# Patient Record
Sex: Male | Born: 1935 | Race: Black or African American | Hispanic: No | Marital: Married | Smoking: Never smoker
Health system: Southern US, Community
[De-identification: ages and names within clinical notes are randomized; demographics above are authoritative.]

## PROBLEM LIST (undated history)

## (undated) DIAGNOSIS — I5042 Chronic combined systolic (congestive) and diastolic (congestive) heart failure: Secondary | ICD-10-CM

## (undated) DIAGNOSIS — E785 Hyperlipidemia, unspecified: Secondary | ICD-10-CM

## (undated) DIAGNOSIS — I10 Essential (primary) hypertension: Secondary | ICD-10-CM

## (undated) DIAGNOSIS — I6529 Occlusion and stenosis of unspecified carotid artery: Secondary | ICD-10-CM

## (undated) DIAGNOSIS — G453 Amaurosis fugax: Secondary | ICD-10-CM

## (undated) HISTORY — DX: Essential (primary) hypertension: I10

## (undated) HISTORY — PX: OTHER SURGICAL HISTORY: SHX169

## (undated) HISTORY — DX: Chronic combined systolic (congestive) and diastolic (congestive) heart failure: I50.42

## (undated) HISTORY — DX: Amaurosis fugax: G45.3

## (undated) HISTORY — DX: Occlusion and stenosis of unspecified carotid artery: I65.29

## (undated) HISTORY — DX: Hyperlipidemia, unspecified: E78.5

## (undated) HISTORY — PX: EYE SURGERY: SHX253

---

## 1999-02-13 ENCOUNTER — Encounter: Payer: Self-pay | Admitting: Emergency Medicine

## 1999-02-13 ENCOUNTER — Emergency Department (HOSPITAL_COMMUNITY): Admission: EM | Admit: 1999-02-13 | Discharge: 1999-02-13 | Payer: Self-pay | Admitting: Emergency Medicine

## 2000-10-14 ENCOUNTER — Ambulatory Visit (HOSPITAL_COMMUNITY): Admission: RE | Admit: 2000-10-14 | Discharge: 2000-10-14 | Payer: Self-pay | Admitting: Gastroenterology

## 2011-06-25 ENCOUNTER — Ambulatory Visit
Admission: RE | Admit: 2011-06-25 | Discharge: 2011-06-25 | Disposition: A | Discharge status: Home / Self Care | Payer: Medicare Other | Source: Ambulatory Visit | Attending: Family Medicine | Admitting: Family Medicine

## 2011-06-25 ENCOUNTER — Other Ambulatory Visit: Payer: Self-pay | Admitting: Family Medicine

## 2011-06-25 DIAGNOSIS — M79669 Pain in unspecified lower leg: Secondary | ICD-10-CM

## 2012-01-12 ENCOUNTER — Other Ambulatory Visit: Payer: Self-pay | Admitting: Gastroenterology

## 2013-09-08 ENCOUNTER — Encounter (INDEPENDENT_AMBULATORY_CARE_PROVIDER_SITE_OTHER): Payer: Medicare Other | Admitting: Ophthalmology

## 2013-09-08 DIAGNOSIS — H2510 Age-related nuclear cataract, unspecified eye: Secondary | ICD-10-CM

## 2013-09-08 DIAGNOSIS — I10 Essential (primary) hypertension: Secondary | ICD-10-CM

## 2013-09-08 DIAGNOSIS — H3530 Unspecified macular degeneration: Secondary | ICD-10-CM

## 2013-09-08 DIAGNOSIS — H35039 Hypertensive retinopathy, unspecified eye: Secondary | ICD-10-CM

## 2013-09-08 DIAGNOSIS — H43819 Vitreous degeneration, unspecified eye: Secondary | ICD-10-CM

## 2013-09-08 DIAGNOSIS — H35379 Puckering of macula, unspecified eye: Secondary | ICD-10-CM

## 2014-03-15 ENCOUNTER — Ambulatory Visit (INDEPENDENT_AMBULATORY_CARE_PROVIDER_SITE_OTHER): Payer: Medicare Other | Admitting: Ophthalmology

## 2014-06-12 ENCOUNTER — Ambulatory Visit (INDEPENDENT_AMBULATORY_CARE_PROVIDER_SITE_OTHER): Payer: Medicare Other | Admitting: Family Medicine

## 2014-06-12 VITALS — BP 134/78 | HR 81 | Temp 97.8°F | Resp 18 | Ht 72.0 in | Wt 176.0 lb

## 2014-06-12 DIAGNOSIS — R0981 Nasal congestion: Secondary | ICD-10-CM

## 2014-06-12 MED ORDER — IPRATROPIUM BROMIDE 0.03 % NA SOLN: 2.0000 | Freq: Four times a day (QID) | NASAL | Status: DC

## 2014-06-12 MED ORDER — GUAIFENESIN 100 MG/5ML PO SOLN: 15.0000 mL | ORAL | Status: DC | PRN

## 2014-06-12 MED ORDER — AZITHROMYCIN 250 MG PO TABS: ORAL_TABLET | ORAL | Status: DC

## 2014-06-12 MED ORDER — HYDROCOD POLST-CHLORPHEN POLST 10-8 MG/5ML PO LQCR: 2.5000 mL | Freq: Every evening | ORAL | Status: DC | PRN

## 2014-06-12 MED ORDER — FLUTICASONE PROPIONATE 50 MCG/ACT NA SUSP: 2.0000 | Freq: Every day | NASAL | Status: DC

## 2014-06-12 NOTE — Progress Notes (Signed)
Subjective:  This chart was scribed for Dr. Norberto SorensonEva Ladashia Demarinis, MD by Jarvis Morganaylor Ferguson, ED Scribe. This patient was seen in Room 10 and the patient's care was started at 4:35 PM.   Patient ID: Stephen ReidJoe L Chavez, male    DOB: 1932-06-21, 80 y.o.   MRN: [id number]  Chief Complaint  Patient presents with  . Nasal Congestion    since wednesday     HPI HPI Comments: Stephen Chavez is a 80 y.o. male who presents to the [facility] complaining of nasal congestion for 5 days. He has had associated sneezing, sinus pressure, post nasal drip, body aches, mild sore throat, mild dry cough. He notes a touch of blood in the mucus when he blows his nose but not much. Pt has been using Mucinex and Afrin nasal spray with mild relief. Pt denies any trouble sleeping or his symptoms keeping him up at night. He denies any fever, chills, otalgia, or shortness of breath. Pt is a former smoker and has not smoker in over 20 years.   Past Medical History  Diagnosis Date  . Hypertension   . Hyperlipidemia    No current outpatient prescriptions on file prior to visit.   No current facility-administered medications on file prior to visit.   No Known Allergies    Review of Systems  Constitutional: Negative for fever and chills.  HENT: Positive for congestion, postnasal drip, rhinorrhea, sinus pressure, sneezing and sore throat (mild). Negative for ear pain and hearing loss.   Respiratory: Positive for cough (mild and non productive). Negative for shortness of breath.   Musculoskeletal: Positive for myalgias (generalized body aches).     Triage Vitals: BP 134/78 mmHg  Pulse 81  Temp(Src) 97.8 F (36.6 C) (Oral)  Resp 18  Ht 6' (1.829 m)  Wt 176 lb (79.833 kg)  BMI 23.86 kg/m2  SpO2 98%  Objective:   Physical Exam  Constitutional: He is oriented to person, place, and time. He appears well-developed and well-nourished. No distress.  HENT:  Head: Normocephalic and atraumatic.  Right Ear: Tympanic  membrane, external ear and ear canal normal.  Left Ear: Tympanic membrane, external ear and ear canal normal.  Nose: Mucosal edema present. Right sinus exhibits no maxillary sinus tenderness and no frontal sinus tenderness. Left sinus exhibits no maxillary sinus tenderness and no frontal sinus tenderness.  Mouth/Throat: Oropharynx is clear and moist. No oropharyngeal exudate, posterior oropharyngeal edema or posterior oropharyngeal erythema.  Nasal mucosa erythema with small amount of rhinitis.   Eyes: Conjunctivae and EOM are normal.  Neck: Neck supple. No tracheal deviation present. No thyroid mass and no thyromegaly present.  Cardiovascular: Normal rate, regular rhythm, S1 normal, S2 normal and normal heart sounds.   Pulmonary/Chest: Effort normal and breath sounds normal. No respiratory distress. He has no wheezes. He has no rhonchi. He has no rales.  Musculoskeletal: Normal range of motion.  Lymphadenopathy:    He has no cervical adenopathy.  Neurological: He is alert and oriented to person, place, and time.  Skin: Skin is warm and dry.  Psychiatric: He has a normal mood and affect. His behavior is normal.  Nursing note and vitals reviewed.      Assessment & Plan:   Nasal sinus congestion Try netti pot/sinus rinse along with flonase x 2 wks - suspect due to viral or allergies at this point but if develops sxs of bacterial sinus infection over next few days, then pt will fill paper rx for zpack.  Meds ordered this encounter  Medications  . aspirin EC 81 MG tablet    Sig: Take 81 mg by mouth daily.  Marland Kitchen. amLODipine (NORVASC) 10 MG tablet    Sig: Take 10 mg by mouth daily.  . pravastatin (PRAVACHOL) 40 MG tablet    Sig: Take 40 mg by mouth daily.  Marland Kitchen. ipratropium (ATROVENT) 0.03 % nasal spray    Sig: Place 2 sprays into the nose 4 (four) times daily.    Dispense:  30 mL    Refill:  0  . guaiFENesin (ROBITUSSIN) 100 MG/5ML SOLN    Sig: Take 15 mLs (300 mg total) by mouth every 4 (four)  hours as needed for to loosen phlegm.    Dispense:  1200 mL    Refill:  1  . chlorpheniramine-HYDROcodone (TUSSIONEX PENNKINETIC ER) 10-8 MG/5ML LQCR    Sig: Take 2.5 mLs by mouth at bedtime as needed.    Dispense:  45 mL    Refill:  0  . fluticasone (FLONASE) 50 MCG/ACT nasal spray    Sig: Place 2 sprays into both nostrils at bedtime.    Dispense:  16 g    Refill:  2  . azithromycin (ZITHROMAX) 250 MG tablet    Sig: Take 2 tabs PO x 1 dose, then 1 tab PO QD x 4 days    Dispense:  6 tablet    Refill:  0    I personally performed the services described in this documentation, which was scribed in my presence. The recorded information has been reviewed and considered, and addended by me as needed.  Norberto SorensonEva Mkenzie Dotts, MD MPH

## 2014-06-12 NOTE — Patient Instructions (Addendum)
Hot showers or breathing in steam may help loosen the congestion.  Using a netti pot or sinus rinse is also likely to help you feel better and keep this from progressing.  Use the atrovent nasal spray as needed throughout the day and use the fluticasone nasal spray every night before bed for at least 2 weeks.  I recommend augmenting generic mucinex to help you move out the congestion.  If no improvement or you are getting worse, come back as you might need a course of steroids but hopefully with all of the above, you can avoid it.  If you develop fevers, chills, or symptoms continue to persist after another 3 days, go ahead and fill the zpack (azithromycin antibiotic).   Upper Respiratory Infection, Adult An upper respiratory infection (URI) is also sometimes known as the common cold. The upper respiratory tract includes the nose, sinuses, throat, trachea, and bronchi. Bronchi are the airways leading to the lungs. Most people improve within 1 week, but symptoms can last up to 2 weeks. A residual cough may last even longer.  CAUSES Many different viruses can infect the tissues lining the upper respiratory tract. The tissues become irritated and inflamed and often become very moist. Mucus production is also common. A cold is contagious. You can easily spread the virus to others by oral contact. This includes kissing, sharing a glass, coughing, or sneezing. Touching your mouth or nose and then touching a surface, which is then touched by another person, can also spread the virus. SYMPTOMS  Symptoms typically develop 1 to 3 days after you come in contact with a cold virus. Symptoms vary from person to person. They may include:  Runny nose.  Sneezing.  Nasal congestion.  Sinus irritation.  Sore throat.  Loss of voice (laryngitis).  Cough.  Fatigue.  Muscle aches.  Loss of appetite.  Headache.  Low-grade fever. DIAGNOSIS  You might diagnose your own cold based on familiar symptoms, since  most people get a cold 2 to 3 times a year. Your caregiver can confirm this based on your exam. Most importantly, your caregiver can check that your symptoms are not due to another disease such as strep throat, sinusitis, pneumonia, asthma, or epiglottitis. Blood tests, throat tests, and X-rays are not necessary to diagnose a common cold, but they may sometimes be helpful in excluding other more serious diseases. Your caregiver will decide if any further tests are required. RISKS AND COMPLICATIONS  You may be at risk for a more severe case of the common cold if you smoke cigarettes, have chronic heart disease (such as heart failure) or lung disease (such as asthma), or if you have a weakened immune system. The very young and very old are also at risk for more serious infections. Bacterial sinusitis, middle ear infections, and bacterial pneumonia can complicate the common cold. The common cold can worsen asthma and chronic obstructive pulmonary disease (COPD). Sometimes, these complications can require emergency medical care and may be life-threatening. PREVENTION  The best way to protect against getting a cold is to practice good hygiene. Avoid oral or hand contact with people with cold symptoms. Wash your hands often if contact occurs. There is no clear evidence that vitamin C, vitamin E, echinacea, or exercise reduces the chance of developing a cold. However, it is always recommended to get plenty of rest and practice good nutrition. TREATMENT  Treatment is directed at relieving symptoms. There is no cure. Antibiotics are not effective, because the infection is caused by  a virus, not by bacteria. Treatment may include:  Increased fluid intake. Sports drinks offer valuable electrolytes, sugars, and fluids.  Breathing heated mist or steam (vaporizer or shower).  Eating chicken soup or other clear broths, and maintaining good nutrition.  Getting plenty of rest.  Using gargles or lozenges for  comfort.  Controlling fevers with ibuprofen or acetaminophen as directed by your caregiver.  Increasing usage of your inhaler if you have asthma. Zinc gel and zinc lozenges, taken in the first 24 hours of the common cold, can shorten the duration and lessen the severity of symptoms. Pain medicines may help with fever, muscle aches, and throat pain. A variety of non-prescription medicines are available to treat congestion and runny nose. Your caregiver can make recommendations and may suggest nasal or lung inhalers for other symptoms.  HOME CARE INSTRUCTIONS   Only take over-the-counter or prescription medicines for pain, discomfort, or fever as directed by your caregiver.  Use a warm mist humidifier or inhale steam from a shower to increase air moisture. This may keep secretions moist and make it easier to breathe.  Drink enough water and fluids to keep your urine clear or pale yellow.  Rest as needed.  Return to work when your temperature has returned to normal or as your caregiver advises. You may need to stay home longer to avoid infecting others. You can also use a face mask and careful hand washing to prevent spread of the virus. SEEK MEDICAL CARE IF:   After the first few days, you feel you are getting worse rather than better.  You need your caregiver's advice about medicines to control symptoms.  You develop chills, worsening shortness of breath, or brown or red sputum. These may be signs of pneumonia.  You develop yellow or brown nasal discharge or pain in the face, especially when you bend forward. These may be signs of sinusitis.  You develop a fever, swollen neck glands, pain with swallowing, or white areas in the back of your throat. These may be signs of strep throat. SEEK IMMEDIATE MEDICAL CARE IF:   You have a fever.  You develop severe or persistent headache, ear pain, sinus pain, or chest pain.  You develop wheezing, a prolonged cough, cough up blood, or have a  change in your usual mucus (if you have chronic lung disease).  You develop sore muscles or a stiff neck. Document Released: 11/26/2000 Document Revised: 08/25/2011 Document Reviewed: 09/07/2013 [facility] Patient Information 2015 [place], [place]. This information is not intended to replace advice given to you by your health care provider. Make sure you discuss any questions you have with your health care provider.

## 2016-05-15 ENCOUNTER — Other Ambulatory Visit: Payer: Self-pay | Admitting: Family Medicine

## 2016-05-15 DIAGNOSIS — H53121 Transient visual loss, right eye: Secondary | ICD-10-CM

## 2016-05-21 ENCOUNTER — Encounter (INDEPENDENT_AMBULATORY_CARE_PROVIDER_SITE_OTHER): Payer: Medicare Other | Admitting: Ophthalmology

## 2016-05-21 DIAGNOSIS — I10 Essential (primary) hypertension: Secondary | ICD-10-CM | POA: Diagnosis not present

## 2016-05-21 DIAGNOSIS — H5340 Unspecified visual field defects: Secondary | ICD-10-CM

## 2016-05-21 DIAGNOSIS — H43822 Vitreomacular adhesion, left eye: Secondary | ICD-10-CM

## 2016-05-21 DIAGNOSIS — H5319 Other subjective visual disturbances: Secondary | ICD-10-CM | POA: Diagnosis not present

## 2016-05-21 DIAGNOSIS — H353111 Nonexudative age-related macular degeneration, right eye, early dry stage: Secondary | ICD-10-CM | POA: Diagnosis not present

## 2016-05-21 DIAGNOSIS — H43813 Vitreous degeneration, bilateral: Secondary | ICD-10-CM | POA: Diagnosis not present

## 2016-05-21 DIAGNOSIS — H35033 Hypertensive retinopathy, bilateral: Secondary | ICD-10-CM | POA: Diagnosis not present

## 2016-05-23 ENCOUNTER — Ambulatory Visit
Admission: RE | Admit: 2016-05-23 | Discharge: 2016-05-23 | Disposition: A | Discharge status: Home / Self Care | Payer: Medicare Other | Source: Ambulatory Visit | Attending: Family Medicine | Admitting: Family Medicine

## 2016-05-23 DIAGNOSIS — H53121 Transient visual loss, right eye: Secondary | ICD-10-CM

## 2016-06-02 ENCOUNTER — Encounter (INDEPENDENT_AMBULATORY_CARE_PROVIDER_SITE_OTHER): Payer: Self-pay | Admitting: Ophthalmology

## 2016-06-05 DIAGNOSIS — G453 Amaurosis fugax: Secondary | ICD-10-CM | POA: Insufficient documentation

## 2016-08-02 ENCOUNTER — Ambulatory Visit (INDEPENDENT_AMBULATORY_CARE_PROVIDER_SITE_OTHER): Payer: Medicare Other | Admitting: Family Medicine

## 2016-08-02 VITALS — BP 120/60 | HR 77 | Temp 98.3°F | Resp 16 | Ht 72.0 in | Wt 177.0 lb

## 2016-08-02 DIAGNOSIS — J069 Acute upper respiratory infection, unspecified: Secondary | ICD-10-CM

## 2016-08-02 DIAGNOSIS — B9789 Other viral agents as the cause of diseases classified elsewhere: Secondary | ICD-10-CM

## 2016-08-02 DIAGNOSIS — H9319 Tinnitus, unspecified ear: Secondary | ICD-10-CM | POA: Diagnosis not present

## 2016-08-02 DIAGNOSIS — K5909 Other constipation: Secondary | ICD-10-CM | POA: Diagnosis not present

## 2016-08-02 LAB — POCT INFLUENZA A/B
INFLUENZA B, POC: NEGATIVE
Influenza A, POC: NEGATIVE

## 2016-08-02 MED ORDER — IPRATROPIUM BROMIDE 0.03 % NA SOLN: 2.0000 | Freq: Four times a day (QID) | NASAL | 0 refills | Status: DC

## 2016-08-02 MED ORDER — GUAIFENESIN-CODEINE 100-10 MG/5ML PO SOLN: 5.0000 mL | ORAL | 0 refills | Status: DC | PRN

## 2016-08-02 NOTE — Progress Notes (Signed)
By signing my name below, I, Mesha Guinyard, attest that this documentation has been prepared under the direction and in the presence of Norberto SorensonEva Shaw, MD.  Electronically Signed: Arvilla MarketMesha Guinyard, Medical Scribe. 08/02/16. 10:49 AM.  Subjective:    Patient ID: Stephen Chavez, male    DOB: [birth year], 83 y.o.   MRN: [id number]  HPI Chief Complaint  Patient presents with  . Cough    Hx of pneumonia    HPI Comments: Stephen Chavez is a 83 y.o. male with a PMHx of HTN and tobacco abuse who presents to the [facility] complaining of dry cough onset yesterday morning.  His [facility]CP [facility] [place]. Pt stopped smoking over 25 years prior.  Pt woke up yesterday with a cough which kept him up at night. Reports associated sxs of nasal congestion, frontal sinus pressure, sinus pain, and "feeling wierd". Pt notes having constipation, but suspects it due to a medication and has been taking benefiber for relief of his sxs. Took alka seltzer plus, and BC without relief of his sxs. Pt had his flu shot in Aug, and his PNA shot. He notes the lump on his neck was nl when checked out and it hasn't grown in size. Denies needing an inhaler or having sick contacts. Denies fever, chills, diaphoresis, HA, sore throat, postnasal drip, rhinorrhea, myalgias, arthralgias, difficulty urinating, nausea, and emesis.  Tinnitus: Report hearing a ringing for the past 6-8 months - isn't sure if it's bilateral of not. Pt's wife says he has hearing loss but pt isn't sure if it's true.  There are no active problems to display for this patient.  Past Medical History:  Diagnosis Date  . Hyperlipidemia   . Hypertension    Past Surgical History:  Procedure Laterality Date  . EYE SURGERY    . polyp removal     No Known Allergies Prior to Admission medications   Medication Sig Start Date End Date Taking? Authorizing Provider  amLODipine (NORVASC) 10 MG tablet Take 10 mg by mouth  daily.   Yes Historical Provider, MD  aspirin EC 81 MG tablet Take 81 mg by mouth daily.   Yes Historical Provider, MD  pravastatin (PRAVACHOL) 40 MG tablet Take 40 mg by mouth daily.   Yes Historical Provider, MD  chlorpheniramine-HYDROcodone (TUSSIONEX PENNKINETIC ER) 10-8 MG/5ML LQCR Take 2.5 mLs by mouth at bedtime as needed. Patient not taking: Reported on 08/02/2016 06/12/14   Sherren MochaEva N Shaw, MD   Social History   Social History  . Marital status: Married    Spouse name: N/A  . Number of children: N/A  . Years of education: N/A   Occupational History  . Not on file.   Social History Main Topics  . Smoking status: Never Smoker  . Smokeless tobacco: Never Used  . Alcohol use 2.4 oz/week    4 Standard drinks or equivalent per week  . Drug use: No  . Sexual activity: Not on file   Other Topics Concern  . Not on file   Social History Narrative  . No narrative on file   Depression screen [facility] 2/9 08/02/2016  Decreased Interest 0  Down, Depressed, Hopeless 0  PHQ - 2 Score 0    Review of Systems  Constitutional: Negative for chills, diaphoresis and fever.  HENT: Positive for sinus pain, sinus pressure and tinnitus. Negative for hearing loss, postnasal drip, rhinorrhea and sore throat.   Respiratory: Positive for cough.  Gastrointestinal: Positive for constipation. Negative for nausea and vomiting.  Genitourinary: Negative for difficulty urinating.  Musculoskeletal: Negative for arthralgias and myalgias.  Neurological: Negative for headaches.  Psychiatric/Behavioral: Positive for sleep disturbance.   Objective:  Physical Exam  Constitutional: He appears well-developed and well-nourished. No distress.  HENT:  Head: Normocephalic and atraumatic.  Right Ear: Tympanic membrane, external ear and ear canal normal.  Left Ear: Tympanic membrane, external ear and ear canal normal.  Nose: Nose normal.  Mouth/Throat: Oropharynx is clear and moist.  Eyes: Conjunctivae are normal.    Neck: Neck supple. No thyromegaly present.  Right posterior mass not adherent not overlying skin  Does feel adhering to underlying solid tissue but is soft, well defined, approx 1 cm diameter consistent with lymph node in posterior chain vs cyst  Cardiovascular: Normal rate, regular rhythm, S1 normal, S2 normal and normal heart sounds.   Pulmonary/Chest: Effort normal.  Lymphadenopathy:    He has no cervical adenopathy.  Neurological: He is alert.  Skin: Skin is warm and dry.  Psychiatric: He has a normal mood and affect. His behavior is normal.  Nursing note and vitals reviewed.  BP 120/60   Pulse 77   Temp 98.3 F (36.8 C) (Oral)   Resp 16   Ht 6' (1.829 m)   Wt 177 lb (80.3 kg)   SpO2 97%   BMI 24.01 kg/m    Results for orders placed or performed in visit on 08/02/16  POCT Influenza A/B  Result Value Ref Range   Influenza A, POC Negative Negative   Influenza B, POC Negative Negative   Assessment & Plan:   1. Viral upper respiratory tract infection   2. Tinnitus, unspecified laterality   3. Chronic constipation     Orders Placed This Encounter  Procedures  . Ambulatory referral to ENT    Referral Priority:   Routine    Referral Type:   Consultation    Referral Reason:   Specialty Services Required    Requested Specialty:   Otolaryngology    Number of Visits Requested:   1  . POCT Influenza A/B    Meds ordered this encounter  Medications  . ipratropium (ATROVENT) 0.03 % nasal spray    Sig: Place 2 sprays into the nose 4 (four) times daily.    Dispense:  30 mL    Refill:  0  . guaiFENesin-codeine 100-10 MG/5ML syrup    Sig: Take 5-10 mLs by mouth every 4 (four) hours as needed for cough.    Dispense:  180 mL    Refill:  0    I personally performed the services described in this documentation, which was scribed in my presence. The recorded information has been reviewed and considered, and addended by me as needed.   Norberto Sorenson, M.D.  [facility] at [facility] [address] [place], [place] [address] [phone] phone [phone] fax  08/20/16 4:58 AM

## 2016-08-02 NOTE — Patient Instructions (Addendum)
Restart the ipratropium nasal spray 4 times day.   The cough syrup has some an expectorant to help loosen up the congestion (same as in Mucinex or Robitussen) combined with a pain medication that will help you sleep, treat headache from cough, and suppress cough.  I recommend frequent warm salt water gargles, hot tea with honey and lemon, rest, and handwashing.  Hot showers or breathing in steam may help loosen the congestion.  Try frequent nasal saline throughout the day also likely to help you feel better and keep this from progressing.  Use tylenol/acetaminophen as needed for sinus pressure/pain or headache.    IF you received an x-ray today, you will receive an invoice from [facility]. Please contact [facility] at [phone] with questions or concerns regarding your invoice.   IF you received labwork today, you will receive an invoice from [facility]. Please contact [facility] at [phone] with questions or concerns regarding your invoice.   Our billing staff will not be able to assist you with questions regarding bills from these companies.  You will be contacted with the lab results as soon as they are available. The fastest way to get your results is to activate your My Chart account. Instructions are located on the last page of this paperwork. If you have not heard from us regarding the results in 2 weeks, please contact this office.     Upper Respiratory Infection, Adult Most upper respiratory infections (URIs) are a viral infection of the air passages leading to the lungs. A URI affects the nose, throat, and upper air passages. The most common type of URI is nasopharyngitis and is typically referred to as "the common cold." URIs run their course and usually go away on their own. Most of the time, a URI does not require medical attention, but sometimes a bacterial infection in the upper airways can follow a viral infection. This is called a secondary infection. Sinus  and middle ear infections are common types of secondary upper respiratory infections. Bacterial pneumonia can also complicate a URI. A URI can worsen asthma and chronic obstructive pulmonary disease (COPD). Sometimes, these complications can require emergency medical care and may be life threatening. What are the causes? Almost all URIs are caused by viruses. A virus is a type of germ and can spread from one person to another. What increases the risk? You may be at risk for a URI if:  You smoke.  You have chronic heart or lung disease.  You have a weakened defense (immune) system.  You are very young or very old.  You have nasal allergies or asthma.  You work in crowded or poorly ventilated areas.  You work in health care facilities or schools. What are the signs or symptoms? Symptoms typically develop 2-3 days after you come in contact with a cold virus. Most viral URIs last 7-10 days. However, viral URIs from the influenza virus (flu virus) can last 14-18 days and are typically more severe. Symptoms may include:  Runny or stuffy (congested) nose.  Sneezing.  Cough.  Sore throat.  Headache.  Fatigue.  Fever.  Loss of appetite.  Pain in your forehead, behind your eyes, and over your cheekbones (sinus pain).  Muscle aches. How is this diagnosed? Your health care provider may diagnose a URI by:  Physical exam.  Tests to check that your symptoms are not due to another condition such as:  Strep throat.  Sinusitis.  Pneumonia.  Asthma. How is this treated? A URI goes  away on its own with time. It cannot be cured with medicines, but medicines may be prescribed or recommended to relieve symptoms. Medicines may help:  Reduce your fever.  Reduce your cough.  Relieve nasal congestion. Follow these instructions at home:  Take medicines only as directed by your health care provider.  Gargle warm saltwater or take cough drops to comfort your throat as directed  by your health care provider.  Use a warm mist humidifier or inhale steam from a shower to increase air moisture. This may make it easier to breathe.  Drink enough fluid to keep your urine clear or pale yellow.  Eat soups and other clear broths and maintain good nutrition.  Rest as needed.  Return to work when your temperature has returned to normal or as your health care provider advises. You may need to stay home longer to avoid infecting others. You can also use a face mask and careful hand washing to prevent spread of the virus.  Increase the usage of your inhaler if you have asthma.  Do not use any tobacco products, including cigarettes, chewing tobacco, or electronic cigarettes. If you need help quitting, ask your health care provider. How is this prevented? The best way to protect yourself from getting a cold is to practice good hygiene.  Avoid oral or hand contact with people with cold symptoms.  Wash your hands often if contact occurs. There is no clear evidence that vitamin C, vitamin E, echinacea, or exercise reduces the chance of developing a cold. However, it is always recommended to get plenty of rest, exercise, and practice good nutrition. Contact a health care provider if:  You are getting worse rather than better.  Your symptoms are not controlled by medicine.  You have chills.  You have worsening shortness of breath.  You have brown or red mucus.  You have yellow or brown nasal discharge.  You have pain in your face, especially when you bend forward.  You have a fever.  You have swollen neck glands.  You have pain while swallowing.  You have white areas in the back of your throat. Get help right away if:  You have severe or persistent:  Headache.  Ear pain.  Sinus pain.  Chest pain.  You have chronic lung disease and any of the following:  Wheezing.  Prolonged cough.  Coughing up blood.  A change in your usual mucus.  You have a  stiff neck.  You have changes in your:  Vision.  Hearing.  Thinking.  Mood. This information is not intended to replace advice given to you by your health care provider. Make sure you discuss any questions you have with your health care provider. Document Released: 11/26/2000 Document Revised: 02/03/2016 Document Reviewed: 09/07/2013 Elsevier Interactive Patient Education  2017 [employer].

## 2016-08-15 ENCOUNTER — Other Ambulatory Visit: Payer: Self-pay | Admitting: Family Medicine

## 2016-08-18 ENCOUNTER — Telehealth: Payer: Self-pay

## 2016-08-18 ENCOUNTER — Other Ambulatory Visit: Payer: Self-pay | Admitting: Family Medicine

## 2016-08-18 ENCOUNTER — Telehealth: Payer: Self-pay | Admitting: Cardiovascular Disease

## 2016-08-18 DIAGNOSIS — G453 Amaurosis fugax: Secondary | ICD-10-CM

## 2016-08-18 NOTE — Telephone Encounter (Signed)
Notes faxed to [facility] on 08/15/16

## 2016-08-18 NOTE — Telephone Encounter (Signed)
Received records from [facility] for appointment on 09/03/16 with Dr [place].  Records put with Dr Scott's schedule for 09/03/16. lp °

## 2016-09-03 ENCOUNTER — Encounter: Payer: Self-pay | Admitting: Cardiovascular Disease

## 2016-09-03 ENCOUNTER — Ambulatory Visit (INDEPENDENT_AMBULATORY_CARE_PROVIDER_SITE_OTHER): Payer: Medicare Other | Admitting: Cardiovascular Disease

## 2016-09-03 VITALS — BP 134/72 | HR 65 | Ht 72.0 in | Wt 176.0 lb

## 2016-09-03 DIAGNOSIS — G453 Amaurosis fugax: Secondary | ICD-10-CM

## 2016-09-03 DIAGNOSIS — I10 Essential (primary) hypertension: Secondary | ICD-10-CM

## 2016-09-03 DIAGNOSIS — G459 Transient cerebral ischemic attack, unspecified: Secondary | ICD-10-CM | POA: Diagnosis not present

## 2016-09-03 DIAGNOSIS — I6529 Occlusion and stenosis of unspecified carotid artery: Secondary | ICD-10-CM

## 2016-09-03 DIAGNOSIS — I6523 Occlusion and stenosis of bilateral carotid arteries: Secondary | ICD-10-CM | POA: Diagnosis not present

## 2016-09-03 HISTORY — DX: Amaurosis fugax: G45.3

## 2016-09-03 HISTORY — DX: Occlusion and stenosis of unspecified carotid artery: I65.29

## 2016-09-03 HISTORY — DX: Essential (primary) hypertension: I10

## 2016-09-03 MED ORDER — AMLODIPINE BESYLATE 5 MG PO TABS
7.5000 mg | ORAL_TABLET | Freq: Every day | ORAL | 3 refills | Status: DC
Start: 2016-09-03 — End: 2017-11-11

## 2016-09-03 NOTE — Progress Notes (Signed)
Cardiology Office Note   Date:  09/03/2016   ID:  Stephen Chavez, DOB 26-Aug-1932, MRN [id number]  PCP:  Stephen HillierLITTLE,KEVIN LORNE, MD  Cardiologist:   Stephen Siiffany Sequoyah, MD   Chief Complaint  Patient presents with  . New Patient (Initial Visit)    Pt states no Sx.      History of Present Illness: Stephen Chavez is a 83 y.o. male with hypertension, hyperlipidemia, mild carotid stenosis, amaurosis fugax, and Sickle cell trait who presents for cardiovascular evaluation.  Stephen Chavez had transient monocular visual loss in his right eye 05/2016. The episode lasted several minutes. Since then it is have been 3 or 4 times in the same eye. He has been seen by an ophthalmologist and no pathology was noted.  His symptoms were felt to be due to amarourosis fugax and TIA evaluation was recommended.  He had carotid Dopplers that revealed mild carotid stenosis bilaterally.  Aspirin was increased from 81 mg 325 mg daily. He followed up with his PCP, Stephen Chavez, and was referred to cardiology for further evaluation.  Stephen Chavez has been feeling well. He denies chest pain. He has slight shortness of breath with exertion, but he attributes this to his age. He also complains of sinus congestion and drainage. He denies lower extremity edema, orthopnea, or PND. He quit smoking in 1987. Of note, his mother died of bleeding while on Coumadin. He is unsure why she was on this medication. He notes that he sometimes feels unsteady on his feet, especially with changes in position. At his last doctor's appointment amlodipine was reduced from 10 mg to 5 mg due to mild orthostasis. He notes that since this time his blood pressure has been elevated, mostly in the 150s systolic.   Past Medical History:  Diagnosis Date  . Amaurosis fugax of right eye 09/03/2016  . Carotid stenosis 09/03/2016   Mild 05/2016  . Essential hypertension 09/03/2016  . Hyperlipidemia   . Hypertension     Past Surgical History:  Procedure  Laterality Date  . EYE SURGERY    . polyp removal       Current Outpatient Prescriptions  Medication Sig Dispense Refill  . amLODipine (NORVASC) 5 MG tablet Take 1.5 tablets (7.5 mg total) by mouth daily. 135 tablet 3  . aspirin EC 81 MG tablet Take 81 mg by mouth 3 (three) times daily.     . pravastatin (PRAVACHOL) 40 MG tablet Take 40 mg by mouth daily.     No current facility-administered medications for this visit.     Allergies:   Patient has no known allergies.    Social History:  The patient  reports that he has never smoked. He has never used smokeless tobacco. He reports that he drinks about 2.4 oz of alcohol per week . He reports that he does not use drugs.   Family History:  The patient's family history is not on file.    ROS:  Please see the history of present illness.   Otherwise, review of systems are positive for none.   All other systems are reviewed and negative.    PHYSICAL EXAM: VS:  BP 134/72   Pulse 65   Ht 6' (1.829 m)   Wt 79.8 kg (176 lb)   BMI 23.87 kg/m  , BMI Body mass index is 23.87 kg/m. GENERAL:  Well appearing HEENT:  Pupils equal round and reactive, fundi not visualized, oral mucosa unremarkable NECK:  No jugular venous distention, waveform  within normal limits, carotid upstroke brisk and symmetric, no bruits, no thyromegaly LYMPHATICS:  No cervical adenopathy LUNGS:  Clear to auscultation bilaterally HEART:  RRR.  PMI not displaced or sustained,S1 and S2 within normal limits, no S3, no S4, no clicks, no rubs, no murmurs ABD:  Flat, positive bowel sounds normal in frequency in pitch, no bruits, no rebound, no guarding, no midline pulsatile mass, no hepatomegaly, no splenomegaly EXT:  2 plus pulses throughout, no edema, no cyanosis no clubbing SKIN:  No rashes no nodules NEURO:  Cranial nerves II through XII grossly intact, motor grossly intact throughout PSYCH:  Cognitively intact, oriented to person place and time    EKG:  EKG is  ordered today. The ekg ordered today demonstrates sinus rhythm.  Rate 65 bpm.   Recent Labs: No results found for requested labs within last 8760 hours.    Lipid Panel No results found for: CHOL, TRIG, HDL, CHOLHDL, VLDL, LDLCALC, LDLDIRECT    Wt Readings from Last 3 Encounters:  09/03/16 79.8 kg (176 lb)  08/02/16 80.3 kg (177 lb)  06/12/14 79.8 kg (176 lb)      ASSESSMENT AND PLAN:  # Transient monocular blindness:  # Hyperlipidemia: Stephen Chavez has experienced several episodes of transient blindness in the same eye. It is unlikely that this is due to an embolic source. This typically would not occur with the same exact presentation each time. We will obtain a transthoracic echo with saline microcavitation study. He has an MRI pending. If his MRI is consistent with ischemic strokes we will order a TEE.  We will also obtain a 30 day event monitor to evaluate for atrial fibrillation. He artery had carotid Dopplers checked that showed moderate disease bilaterally. He is on aspirin and statin. His LDL goal should be less than 70. His most recent lipids are not available at this time.   # Hypertension: Blood pressure is poorly-controlled today. He has struggled with some orthostasis at home. His blood pressure was low when on 10 mg of amlodipine. We will increase the dose from 5 mg to 7.5 mg daily.    Current medicines are reviewed at length with the patient today.  The patient does not have concerns regarding medicines.  The following changes have been made:  no change  Labs/ tests ordered today include:   Orders Placed This Encounter  Procedures  . Cardiac event monitor  . EKG 12-Lead  . ECHOCARDIOGRAM COMPLETE BUBBLE STUDY     Disposition:   FU with Stephen Chavez C. Duke Salvia, MD, [facility] in 2 months.     This note was written with the assistance of speech recognition software.  Please excuse any transcriptional errors.  Signed, Stephen Chavez C. Duke Salvia, MD, [facility] [place]  09/03/2016 5:22 PM     [facility]

## 2016-09-03 NOTE — Patient Instructions (Addendum)
Medication Instructions: Amlodipine has been decreased to 7.5 mg (one and half tablets) daily   Procedures/Testing: Your physician has recommended that you wear an 30 day event monitor. Event monitors are medical devices that record the heart's electrical activity. Doctors most often us these monitors to diagnose arrhythmias. Arrhythmias are problems with the speed or rhythm of the heartbeat. The monitor is a small, portable device. You can wear one while you do your normal daily activities. This is usually used to diagnose what is causing palpitations/syncope (passing out). This will be [address] at [address]   Your physician has requested that you have an echocardiogram bubble study. Echocardiography is a painless test that uses sound waves to create images of your heart. It provides your doctor with information about the size and shape of your heart and how well your heart's chambers and valves are working. This procedure takes approximately one hour. There are no restrictions for this procedure.     Follow up after the studies have been completed   If you need a refill on your cardiac medications before your next appointment, please call your pharmacy.

## 2016-09-18 ENCOUNTER — Other Ambulatory Visit: Payer: Self-pay | Admitting: Family Medicine

## 2016-09-18 DIAGNOSIS — Z77018 Contact with and (suspected) exposure to other hazardous metals: Secondary | ICD-10-CM

## 2016-09-19 ENCOUNTER — Ambulatory Visit
Admission: RE | Admit: 2016-09-19 | Discharge: 2016-09-19 | Disposition: A | Discharge status: Home / Self Care | Payer: Medicare Other | Source: Ambulatory Visit | Attending: Family Medicine | Admitting: Family Medicine

## 2016-09-19 DIAGNOSIS — G453 Amaurosis fugax: Secondary | ICD-10-CM

## 2016-09-19 DIAGNOSIS — Z77018 Contact with and (suspected) exposure to other hazardous metals: Secondary | ICD-10-CM

## 2016-09-23 ENCOUNTER — Ambulatory Visit (INDEPENDENT_AMBULATORY_CARE_PROVIDER_SITE_OTHER): Payer: Medicare Other

## 2016-09-23 ENCOUNTER — Other Ambulatory Visit: Payer: Self-pay

## 2016-09-23 ENCOUNTER — Ambulatory Visit (HOSPITAL_COMMUNITY): Payer: Medicare Other | Attending: Internal Medicine

## 2016-09-23 ENCOUNTER — Other Ambulatory Visit: Payer: Self-pay | Admitting: Cardiovascular Disease

## 2016-09-23 DIAGNOSIS — I119 Hypertensive heart disease without heart failure: Secondary | ICD-10-CM | POA: Insufficient documentation

## 2016-09-23 DIAGNOSIS — G453 Amaurosis fugax: Secondary | ICD-10-CM

## 2016-09-23 DIAGNOSIS — D573 Sickle-cell trait: Secondary | ICD-10-CM | POA: Insufficient documentation

## 2016-09-23 DIAGNOSIS — I4891 Unspecified atrial fibrillation: Secondary | ICD-10-CM | POA: Diagnosis not present

## 2016-09-23 DIAGNOSIS — I6523 Occlusion and stenosis of bilateral carotid arteries: Secondary | ICD-10-CM | POA: Diagnosis not present

## 2016-09-23 DIAGNOSIS — G459 Transient cerebral ischemic attack, unspecified: Secondary | ICD-10-CM

## 2016-09-23 DIAGNOSIS — I358 Other nonrheumatic aortic valve disorders: Secondary | ICD-10-CM | POA: Diagnosis not present

## 2016-09-23 DIAGNOSIS — Z8673 Personal history of transient ischemic attack (TIA), and cerebral infarction without residual deficits: Secondary | ICD-10-CM | POA: Diagnosis present

## 2016-09-23 DIAGNOSIS — I361 Nonrheumatic tricuspid (valve) insufficiency: Secondary | ICD-10-CM | POA: Insufficient documentation

## 2016-10-08 ENCOUNTER — Telehealth: Payer: Self-pay | Admitting: *Deleted

## 2016-10-08 DIAGNOSIS — R9430 Abnormal result of cardiovascular function study, unspecified: Secondary | ICD-10-CM

## 2016-10-08 DIAGNOSIS — I517 Cardiomegaly: Secondary | ICD-10-CM

## 2016-10-08 DIAGNOSIS — I071 Rheumatic tricuspid insufficiency: Secondary | ICD-10-CM

## 2016-10-08 NOTE — Telephone Encounter (Signed)
-----   Message from Chilton Si, MD sent at 10/05/2016  9:45 PM EDT ----- Echo shows that his heart squeezes well but does not relax completely.  This is a mild change and will not cause symptoms unless it worsens.  It will be important to keep his blood pressure under good control. Moderate leaking of the tricuspid valve.  Heart muscle is a little weak.  We should get a Lexiscan Myoview to make sure he doesn't have blockages in the heart arteries.

## 2016-10-08 NOTE — Telephone Encounter (Signed)
Advised patient of results  Sent to scheduling to arrange [facility]

## 2016-10-09 ENCOUNTER — Telehealth: Payer: Self-pay | Admitting: Cardiovascular Disease

## 2016-10-09 NOTE — Telephone Encounter (Signed)
Called the patient and left a VM with my name and number to call me back to schedule his stress test.

## 2016-10-14 ENCOUNTER — Telehealth (HOSPITAL_COMMUNITY): Payer: Self-pay

## 2016-10-14 NOTE — Telephone Encounter (Signed)
Encounter complete. 

## 2016-10-15 ENCOUNTER — Telehealth (HOSPITAL_COMMUNITY): Payer: Self-pay

## 2016-10-15 NOTE — Telephone Encounter (Signed)
Encounter complete. 

## 2016-10-16 ENCOUNTER — Ambulatory Visit (HOSPITAL_COMMUNITY)
Admission: RE | Admit: 2016-10-16 | Discharge: 2016-10-16 | Disposition: A | Discharge status: Home / Self Care | Payer: Medicare Other | Source: Ambulatory Visit | Attending: Cardiology | Admitting: Cardiology

## 2016-10-16 DIAGNOSIS — I071 Rheumatic tricuspid insufficiency: Secondary | ICD-10-CM | POA: Diagnosis not present

## 2016-10-16 DIAGNOSIS — R9430 Abnormal result of cardiovascular function study, unspecified: Secondary | ICD-10-CM | POA: Diagnosis not present

## 2016-10-16 DIAGNOSIS — I517 Cardiomegaly: Secondary | ICD-10-CM

## 2016-10-16 LAB — MYOCARDIAL PERFUSION IMAGING
CHL CUP NUCLEAR SDS: 4
CHL CUP NUCLEAR SRS: 6
CHL CUP NUCLEAR SSS: 8
LV dias vol: 125 mL (ref 62–150)
LV sys vol: 63 mL
NUC STRESS TID: 0.85
Peak HR: 82 {beats}/min
Rest HR: 60 {beats}/min

## 2016-10-16 MED ORDER — TECHNETIUM TC 99M TETROFOSMIN IV KIT
10.6000 | PACK | Freq: Once | INTRAVENOUS | Status: AC | PRN
  Administered 2016-10-16: 10.6 via INTRAVENOUS
  Filled 2016-10-16: qty 11

## 2016-10-16 MED ORDER — TECHNETIUM TC 99M TETROFOSMIN IV KIT
30.4000 | PACK | Freq: Once | INTRAVENOUS | Status: AC | PRN
  Administered 2016-10-16: 30.4 via INTRAVENOUS
  Filled 2016-10-16: qty 31

## 2016-10-16 MED ORDER — REGADENOSON 0.4 MG/5ML IV SOLN
0.4000 mg | Freq: Once | INTRAVENOUS | Status: AC
  Administered 2016-10-16: 0.4 mg via INTRAVENOUS

## 2016-10-23 ENCOUNTER — Encounter: Payer: Self-pay | Admitting: Cardiovascular Disease

## 2016-10-23 ENCOUNTER — Ambulatory Visit (INDEPENDENT_AMBULATORY_CARE_PROVIDER_SITE_OTHER): Payer: Medicare Other | Admitting: Cardiovascular Disease

## 2016-10-23 VITALS — BP 136/73 | HR 74 | Ht 72.0 in | Wt 172.8 lb

## 2016-10-23 DIAGNOSIS — I5042 Chronic combined systolic (congestive) and diastolic (congestive) heart failure: Secondary | ICD-10-CM | POA: Diagnosis not present

## 2016-10-23 DIAGNOSIS — I5032 Chronic diastolic (congestive) heart failure: Secondary | ICD-10-CM | POA: Insufficient documentation

## 2016-10-23 DIAGNOSIS — G453 Amaurosis fugax: Secondary | ICD-10-CM | POA: Diagnosis not present

## 2016-10-23 DIAGNOSIS — I10 Essential (primary) hypertension: Secondary | ICD-10-CM

## 2016-10-23 DIAGNOSIS — E7800 Pure hypercholesterolemia, unspecified: Secondary | ICD-10-CM | POA: Diagnosis not present

## 2016-10-23 HISTORY — DX: Chronic combined systolic (congestive) and diastolic (congestive) heart failure: I50.42

## 2016-10-23 NOTE — Patient Instructions (Signed)
Medication Instructions:  ?Your physician recommends that you continue on your current medications as directed. Please refer to the Current Medication list given to you today.  ? ?Labwork: ?NONE ? ?Testing/Procedures: ?NONE ? ?Follow-Up: ?AS NEEDED  ? ?  ?

## 2016-10-23 NOTE — Progress Notes (Signed)
Cardiology Office Note   Date:  10/23/2016   ID:  Stephen Chavez, DOB 05-11-1933, MRN [id number]  PCP:  [name], MD  Cardiologist:   Chilton Si, MD   Chief Complaint  Patient presents with  . Follow-up    Pt states no Sx.       History of Present Illness: Stephen Chavez is a 83 y.o. male with hypertension, hyperlipidemia, mild carotid stenosis, amaurosis fugax, and Sickle cell trait who presents for follow up.  He was initially seen 08/2016 after several episodes of  transient monocular visual loss in his right eye.  The first episode occurred 05/2016.  He has been seen by an ophthalmologist and no pathology was noted.  His symptoms were felt to be due to amarourosis fugax and TIA evaluation was recommended.  He had carotid Dopplers that revealed mild carotid stenosis bilaterally.  Aspirin was increased from 81 mg 325 mg daily. He had an echo 09/23/16 that revealed LDH 45-50% with severe septal hypertrophy and grade 1 diastolic dysfunction. He also had moderate tricuspid regurgitation. He subsequently had a Lexiscan Myoview 10/16/16 that was negative for ischemia.  He wore 30 day event monitor that did not reveal any arrhythmias.      [name] has been doing well. He denies any chest pain or shortness of breath. Since his last appointment he had one episode 2 or 3 weeks ago where he felt like he was about to lose vision in his eye but it did not occur. He has not noted any chest pain or shortness of breath. He also denies lower extremity edema, orthopnea, or PND.  He walks for exercise, but not regularly.  He denies exertional symptoms, lower extremity edema, orthopnea, or PND.  Past Medical History:  Diagnosis Date  . Amaurosis fugax of right eye 09/03/2016  . Carotid stenosis 09/03/2016   Mild 05/2016  . Chronic combined systolic and diastolic heart failure (HCC) 10/23/2016  . Essential hypertension 09/03/2016  . Hyperlipidemia   . Hypertension     Past Surgical History:    Procedure Laterality Date  . EYE SURGERY    . polyp removal       Current Outpatient Prescriptions  Medication Sig Dispense Refill  . amLODipine (NORVASC) 5 MG tablet Take 1.5 tablets (7.5 mg total) by mouth daily. 135 tablet 3  . aspirin EC 81 MG tablet Take 81 mg by mouth 3 (three) times daily.     . pravastatin (PRAVACHOL) 40 MG tablet Take 40 mg by mouth daily.     No current facility-administered medications for this visit.     Allergies:   Patient has no known allergies.    Social History:  The patient  reports that he has never smoked. He has never used smokeless tobacco. He reports that he drinks about 2.4 oz of alcohol per week . He reports that he does not use drugs.   Family History:  The patient's family history is not on file.    ROS:  Please see the history of present illness.   Otherwise, review of systems are positive for none.   All other systems are reviewed and negative.    PHYSICAL EXAM: VS:  BP 136/73   Pulse 74   Ht 6' (1.829 m)   Wt 78.4 kg (172 lb 12.8 oz)   BMI 23.44 kg/m  , BMI Body mass index is 23.44 kg/m. GENERAL:  Well appearing.  No acute distress HEENT:  Pupils equal round  and reactive, fundi not visualized, oral mucosa unremarkable NECK:  No jugular venous distention, waveform within normal limits, carotid upstroke brisk and symmetric, no bruits LUNGS:  Clear to auscultation bilaterally.  No crackles, wheezes, or rhonchi HEART:  RRR.  PMI not displaced or sustained,S1 and S2 within normal limits, no S3, no S4, no clicks, no rubs, no murmurs ABD:  Flat, positive bowel sounds normal in frequency in pitch, no bruits, no rebound, no guarding, no midline pulsatile mass, no hepatomegaly, no splenomegaly EXT:  2 plus pulses throughout, no edema, no cyanosis no clubbing SKIN:  No rashes no nodules NEURO:  Cranial nerves II through XII grossly intact, motor grossly intact throughout PSYCH:  Cognitively intact, oriented to person place and  time   Echo 09/23/16: Study Conclusions  - Left ventricle: The cavity size was normal. Wall thickness was   increased in a pattern of moderate LVH. There was severe focal   basal hypertrophy of the septum. Systolic function was mildly   reduced. The estimated ejection fraction was in the range of 45%   to 50%. Diffuse hypokinesis. Doppler parameters are consistent   with abnormal left ventricular relaxation (grade 1 diastolic   dysfunction). The E/e&' ratio is between 8-15, suggesting   indeterminate LV filling pressure. - Aortic valve: Trileaflet. Sclerosis without stenosis. There was   trivial regurgitation. - Mitral valve: Mildly thickened leaflets . There was trivial   regurgitation. - Tricuspid valve: There was moderate regurgitation. - Pulmonary arteries: PA peak pressure: 28 mm Hg (S). - Inferior vena cava: The vessel was normal in size. The   respirophasic diameter changes were in the normal range (= 50%),   consistent with normal central venous pressure.  Impressions:  - LVEF 45-50%, moderate LVH with severe focal basal septal   hypertrophy, global hypokinesis, grade 1 DD with indeterminate LV   filling pressure, aortic valve sclerosis, trivial MR, moderate   TR, RVSP 28 mmHg, normal IVC.  Lexiscan Myoview 10/16/16: Nuclear stress EF: 49%.  Probable normal perfusion and soft tissue attenuation. (diaphragm, bowel activity) No ischemia.  This is a low risk study.    EKG:  EKG is ordered today. The ekg ordered today demonstrates sinus rhythm.  Rate 65 bpm.   Recent Labs: No results found for requested labs within last 8760 hours.    Lipid Panel No results found for: CHOL, TRIG, HDL, CHOLHDL, VLDL, LDLCALC, LDLDIRECT    Wt Readings from Last 3 Encounters:  10/23/16 78.4 kg (172 lb 12.8 oz)  10/16/16 79.8 kg (176 lb)  09/03/16 79.8 kg (176 lb)      ASSESSMENT AND PLAN:  # Transient monocular blindness:  # Hyperlipidemia: [name] has experienced  several episodes of transient blindness in the same eye.  He was noted to have mild carotid stenosis bilaterally. The monitor did not reveal any episodes of atrial fibrillation.  Continue aspirin and statin.  Goal LDL <70.  # Hypertension: Blood pressure is better controlled.  He has struggled with some orthostasis at home. His blood pressure was low when on 10 mg of amlodipine. Continue 7.5 mg daily.  # Chronic systolic and diastolic heart failure: Mr. Baker PieriniMorgan's echo revealed mildly reduced systolic function. He has no evidence of heart failure. We discussed the importance of limiting salt and fluid in his diet. Given that his blood pressure has been labile we will not make any changes at this time.   # Hyperlipidemia: Continue pravastatin.  He is due to have lipids checked with  his PCP this summer.  Current medicines are reviewed at length with the patient today.  The patient does not have concerns regarding medicines.  The following changes have been made:  no change  Labs/ tests ordered today include:   No orders of the defined types were placed in this encounter.    Disposition:   FU with Dajanique Robley C. Duke Salvia, MD, [facility] as needed   This note was written with the assistance of speech recognition software.  Please excuse any transcriptional errors.  Signed, Hollee Fate C. Duke Salvia, MD, [facility]  10/23/2016 1:21 PM    [facility]

## 2017-03-02 ENCOUNTER — Other Ambulatory Visit: Payer: Self-pay | Admitting: Family Medicine

## 2017-04-29 IMAGING — MR MR MRA HEAD W/O CM
11 series · 42 of 48 positions shown · non-contrast
Comparison: None.

CLINICAL DATA: Amaurosis fugax right eye

EXAM:
MRI HEAD WITHOUT CONTRAST
MRA HEAD WITHOUT CONTRAST
TECHNIQUE: Multiplanar, multiecho pulse sequences of the brain and surrounding
structures were obtained without intravenous contrast. Angiographic
images of the head were obtained using MRA technique without
contrast.

[Series 3: tof_3d_multi-slab new · axial · 0.7mm · 0.37mm/px · z∈[-47,+76]mm · 11 of 169 slices shown]
[im 1/169]
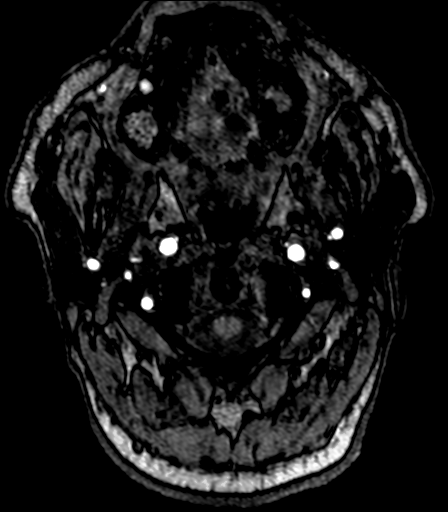
[im 17/169]
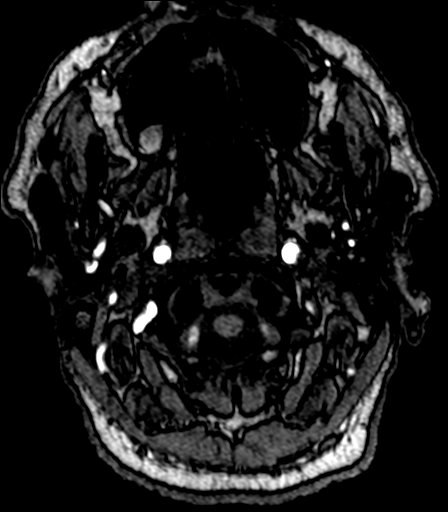
[im 34/169]
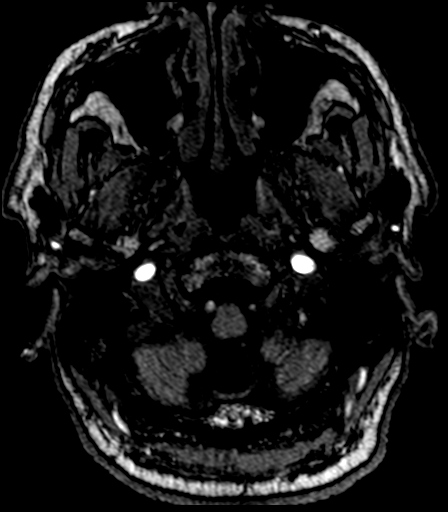
[im 51/169]
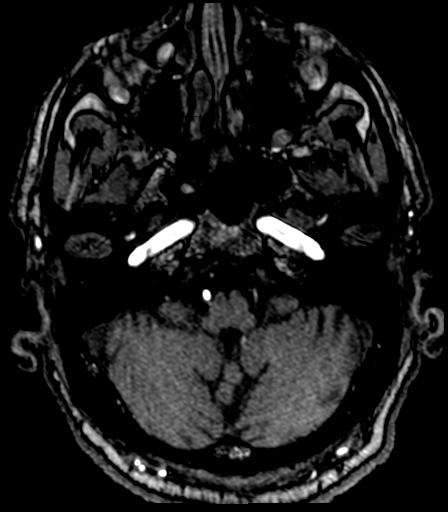
[im 68/169]
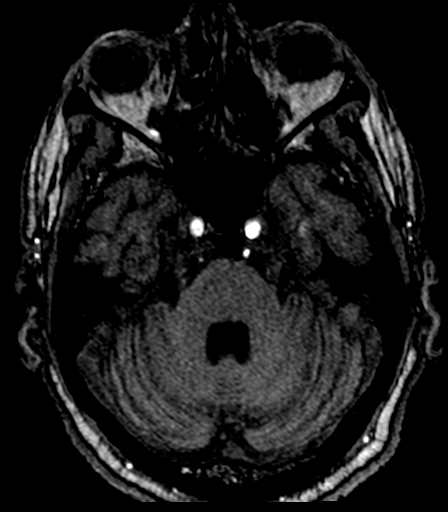
[im 85/169]
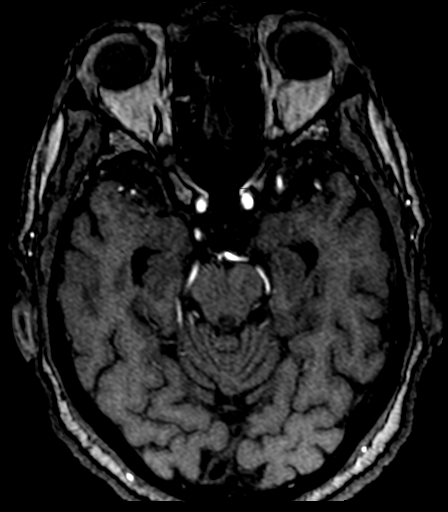
[im 101/169]
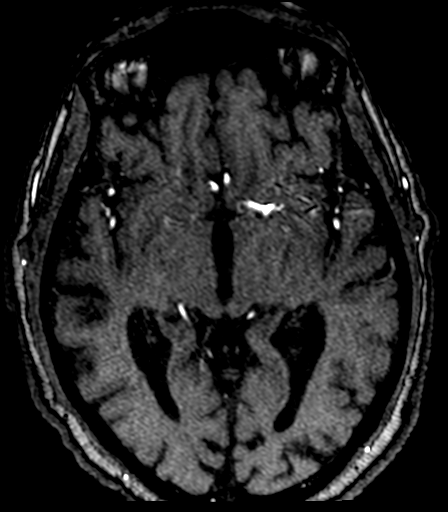
[im 118/169]
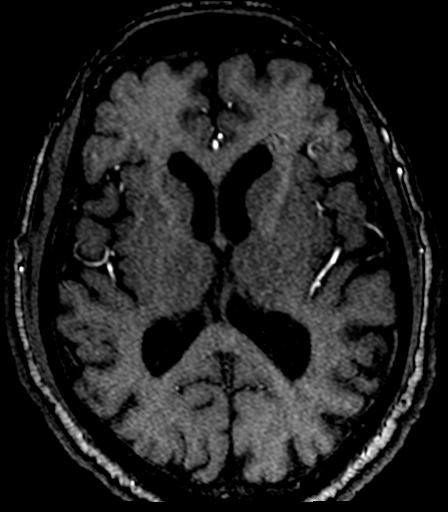
[im 135/169]
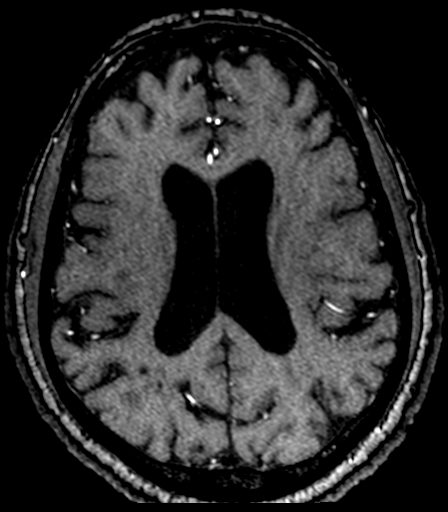
[im 152/169]
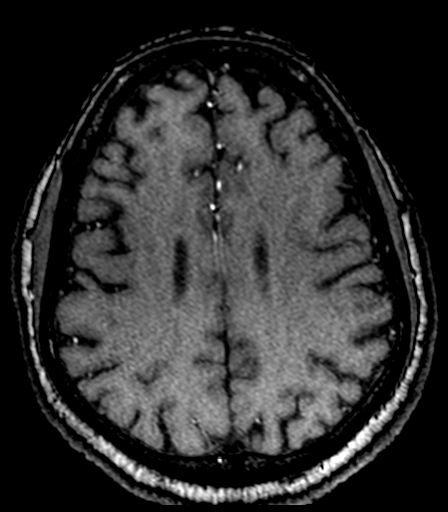
[im 169/169]
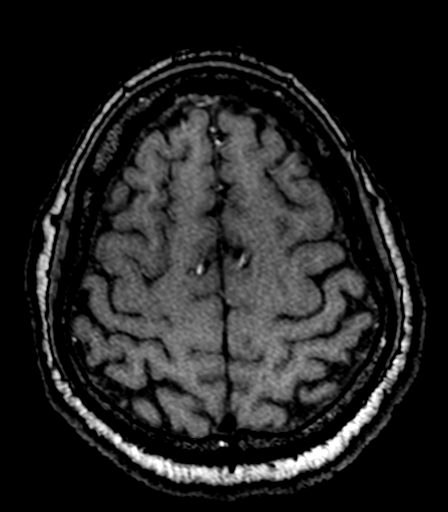

[Series 7: t1_se_sag · sagittal · 5.0mm · 0.45mm/px · 1 of 21 slices shown]
[im 1/21]
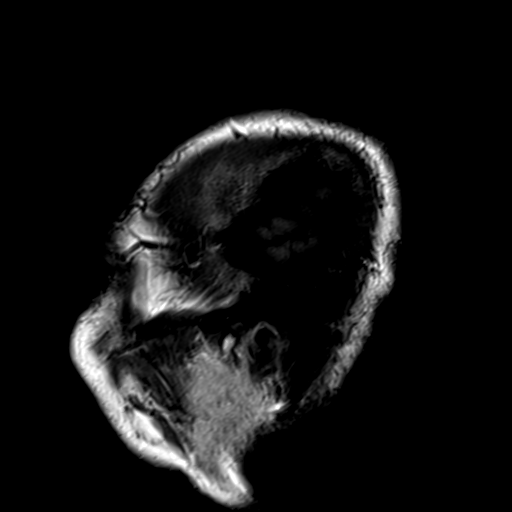

[Series 8: ep2d_diff_(id)_trace · axial · 3.0mm · 1.80mm/px · z∈[-37,+104]mm · 6 of 96 slices shown]
[im 1/96]
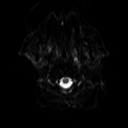
[im 20/96]
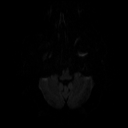
[im 39/96]
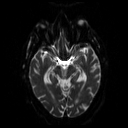
[im 58/96]
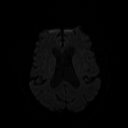
[im 77/96]
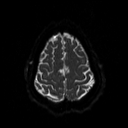
[im 96/96]
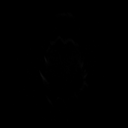

[Series 9: ep2d_diff_(id)_trace_adc · axial · 3.0mm · 1.80mm/px · z∈[-37,+104]mm · 3 of 48 slices shown]
[im 1/48]
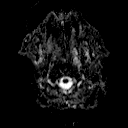
[im 24/48]
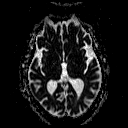
[im 48/48]
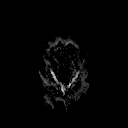

[Series 12: ep2d_diff_cor · coronal · 5.0mm · 1.77mm/px · 4 of 54 slices shown]
[im 1/54]
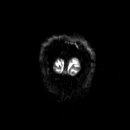
[im 18/54]
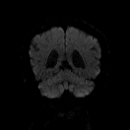
[im 36/54]
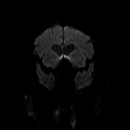
[im 54/54]
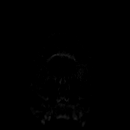

[Series 13: ep2d_diff_cor_adc · coronal · 5.0mm · 1.77mm/px · 2 of 27 slices shown]
[im 1/27]
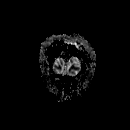
[im 27/27]
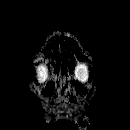

[Series 18: swi_images · axial · 2.0mm · 0.90mm/px · z∈[-45,+112]mm · 5 of 80 slices shown]
[im 1/80]
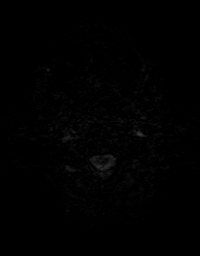
[im 20/80]
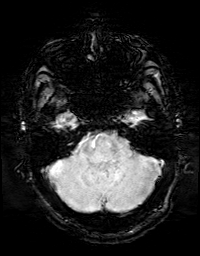
[im 40/80]
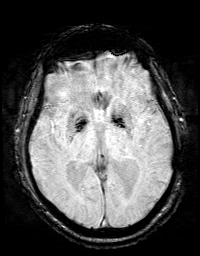
[im 60/80]
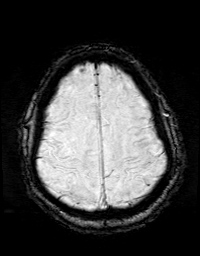
[im 80/80]
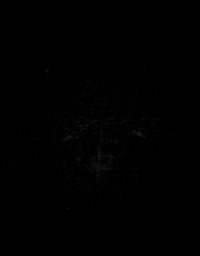

[Series 20: FLAIR · axial · 3.0mm · 0.43mm/px · z∈[-45,+111]mm · 2 of 27 slices shown]
[im 1/27]
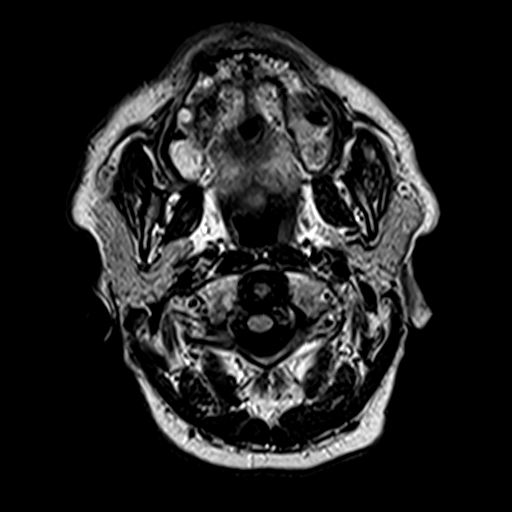
[im 27/27]
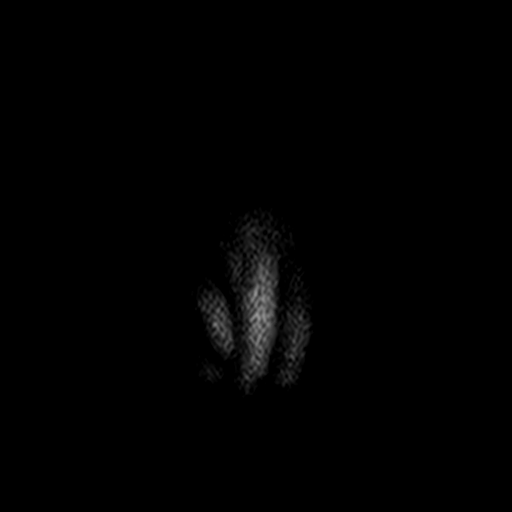

[Series 22: t2_tse_tra_512 · axial · 5.0mm · 0.60mm/px · z∈[-35,+102]mm · 2 of 24 slices shown]
[im 1/24]
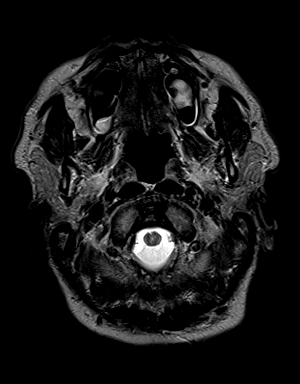
[im 24/24]
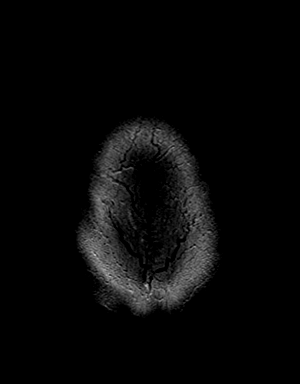

[Series 23: t1_mpr_tra · axial · 1.0mm · 0.72mm/px · z∈[-46,+24]mm · 4 of 160 slices shown]
[im 1/160]
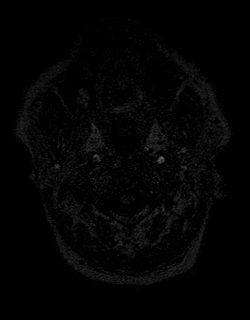
[im 18/160]
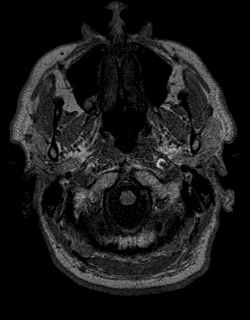
[im 54/160]
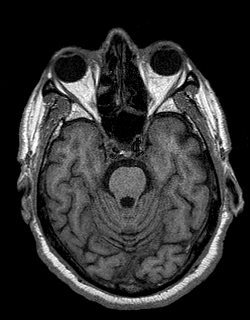
[im 71/160]
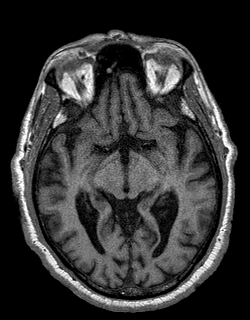

[Series 24: T2 · coronal · 5.0mm · 0.45mm/px · 2 of 28 slices shown]
[im 1/28]
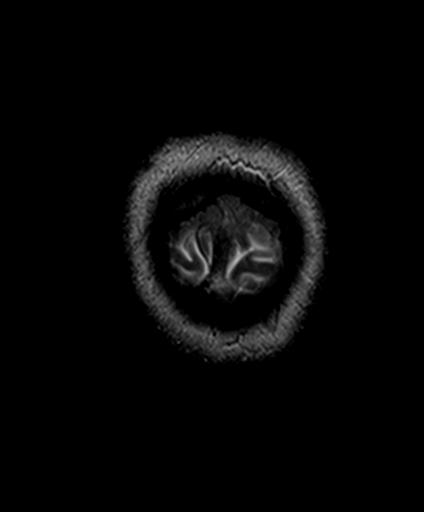
[im 28/28]
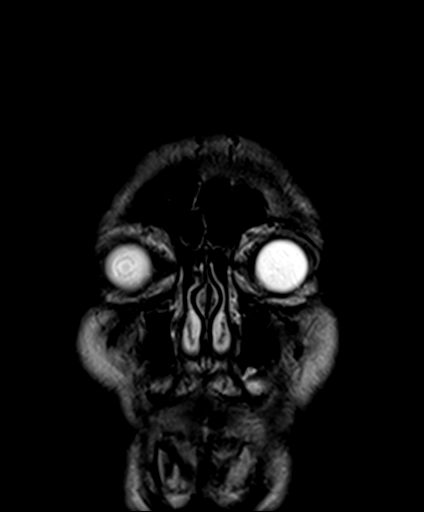

[42 of 48 positions shown; findings below may reference images not displayed]

FINDINGS: MRI HEAD FINDINGS

Brain: Mild atrophy. Negative for hydrocephalus. Negative for acute
infarct. Scattered small white matter hyperintensities in the
frontal lobes bilaterally. Negative for hemorrhage or fluid
collection. Negative for mass or edema.

Vascular: Normal arterial flow void

Skull and upper cervical spine: Negative

Sinuses/Orbits: Mild mucosal edema in the paranasal sinuses.
Bilateral cataract removal.

Other: None

MRA HEAD FINDINGS

There is artifact through the distal right vertebral artery without
definite stenosis. Left vertebral artery appears to end in PICA.
Basilar widely patent. Fetal origin of the posterior communicating
artery bilaterally with small basilar. Superior cerebellar arteries
are patent bilaterally.

Internal carotid artery widely patent bilaterally without stenosis.
Anterior and middle cerebral arteries widely patent without
significant stenosis.

Negative for cerebral aneurysm.
IMPRESSION: Negative for acute infarct. Small white matter hyperintensities in
the frontal lobes bilaterally are nonspecific but may be due to
chronic microvascular ischemia or possibly migraine headaches

Negative MRA head

## 2017-07-15 ENCOUNTER — Ambulatory Visit (INDEPENDENT_AMBULATORY_CARE_PROVIDER_SITE_OTHER): Payer: Medicare Other

## 2017-07-15 ENCOUNTER — Other Ambulatory Visit: Payer: Self-pay

## 2017-07-15 ENCOUNTER — Ambulatory Visit: Payer: Medicare Other | Admitting: Emergency Medicine

## 2017-07-15 ENCOUNTER — Encounter: Payer: Self-pay | Admitting: Emergency Medicine

## 2017-07-15 VITALS — BP 142/76 | HR 98 | Temp 98.2°F | Resp 16 | Ht 71.0 in | Wt 173.2 lb

## 2017-07-15 DIAGNOSIS — M79672 Pain in left foot: Secondary | ICD-10-CM

## 2017-07-15 MED ORDER — DICLOFENAC SODIUM 75 MG PO TBEC: 75.0000 mg | DELAYED_RELEASE_TABLET | Freq: Two times a day (BID) | ORAL | 0 refills | Status: DC

## 2017-07-15 MED ORDER — DICLOFENAC SODIUM 1 % TD GEL: 2.0000 g | Freq: Three times a day (TID) | TRANSDERMAL | 2 refills | Status: AC

## 2017-07-15 NOTE — Progress Notes (Signed)
Stephen Chavez 84 y.o.   Chief Complaint  Patient presents with  . Foot Swelling    LEFT since Sunday    HISTORY OF PRESENT ILLNESS: This is a 84 y.o. male complaining of pain to the left foot for 3 days.  Denies injury or any other significant symptoms.  HPI   Prior to Admission medications   Medication Sig Start Date End Date Taking? Authorizing Provider  amLODipine (NORVASC) 5 MG tablet Take 1.5 tablets (7.5 mg total) by mouth daily. 09/03/16  Yes Chilton Siandolph, Tiffany, MD  aspirin EC 81 MG tablet Take 81 mg by mouth 3 (three) times daily.    Yes [provider]  pravastatin (PRAVACHOL) 40 MG tablet Take 40 mg by mouth daily.   Yes [provider]    No Known Allergies  Patient Active Problem List   Diagnosis Date Noted  . Chronic combined systolic and diastolic heart failure (HCC) 10/23/2016  . Carotid stenosis 09/03/2016  . Essential hypertension 09/03/2016  . Amaurosis fugax of right eye 06/05/2016    Past Medical History:  Diagnosis Date  . Amaurosis fugax of right eye 09/03/2016  . Carotid stenosis 09/03/2016   Mild 05/2016  . Chronic combined systolic and diastolic heart failure (HCC) 10/23/2016  . Essential hypertension 09/03/2016  . Hyperlipidemia   . Hypertension     Past Surgical History:  Procedure Laterality Date  . EYE SURGERY    . polyp removal      Social History   Socioeconomic History  . Marital status: Married    Spouse name: Not on file  . Number of children: Not on file  . Years of education: Not on file  . Highest education level: Not on file  Social Needs  . Financial resource strain: Not on file  . Food insecurity - worry: Not on file  . Food insecurity - inability: Not on file  . Transportation needs - medical: Not on file  . Transportation needs - non-medical: Not on file  Occupational History  . Not on file  Tobacco Use  . Smoking status: Never Smoker  . Smokeless tobacco: Never Used  Substance and Sexual  Activity  . Alcohol use: Yes    Alcohol/week: 2.4 oz    Types: 4 Standard drinks or equivalent per week  . Drug use: No  . Sexual activity: Not on file  Other Topics Concern  . Not on file  Social History Narrative  . Not on file    No family history on file.   ROS   Physical Exam  Constitutional: He is oriented to person, place, and time. He appears well-developed and well-nourished.  HENT:  Head: Normocephalic and atraumatic.  Eyes: EOM are normal. Pupils are equal, round, and reactive to light.  Neck: Normal range of motion.  Cardiovascular: Normal rate.  Pulmonary/Chest: Effort normal.  Musculoskeletal:  Left foot: Positive erythema and swelling to proximal fifth metatarsal area. NVI with FROM.  Neurological: He is alert and oriented to person, place, and time. He exhibits normal muscle tone.  Skin: Skin is warm. Capillary refill takes less than 2 seconds.  Psychiatric: He has a normal mood and affect. His behavior is normal.  Vitals reviewed.  Dg Foot Complete Left  Result Date: 07/15/2017 CLINICAL DATA:  Left foot pain and swelling without trauma. EXAM: LEFT FOOT - COMPLETE 3+ VIEW COMPARISON:  None. FINDINGS: mild to moderate degenerate changes of the first metatarsal phalangeal joint. No acute fracture or dislocation. No periosteal reaction  or callus deposition. Small Achilles and calcaneal spurs. Mild midfoot osteoarthritis. IMPRESSION: Degenerative change, without acute osseous finding. Electronically Signed   By: Jeronimo Greaves M.D.   On: 07/15/2017 16:35     ASSESSMENT & PLAN: Stephen Chavez was seen today for foot swelling.  Diagnoses and all orders for this visit:  Left foot pain -     DG Foot Complete Left; Future -     Discontinue: diclofenac (VOLTAREN) 75 MG EC tablet; Take 1 tablet (75 mg total) by mouth 2 (two) times daily for 5 days. -     diclofenac sodium (VOLTAREN) 1 % GEL; Apply 2 g topically 3 (three) times daily for 7 days.    Patient Instructions        IF you received an x-ray today, you will receive an invoice from [facility]. Please contact [facility] at [phone] with questions or concerns regarding your invoice.   IF you received labwork today, you will receive an invoice from [facility]. Please contact [facility] at [phone] with questions or concerns regarding your invoice.   Our billing staff will not be able to assist you with questions regarding bills from these companies.  You will be contacted with the lab results as soon as they are available. The fastest way to get your results is to activate your My Chart account. Instructions are located on the last page of this paperwork. If you have not heard from [place] regarding the results in 2 weeks, please contact this office.     Foot Pain Many things can cause foot pain. Some common causes are:  An injury.  A sprain.  Arthritis.  Blisters.  Bunions.  Follow these instructions at home: Pay attention to any changes in your symptoms. Take these actions to help with your discomfort:  If directed, put ice on the affected area: ? Put ice in a plastic bag. ? Place a towel between your skin and the bag. ? Leave the ice on for 15-20 minutes, 3?4 times a day for 2 days.  Take over-the-counter and prescription medicines only as told by your health care provider.  Wear comfortable, supportive shoes that fit you well. Do not wear high heels.  Do not stand or walk for long periods of time.  Do not lift a lot of weight. This can put added pressure on your feet.  Do stretches to relieve foot pain and stiffness as told by your health care provider.  Rub your foot gently.  Keep your feet clean and dry.  Contact a health care provider if:  Your pain does not get better after a few days of self-care.  Your pain gets worse.  You cannot stand on your foot. Get help right away if:  Your foot is numb or tingling.  Your foot or toes are  swollen.  Your foot or toes turn white or blue.  You have warmth and redness along your foot. This information is not intended to replace advice given to you by your health care provider. Make sure you discuss any questions you have with your health care provider. Document Released: 06/29/2015 Document Revised: 11/08/2015 Document Reviewed: 06/28/2014 Elsevier Interactive Patient Education  2018 [employer].      Edwina Barth, MD [facility]

## 2017-07-15 NOTE — Patient Instructions (Addendum)
     IF you received an x-ray today, you will receive an invoice from [facility]. Please contact [facility] at [phone] with questions or concerns regarding your invoice.   IF you received labwork today, you will receive an invoice from [facility]. Please contact [facility] at [phone] with questions or concerns regarding your invoice.   Our billing staff will not be able to assist you with questions regarding bills from these companies.  You will be contacted with the lab results as soon as they are available. The fastest way to get your results is to activate your My Chart account. Instructions are located on the last page of this paperwork. If you have not heard from us regarding the results in 2 weeks, please contact this office.     Foot Pain Many things can cause foot pain. Some common causes are:  An injury.  A sprain.  Arthritis.  Blisters.  Bunions.  Follow these instructions at home: Pay attention to any changes in your symptoms. Take these actions to help with your discomfort:  If directed, put ice on the affected area: ? Put ice in a plastic bag. ? Place a towel between your skin and the bag. ? Leave the ice on for 15-20 minutes, 3?4 times a day for 2 days.  Take over-the-counter and prescription medicines only as told by your health care provider.  Wear comfortable, supportive shoes that fit you well. Do not wear high heels.  Do not stand or walk for long periods of time.  Do not lift a lot of weight. This can put added pressure on your feet.  Do stretches to relieve foot pain and stiffness as told by your health care provider.  Rub your foot gently.  Keep your feet clean and dry.  Contact a health care provider if:  Your pain does not get better after a few days of self-care.  Your pain gets worse.  You cannot stand on your foot. Get help right away if:  Your foot is numb or tingling.  Your foot or toes are  swollen.  Your foot or toes turn white or blue.  You have warmth and redness along your foot. This information is not intended to replace advice given to you by your health care provider. Make sure you discuss any questions you have with your health care provider. Document Released: 06/29/2015 Document Revised: 11/08/2015 Document Reviewed: 06/28/2014 Elsevier Interactive Patient Education  2018 [employer] Inc[employer]  

## 2017-07-16 ENCOUNTER — Telehealth: Payer: Self-pay

## 2017-07-16 NOTE — Telephone Encounter (Signed)
PA was approved for generic Diclofenac 1% gel.  Pharmacy aware.

## 2017-11-11 ENCOUNTER — Other Ambulatory Visit: Payer: Self-pay | Admitting: Cardiovascular Disease

## 2018-01-29 ENCOUNTER — Other Ambulatory Visit: Payer: Self-pay | Admitting: Cardiovascular Disease

## 2018-04-28 ENCOUNTER — Other Ambulatory Visit: Payer: Self-pay | Admitting: Family Medicine

## 2018-04-28 DIAGNOSIS — R9389 Abnormal findings on diagnostic imaging of other specified body structures: Secondary | ICD-10-CM

## 2018-04-30 ENCOUNTER — Ambulatory Visit
Admission: RE | Admit: 2018-04-30 | Discharge: 2018-04-30 | Disposition: A | Discharge status: Home / Self Care | Payer: Medicare Other | Source: Ambulatory Visit | Attending: Family Medicine | Admitting: Family Medicine

## 2018-04-30 DIAGNOSIS — R9389 Abnormal findings on diagnostic imaging of other specified body structures: Secondary | ICD-10-CM

## 2018-05-07 ENCOUNTER — Other Ambulatory Visit: Payer: Self-pay | Admitting: Cardiovascular Disease

## 2018-05-20 ENCOUNTER — Other Ambulatory Visit: Payer: Self-pay | Admitting: Family Medicine

## 2018-05-20 DIAGNOSIS — R9389 Abnormal findings on diagnostic imaging of other specified body structures: Secondary | ICD-10-CM

## 2018-05-25 ENCOUNTER — Other Ambulatory Visit: Payer: Self-pay | Admitting: Cardiovascular Disease

## 2018-05-28 ENCOUNTER — Ambulatory Visit
Admission: RE | Admit: 2018-05-28 | Discharge: 2018-05-28 | Disposition: A | Discharge status: Home / Self Care | Payer: Medicare Other | Source: Ambulatory Visit | Attending: Family Medicine | Admitting: Family Medicine

## 2018-05-28 DIAGNOSIS — R9389 Abnormal findings on diagnostic imaging of other specified body structures: Secondary | ICD-10-CM

## 2018-05-28 MED ORDER — GADOBENATE DIMEGLUMINE 529 MG/ML IV SOLN
15.0000 mL | Freq: Once | INTRAVENOUS | Status: AC | PRN
  Administered 2018-05-28: 15 mL via INTRAVENOUS

## 2018-06-27 ENCOUNTER — Encounter (HOSPITAL_COMMUNITY): Payer: Self-pay

## 2018-06-27 ENCOUNTER — Emergency Department (HOSPITAL_COMMUNITY): Payer: Medicare Other

## 2018-06-27 ENCOUNTER — Observation Stay (HOSPITAL_COMMUNITY)
Admission: EM | Admit: 2018-06-27 | Discharge: 2018-06-29 | Disposition: A | Discharge status: Home / Self Care | Payer: Medicare Other | Attending: Emergency Medicine | Admitting: Emergency Medicine

## 2018-06-27 ENCOUNTER — Other Ambulatory Visit: Payer: Self-pay

## 2018-06-27 DIAGNOSIS — E785 Hyperlipidemia, unspecified: Secondary | ICD-10-CM | POA: Insufficient documentation

## 2018-06-27 DIAGNOSIS — D72829 Elevated white blood cell count, unspecified: Secondary | ICD-10-CM | POA: Diagnosis present

## 2018-06-27 DIAGNOSIS — R531 Weakness: Secondary | ICD-10-CM

## 2018-06-27 DIAGNOSIS — N183 Chronic kidney disease, stage 3 (moderate): Secondary | ICD-10-CM | POA: Insufficient documentation

## 2018-06-27 DIAGNOSIS — Z7982 Long term (current) use of aspirin: Secondary | ICD-10-CM | POA: Insufficient documentation

## 2018-06-27 DIAGNOSIS — M6281 Muscle weakness (generalized): Secondary | ICD-10-CM | POA: Diagnosis present

## 2018-06-27 DIAGNOSIS — I2699 Other pulmonary embolism without acute cor pulmonale: Secondary | ICD-10-CM | POA: Diagnosis not present

## 2018-06-27 DIAGNOSIS — Z79899 Other long term (current) drug therapy: Secondary | ICD-10-CM | POA: Insufficient documentation

## 2018-06-27 DIAGNOSIS — E782 Mixed hyperlipidemia: Secondary | ICD-10-CM | POA: Diagnosis present

## 2018-06-27 DIAGNOSIS — I5042 Chronic combined systolic (congestive) and diastolic (congestive) heart failure: Secondary | ICD-10-CM | POA: Diagnosis not present

## 2018-06-27 DIAGNOSIS — I10 Essential (primary) hypertension: Secondary | ICD-10-CM | POA: Diagnosis present

## 2018-06-27 DIAGNOSIS — I5032 Chronic diastolic (congestive) heart failure: Secondary | ICD-10-CM | POA: Diagnosis present

## 2018-06-27 DIAGNOSIS — I130 Hypertensive heart and chronic kidney disease with heart failure and stage 1 through stage 4 chronic kidney disease, or unspecified chronic kidney disease: Secondary | ICD-10-CM | POA: Insufficient documentation

## 2018-06-27 LAB — CBC WITH DIFFERENTIAL/PLATELET
ABS IMMATURE GRANULOCYTES: 0.09 10*3/uL — AB (ref 0.00–0.07)
BASOS ABS: 0 10*3/uL (ref 0.0–0.1)
BASOS PCT: 0 %
Eosinophils Absolute: 0.1 10*3/uL (ref 0.0–0.5)
Eosinophils Relative: 1 %
HCT: 30.5 % — ABNORMAL LOW (ref 39.0–52.0)
Hemoglobin: 10 g/dL — ABNORMAL LOW (ref 13.0–17.0)
IMMATURE GRANULOCYTES: 1 %
Lymphocytes Relative: 12 %
Lymphs Abs: 1.5 10*3/uL (ref 0.7–4.0)
MCH: 26.7 pg (ref 26.0–34.0)
MCHC: 32.8 g/dL (ref 30.0–36.0)
MCV: 81.6 fL (ref 80.0–100.0)
MONOS PCT: 8 %
Monocytes Absolute: 1 10*3/uL (ref 0.1–1.0)
NEUTROS ABS: 10.5 10*3/uL — AB (ref 1.7–7.7)
NEUTROS PCT: 78 %
PLATELETS: 247 10*3/uL (ref 150–400)
RBC: 3.74 MIL/uL — AB (ref 4.22–5.81)
RDW: 15.7 % — ABNORMAL HIGH (ref 11.5–15.5)
WBC: 13.2 10*3/uL — AB (ref 4.0–10.5)
nRBC: 0 % (ref 0.0–0.2)

## 2018-06-27 LAB — I-STAT TROPONIN, ED: TROPONIN I, POC: 0 ng/mL (ref 0.00–0.08)

## 2018-06-27 MED ORDER — SODIUM CHLORIDE 0.9 % IV BOLUS (SEPSIS)
500.0000 mL | Freq: Once | INTRAVENOUS | Status: AC
  Administered 2018-06-27: 500 mL via INTRAVENOUS

## 2018-06-27 NOTE — ED Provider Notes (Signed)
TIME SEEN: 11:18 PM  CHIEF COMPLAINT: Generalized weakness  HPI: Patient is an 85 year old male with history of hypertension, hyperlipidemia, CHF who presents to the emergency department with complaints of generalized weakness, decreased energy and fatigue over the past 3 to 4 days.  States he has had some increased shortness of breath mostly with exertion, frequent urination every 1-2 hours, intermittent nausea without vomiting.  States he also had some left lateral chest pain that is worse with deep inspiration.  No chest pressure, heaviness or tightness.  No injury to the left chest wall.  States he did feel like he had some "indigestion the other night".  No numbness, tingling or focal weakness.  Wife reports he was recently on prednisone for a gout flare.  Finished this several days ago.  He denies fevers, cough, diarrhea, dysuria.  ROS: See HPI Constitutional: no fever  Eyes: no drainage  ENT: no runny nose   Cardiovascular:   chest pain  Resp:  SOB  GI: no vomiting GU: no dysuria Integumentary: no rash  Allergy: no hives  Musculoskeletal: no leg swelling  Neurological: no slurred speech ROS otherwise negative  PAST MEDICAL HISTORY/PAST SURGICAL HISTORY:  Past Medical History:  Diagnosis Date  . Amaurosis fugax of right eye 09/03/2016  . Carotid stenosis 09/03/2016   Mild 05/2016  . Chronic combined systolic and diastolic heart failure (HCC) 10/23/2016  . Essential hypertension 09/03/2016  . Hyperlipidemia   . Hypertension     MEDICATIONS:  Prior to Admission medications   Medication Sig Start Date End Date Taking? Authorizing Provider  amLODipine (NORVASC) 5 MG tablet TAKE 4 TABLETS BY MOUTH EVERY DAY. NEEDS OFFICE VISIT 05/25/18   Chilton Si, MD  aspirin EC 81 MG tablet Take 81 mg by mouth 3 (three) times daily.     [provider]  pravastatin (PRAVACHOL) 40 MG tablet Take 40 mg by mouth daily.    [provider]    ALLERGIES:  No Known  Allergies  SOCIAL HISTORY:  Social History   Tobacco Use  . Smoking status: Never Smoker  . Smokeless tobacco: Never Used  Substance Use Topics  . Alcohol use: Yes    Alcohol/week: 4.0 standard drinks    Types: 4 Standard drinks or equivalent per week    FAMILY HISTORY: History reviewed. No pertinent family history.  EXAM: BP 125/79   Pulse 92   Temp 98.8 F (37.1 C) (Oral)   Resp 14   SpO2 94%  CONSTITUTIONAL: Alert and oriented and responds appropriately to questions. Well-appearing; well-nourished, elderly HEAD: Normocephalic EYES: Conjunctivae clear, pupils appear equal, EOMI ENT: normal nose; moist mucous membranes NECK: Supple, no meningismus, no nuchal rigidity, no LAD  CARD: RRR; S1 and S2 appreciated; no murmurs, no clicks, no rubs, no gallops CHEST:  Chest wall is non-tender to palpation.  No crepitus, ecchymosis, erythema, warmth, rash or other lesions present.   RESP: Normal chest excursion without splinting or tachypnea; breath sounds clear and equal bilaterally; no wheezes, no rhonchi, no rales, no hypoxia or respiratory distress, speaking full sentences ABD/GI: Normal bowel sounds; non-distended; soft, non-tender, no rebound, no guarding, no peritoneal signs, no hepatosplenomegaly BACK:  The back appears normal and is non-tender to palpation, there is no CVA tenderness EXT: Normal ROM in all joints; non-tender to palpation; no edema; normal capillary refill; no cyanosis, no calf tenderness or swelling    SKIN: Normal color for age and race; warm; no rash NEURO: Moves all extremities equally, strength 5/5 in  all 4 extremities, cranial nerves II through XII intact, normal speech, sensation to light touch intact diffusely  pSYCH: The patient's mood and manner are appropriate. Grooming and personal hygiene are appropriate.  MEDICAL DECISION MAKING: Patient here with multiple complaints.  States the main reason he is here is generalized weakness, decreased energy and  fatigue over the past few days.  He is concerned that he could be dehydrated.  No vomiting or diarrhea.  Also complaining of shortness of breath with exertion and left-sided chest pain that is worse with deep inspiration.  No chest pain currently.  No focal neurologic deficits on exam.  Doubt stroke.  Will give gentle IV hydration.  Will obtain labs urine, chest x-ray to evaluate for anemia, electrolyte abnormality, infection ACS.  EKG shows no ischemic changes.  ED PROGRESS: Patient's labs show leukocytosis of 13,000 which may be from recent prednisone use.  Creatinine mildly elevated at 1.87 with no old for comparison but normal BUN so I suspect that this is chronic.  Troponin is negative.  BNP normal.  Chest x-ray clear and shows no left rib fractures.  Urine shows no sign of infection or dehydration.  D-dimer is elevated.  Will proceed with CT of his chest.  He has received some gentle IV hydration prior to receiving IV contrast.  He does have an EF of 45 to 50%.  Will avoid over hydrating patient to prevent CHF exacerbation.  2:06 AM  D/w radiology.  Patient has multiple pulmonary emboli in all lobes of his lungs that are subsegmental and segmental with moderate clot burden.  Will start heparin and discuss with medicine for admission.  His PCP is Dr[facility] Cl[facility]e [facility].  Patient does report that he and his wife recently went on a cross-country trip in an RV between October and December.  This may be the cause of his blood clots.   2:33 AM Discussed patient's case with hospitalist, Dr. Clyde LundborgNiu.  I have recommended admission and patient (and family if present) agree with this plan. Admitting physician will place admission orders.   I reviewed all nursing notes, vitals, pertinent previous records, EKGs, lab and urine results, imaging (as available).      EKG Interpretation  Date/Time:  Sunday June 27 2018 22:50:06 EST Ventricular Rate:  99 PR Interval:    QRS Duration: 92 QT  Interval:  341 QTC Calculation: 438 R Axis:   -37 Text Interpretation:  Sinus rhythm Left axis deviation No old tracing to compare Confirmed by Jacalyn LefevreHaviland, Julie [phone]([id number]) on 06/27/2018 10:52:32 PM        CRITICAL CARE Performed by: Baxter HireKristen Ward   Total critical care time: 55 minutes  Critical care time was exclusive of separately billable procedures and treating other patients.  Critical care was necessary to treat or prevent imminent or life-threatening deterioration.  Critical care was time spent personally by me on the following activities: development of treatment plan with patient and/or surrogate as well as nursing, discussions with consultants, evaluation of patient's response to treatment, examination of patient, obtaining history from patient or surrogate, ordering and performing treatments and interventions, ordering and review of laboratory studies, ordering and review of radiographic studies, pulse oximetry and re-evaluation of patient's condition.     Ward, Layla MawKristen N, DO 06/28/18 [id number]

## 2018-06-27 NOTE — ED Triage Notes (Signed)
Pt endorses generalized weakness, frequent urination and shob, left ribcage pain x 3 days. Denies fever/chills. Tachy

## 2018-06-27 NOTE — ED Notes (Signed)
Nurse starting IV and will collect labs. 

## 2018-06-28 ENCOUNTER — Emergency Department (HOSPITAL_COMMUNITY): Payer: Medicare Other

## 2018-06-28 ENCOUNTER — Emergency Department (HOSPITAL_BASED_OUTPATIENT_CLINIC_OR_DEPARTMENT_OTHER): Payer: Medicare Other

## 2018-06-28 ENCOUNTER — Observation Stay (HOSPITAL_BASED_OUTPATIENT_CLINIC_OR_DEPARTMENT_OTHER): Payer: Medicare Other

## 2018-06-28 ENCOUNTER — Encounter (HOSPITAL_COMMUNITY): Payer: Self-pay | Admitting: Internal Medicine

## 2018-06-28 DIAGNOSIS — N179 Acute kidney failure, unspecified: Secondary | ICD-10-CM | POA: Diagnosis not present

## 2018-06-28 DIAGNOSIS — I2699 Other pulmonary embolism without acute cor pulmonale: Secondary | ICD-10-CM | POA: Diagnosis not present

## 2018-06-28 DIAGNOSIS — N1832 Chronic kidney disease, stage 3b: Secondary | ICD-10-CM | POA: Insufficient documentation

## 2018-06-28 DIAGNOSIS — I10 Essential (primary) hypertension: Secondary | ICD-10-CM

## 2018-06-28 DIAGNOSIS — E782 Mixed hyperlipidemia: Secondary | ICD-10-CM | POA: Diagnosis present

## 2018-06-28 DIAGNOSIS — I361 Nonrheumatic tricuspid (valve) insufficiency: Secondary | ICD-10-CM | POA: Diagnosis not present

## 2018-06-28 DIAGNOSIS — R531 Weakness: Secondary | ICD-10-CM | POA: Insufficient documentation

## 2018-06-28 DIAGNOSIS — D72829 Elevated white blood cell count, unspecified: Secondary | ICD-10-CM

## 2018-06-28 DIAGNOSIS — I5042 Chronic combined systolic (congestive) and diastolic (congestive) heart failure: Secondary | ICD-10-CM

## 2018-06-28 DIAGNOSIS — E785 Hyperlipidemia, unspecified: Secondary | ICD-10-CM

## 2018-06-28 LAB — URINALYSIS, ROUTINE W REFLEX MICROSCOPIC
Bilirubin Urine: NEGATIVE
GLUCOSE, UA: NEGATIVE mg/dL
Hgb urine dipstick: NEGATIVE
Ketones, ur: NEGATIVE mg/dL
Leukocytes, UA: NEGATIVE
Nitrite: NEGATIVE
PH: 6 (ref 5.0–8.0)
PROTEIN: NEGATIVE mg/dL
SPECIFIC GRAVITY, URINE: 1.012 (ref 1.005–1.030)

## 2018-06-28 LAB — BASIC METABOLIC PANEL
ANION GAP: 9 (ref 5–15)
Anion gap: 8 (ref 5–15)
BUN: 16 mg/dL (ref 8–23)
BUN: 18 mg/dL (ref 8–23)
CO2: 22 mmol/L (ref 22–32)
CO2: 23 mmol/L (ref 22–32)
Calcium: 8.4 mg/dL — ABNORMAL LOW (ref 8.9–10.3)
Calcium: 8.8 mg/dL — ABNORMAL LOW (ref 8.9–10.3)
Chloride: 105 mmol/L (ref 98–111)
Chloride: 105 mmol/L (ref 98–111)
Creatinine, Ser: 1.77 mg/dL — ABNORMAL HIGH (ref 0.61–1.24)
Creatinine, Ser: 1.87 mg/dL — ABNORMAL HIGH (ref 0.61–1.24)
GFR calc Af Amer: 38 mL/min — ABNORMAL LOW (ref >60)
GFR calc Af Amer: 40 mL/min — ABNORMAL LOW (ref >60)
GFR calc non Af Amer: 35 mL/min — ABNORMAL LOW (ref >60)
GFR, EST NON AFRICAN AMERICAN: 33 mL/min — AB (ref >60)
GLUCOSE: 127 mg/dL — AB (ref 70–99)
Glucose, Bld: 152 mg/dL — ABNORMAL HIGH (ref 70–99)
POTASSIUM: 4.4 mmol/L (ref 3.5–5.1)
POTASSIUM: 4.1 mmol/L (ref 3.5–5.1)
Sodium: 136 mmol/L (ref 135–145)
Sodium: 136 mmol/L (ref 135–145)

## 2018-06-28 LAB — HEPARIN LEVEL (UNFRACTIONATED)
Heparin Unfractionated: 0.86 IU/mL — ABNORMAL HIGH (ref 0.30–0.70)
Heparin Unfractionated: 0.78 IU/mL — ABNORMAL HIGH (ref 0.30–0.70)

## 2018-06-28 LAB — BRAIN NATRIURETIC PEPTIDE: B NATRIURETIC PEPTIDE 5: 13.5 pg/mL (ref 0.0–100.0)

## 2018-06-28 LAB — D-DIMER, QUANTITATIVE (NOT AT ARMC): D DIMER QUANT: 3.36 ug{FEU}/mL — AB (ref 0.00–0.50)

## 2018-06-28 LAB — ECHOCARDIOGRAM COMPLETE
Height: 72 in
Weight: 2631.41 oz

## 2018-06-28 MED ORDER — LEVALBUTEROL HCL 1.25 MG/0.5ML IN NEBU: 1.2500 mg | INHALATION_SOLUTION | Freq: Four times a day (QID) | RESPIRATORY_TRACT | Status: DC | PRN

## 2018-06-28 MED ORDER — HEPARIN BOLUS VIA INFUSION
5000.0000 [IU] | Freq: Once | INTRAVENOUS | Status: AC
  Administered 2018-06-28: 5000 [IU] via INTRAVENOUS
  Filled 2018-06-28: qty 5000

## 2018-06-28 MED ORDER — LORATADINE 10 MG PO TABS
10.0000 mg | ORAL_TABLET | Freq: Every day | ORAL | Status: DC
  Administered 2018-06-28: 10 mg via ORAL
  Filled 2018-06-28: qty 1

## 2018-06-28 MED ORDER — OXYCODONE-ACETAMINOPHEN 5-325 MG PO TABS: 1.0000 | ORAL_TABLET | ORAL | Status: DC | PRN

## 2018-06-28 MED ORDER — SODIUM CHLORIDE 0.9 % IV BOLUS (SEPSIS): 500.0000 mL | Freq: Once | INTRAVENOUS | Status: DC

## 2018-06-28 MED ORDER — DM-GUAIFENESIN ER 30-600 MG PO TB12: 1.0000 | ORAL_TABLET | Freq: Two times a day (BID) | ORAL | Status: DC | PRN

## 2018-06-28 MED ORDER — PRAVASTATIN SODIUM 40 MG PO TABS
80.0000 mg | ORAL_TABLET | Freq: Every day | ORAL | Status: DC
  Administered 2018-06-28: 80 mg via ORAL
  Filled 2018-06-28: qty 2

## 2018-06-28 MED ORDER — ACETAMINOPHEN 325 MG PO TABS: 650.0000 mg | ORAL_TABLET | Freq: Four times a day (QID) | ORAL | Status: DC | PRN

## 2018-06-28 MED ORDER — ACETAMINOPHEN 650 MG RE SUPP: 650.0000 mg | Freq: Four times a day (QID) | RECTAL | Status: DC | PRN

## 2018-06-28 MED ORDER — ALLOPURINOL 300 MG PO TABS
300.0000 mg | ORAL_TABLET | Freq: Every day | ORAL | Status: DC
  Administered 2018-06-28 – 2018-06-29 (×2): 300 mg via ORAL
  Filled 2018-06-28 (×2): qty 1

## 2018-06-28 MED ORDER — IOPAMIDOL (ISOVUE-370) INJECTION 76%
100.0000 mL | Freq: Once | INTRAVENOUS | Status: AC | PRN
  Administered 2018-06-28: 55 mL via INTRAVENOUS

## 2018-06-28 MED ORDER — PSYLLIUM 95 % PO PACK
1.0000 | PACK | Freq: Every day | ORAL | Status: DC
  Administered 2018-06-28: 1 via ORAL
  Filled 2018-06-28: qty 1

## 2018-06-28 MED ORDER — ONDANSETRON HCL 4 MG PO TABS: 4.0000 mg | ORAL_TABLET | Freq: Four times a day (QID) | ORAL | Status: DC | PRN

## 2018-06-28 MED ORDER — IOPAMIDOL (ISOVUE-370) INJECTION 76%
INTRAVENOUS | Status: AC
  Filled 2018-06-28: qty 100

## 2018-06-28 MED ORDER — LEVALBUTEROL HCL 1.25 MG/0.5ML IN NEBU: 1.2500 mg | INHALATION_SOLUTION | Freq: Four times a day (QID) | RESPIRATORY_TRACT | Status: DC

## 2018-06-28 MED ORDER — ONDANSETRON HCL 4 MG/2ML IJ SOLN: 4.0000 mg | Freq: Four times a day (QID) | INTRAMUSCULAR | Status: DC | PRN

## 2018-06-28 MED ORDER — SENNOSIDES-DOCUSATE SODIUM 8.6-50 MG PO TABS: 1.0000 | ORAL_TABLET | Freq: Every evening | ORAL | Status: DC | PRN

## 2018-06-28 MED ORDER — ZOLPIDEM TARTRATE 5 MG PO TABS: 5.0000 mg | ORAL_TABLET | Freq: Every evening | ORAL | Status: DC | PRN

## 2018-06-28 MED ORDER — HEPARIN (PORCINE) 25000 UT/250ML-% IV SOLN
1000.0000 [IU]/h | INTRAVENOUS | Status: DC
  Administered 2018-06-28: 1150 [IU]/h via INTRAVENOUS
  Administered 2018-06-28: 1300 [IU]/h via INTRAVENOUS
  Filled 2018-06-28 (×2): qty 250

## 2018-06-28 MED ORDER — ASPIRIN EC 81 MG PO TBEC
81.0000 mg | DELAYED_RELEASE_TABLET | Freq: Every day | ORAL | Status: DC
  Administered 2018-06-28: 81 mg via ORAL
  Filled 2018-06-28: qty 1

## 2018-06-28 MED ORDER — AMLODIPINE BESYLATE 5 MG PO TABS
7.5000 mg | ORAL_TABLET | Freq: Every day | ORAL | Status: DC
  Administered 2018-06-28: 7.5 mg via ORAL
  Filled 2018-06-28: qty 1

## 2018-06-28 NOTE — Progress Notes (Signed)
  Echocardiogram  2D Echocardiogram has been performed.  Elery Cadenhead L Androw 06/28/2018, 3:27 PM

## 2018-06-28 NOTE — Progress Notes (Signed)
ANTICOAGULATION CONSULT NOTE  Pharmacy Consult for Heparin Indication: pulmonary embolus  No Known Allergies  Patient Measurements: Height: 6' (182.9 cm) Weight: 164 lb 7.4 oz (74.6 kg) IBW/kg (Calculated) : 77.6  Ht: 72 in Wt: 77.2 kg IBW: 77.6 kg  Vital Signs: Temp: 98.8 F (37.1 C) (01/13 0325) Temp Source: Oral (01/13 0325) BP: 157/72 (01/13 0325) Pulse Rate: 85 (01/13 0325)  Labs: Recent Labs    06/27/18 2335 06/28/18 0402 06/28/18 1001  HGB 10.0*  --   --   HCT 30.5*  --   --   PLT 247  --   --   HEPARINUNFRC  --   --  0.86*  CREATININE 1.87* 1.77*  --     Estimated Creatinine Clearance: 33.4 mL/min (A) (by C-G formula based on SCr of 1.77 mg/dL (H)).   Medical History: Past Medical History:  Diagnosis Date  . Amaurosis fugax of right eye 09/03/2016  . Carotid stenosis 09/03/2016   Mild 05/2016  . Chronic combined systolic and diastolic heart failure (HCC) 10/23/2016  . Essential hypertension 09/03/2016  . Hyperlipidemia   . Hypertension      Assessment: 85 y.o. M presents with weakness and L ribcage pain. Found to have a PE on heparin. -initial heparin level= 0.86  Goal of Therapy:  Heparin level 0.3-0.7 units/ml Monitor platelets by anticoagulation protocol: Yes   Plan:  -Decrease heparin to 1150 units/hr -Heparin level in 8 hours and daily wth CBC daily  Harland German, PharmD Clinical Pharmacist **Pharmacist phone directory can now be found on [email or web address] ([email or web address]).  Listed under [facility].

## 2018-06-28 NOTE — Care Management Obs Status (Signed)
MEDICARE OBSERVATION STATUS NOTIFICATION   Patient Details  Name: Stephen Chavez MRN: [id number] Date of Birth: Jul 08, 1932   Medicare Observation Status Notification Given:  Yes    Gala Lewandowsky, RN 06/28/2018, 4:36 PM

## 2018-06-28 NOTE — ED Notes (Signed)
Patient transported to CT 

## 2018-06-28 NOTE — Progress Notes (Signed)
ANTICOAGULATION CONSULT NOTE - Initial Consult  Pharmacy Consult for Heparin Indication: pulmonary embolus  No Known Allergies  Patient Measurements:    Ht: 72 in Wt: 77.2 kg IBW: 77.6 kg  Vital Signs: Temp: 98.8 F (37.1 C) (01/12 2257) Temp Source: Oral (01/12 2257) BP: 126/86 (01/13 0130) Pulse Rate: 86 (01/13 0130)  Labs: Recent Labs    06/27/18 2335  HGB 10.0*  HCT 30.5*  PLT 247  CREATININE 1.87*    CrCl cannot be calculated (Unknown ideal weight.).   Medical History: Past Medical History:  Diagnosis Date  . Amaurosis fugax of right eye 09/03/2016  . Carotid stenosis 09/03/2016   Mild 05/2016  . Chronic combined systolic and diastolic heart failure (HCC) 10/23/2016  . Essential hypertension 09/03/2016  . Hyperlipidemia   . Hypertension     Medications:  See home med rec  Assessment: 85 y.o. M presents with weakness and L ribcage pain. Found to have PE. To begin heparin per pharmacy. Hgb 10, plt wnl. No AC PTA.  Goal of Therapy:  Heparin level 0.3-0.7 units/ml Monitor platelets by anticoagulation protocol: Yes   Plan:  Heparin IV bolus 5000 units Heparin gtt at 1300 units/hr Will f/u heparin level in 8 hours Daily heparin level and CBC  Christoper Fabian, PharmD, BCPS Clinical pharmacist  **Pharmacist phone directory can now be found on [email or web address] ([email or web address]).  Listed under [facility]. 06/28/2018,2:07 AM

## 2018-06-28 NOTE — Care Management (Signed)
#   9.   S/W   AULANDER   @ [facility] # [phone]   1. ELIQUIS  2.5 MG  BID COVER- YES CO-PAY- $ 25.00  Q/L TWO PILL PER DAY TIER-  2 DRUG PRIOR APPROVAL- NO  2. ELIQUIS  5 MG BID  COVER- YES CO-PAY- $ 25.00   Q/L TWO PILL PER DAY TIER- 2 DRUG PRIOR APPROVAL- NO  3. XARELTO 15 MG  BID COVER- YES CO-PAY- $ 25.00  Q/L  TWO PILL PER DAY TIER- 2 DRUG PRIOR APPROVAL- NO  4. XARELTO  20 MG DAILY COVER- YES CO-PAY- $ 25.00 TIER0 2 DRUG PRIOR APPROVAL- NO      PREFERRED PHARMACY : YES [facility] AND [facility]

## 2018-06-28 NOTE — Progress Notes (Signed)
Patient admitted after midnight, please see H&P.  Here with weakness, chest pain worse with deep breath.  CTA positive for PE.  On heparin gtt-- await echo and benefits check.  Home in AM? Marlin Canary DO

## 2018-06-28 NOTE — Progress Notes (Signed)
VASCULAR LAB PRELIMINARY  PRELIMINARY  PRELIMINARY  PRELIMINARY  Bilateral lower extremity venous duplex completed.    Preliminary report:  There is no DVT or SVT noted in the bilateral lower extremities.  Sluggish flow noted throughout.  See preliminary report in CV Proc  Kimorah Ridolfi, RVT 06/28/2018, 3:51 PM

## 2018-06-28 NOTE — H&P (Signed)
History and Physical    Stephen Chavez OEH:[id number] DOB: 1933/02/12 DOA: 06/27/2018  Referring MD/NP/PA:   PCP: [name], MD   Patient coming from:  The patient is coming from home.  At baseline, pt is independent for most of ADL.        Chief Complaint: Chest pain, shortness of breath  HPI: Stephen Chavez is a 85 y.o. male with medical history significant of hypertension, hyperlipidemia, CHF with EF of 45%, gout, carotid artery stenosis, amaurosis fugax, who presents with chest pain shortness of breath.  Patient states that he has been having shortness of breath and chest pain in the past 3 days.  The chest pain is located in the left central chest, constant, sharp, pleuritic, aggravated with deep breath.  No tenderness in the calf areas.  Patient has a mild dry cough, no fever.  He has chills.  Patient states that his shortness of breath worsened today.  He has some mild nausea, but no vomiting, diarrhea or abdominal pain.  Patient has increased urinary frequency, but no dysuria or burning on urination.  No recent fall or head injury.  Of note, patient had crossing country traveling by driving from August to November. Pt states that he was treated with prednisone for gout recently.  Currently no gout flareup symptoms.  ED Course: pt was found to have WBC 13.2, negative troponin, BNP 13.5, creatinine 1.87, BUN 18, temperature normal, tachycardia, tachypnea, oxygen saturation 92% on room air, positive d-dimer.  Chest x-ray negative.  CT angiogram showed multiple segmental and subsegmental bilateral PE with moderate clot burden.  Patient is placed telemetry bed of observation.  Review of Systems:   General: no fevers, has chills, no body weight gain, has fatigue HEENT: no blurry vision, hearing changes or sore throat Respiratory: has dyspnea, coughing, no wheezing CV: Has chest pain, no palpitations GI: no nausea, vomiting, abdominal pain, diarrhea, constipation GU: no dysuria, burning on  urination, increased urinary frequency, hematuria  Ext: no leg edema Neuro: no unilateral weakness, numbness, or tingling, no vision change or hearing loss Skin: no rash, no skin tear. MSK: No muscle spasm, no deformity, no limitation of range of movement in spin Heme: No easy bruising.  Travel history: has recent long distant travel.  Allergy: No Known Allergies  Past Medical History:  Diagnosis Date  . Amaurosis fugax of right eye 09/03/2016  . Carotid stenosis 09/03/2016   Mild 05/2016  . Chronic combined systolic and diastolic heart failure (HCC) 10/23/2016  . Essential hypertension 09/03/2016  . Hyperlipidemia   . Hypertension     Past Surgical History:  Procedure Laterality Date  . EYE SURGERY    . polyp removal      Social History:  reports that he has never smoked. He has never used smokeless tobacco. He reports current alcohol use of about 4.0 standard drinks of alcohol per week. He reports that he does not use drugs.  Family History:  Family History  Problem Relation Age of Onset  . Hypertension Mother      Prior to Admission medications   Medication Sig Start Date End Date Taking? Authorizing Provider  albuterol (PROVENTIL HFA;VENTOLIN HFA) 108 (90 Base) MCG/ACT inhaler Inhale 1-2 puffs into the lungs every 6 (six) hours as needed for wheezing or shortness of breath.   Yes [provider]  allopurinol (ZYLOPRIM) 300 MG tablet Take 300 mg by mouth every morning.   Yes [provider]  amLODipine (NORVASC) 5 MG tablet TAKE  4 TABLETS BY MOUTH EVERY DAY. NEEDS OFFICE VISIT Patient taking differently: Take 7.5 mg by mouth at bedtime.  05/25/18  Yes Chilton Si, MD  aspirin EC 81 MG tablet Take 81 mg by mouth at bedtime.    Yes [provider]  fluticasone (FLONASE) 50 MCG/ACT nasal spray Place 2 sprays into both nostrils daily as needed for allergies or rhinitis.   Yes [provider]  loratadine (CLARITIN) 10 MG tablet Take 10 mg  by mouth at bedtime.   Yes [provider]  pravastatin (PRAVACHOL) 80 MG tablet Take 80 mg by mouth at bedtime.    Yes [provider]  psyllium (METAMUCIL) 58.6 % powder Take 1 packet by mouth at bedtime.   Yes [provider]    Physical Exam: Vitals:   06/28/18 0200 06/28/18 0212 06/28/18 0215 06/28/18 0230  BP: 129/64  130/68 (!) 145/71  Pulse: 80  79 81  Resp: 16  14 16   Temp:      TempSrc:      SpO2: 95%  95% 93%  Weight:  77.1 kg    Height:  6' (1.829 m)     General: Not in acute distress HEENT:       Eyes: PERRL, EOMI, no scleral icterus.       ENT: No discharge from the ears and nose, no pharynx injection, no tonsillar enlargement.        Neck: No JVD, no bruit, no mass felt. Heme: No neck lymph node enlargement. Cardiac: S1/S2, RRR, No murmurs, No gallops or rubs. Respiratory: No rales, wheezing, rhonchi or rubs. GI: Soft, nondistended, nontender, no rebound pain, no organomegaly, BS present. GU: No hematuria Ext: No pitting leg edema bilaterally. 2+DP/PT pulse bilaterally. Musculoskeletal: No joint deformities, No joint redness or warmth, no limitation of ROM in spin. Skin: No rashes.  Neuro: Alert, oriented X3, cranial nerves II-XII grossly intact, moves all extremities normally. Psych: Patient is not psychotic, no suicidal or hemocidal ideation.  Labs on Admission: I have personally reviewed following labs and imaging studies  CBC: Recent Labs  Lab 06/27/18 2335  WBC 13.2*  NEUTROABS 10.5*  HGB 10.0*  HCT 30.5*  MCV 81.6  PLT 247   Basic Metabolic Panel: Recent Labs  Lab 06/27/18 2335  NA 136  K 4.1  CL 105  CO2 22  GLUCOSE 127*  BUN 18  CREATININE 1.87*  CALCIUM 8.8*   GFR: Estimated Creatinine Clearance: 32.6 mL/min (A) (by C-G formula based on SCr of 1.87 mg/dL (H)). Liver Function Tests: No results for input(s): AST, ALT, ALKPHOS, BILITOT, PROT, ALBUMIN in the last 168 hours. No results for input(s): LIPASE,  AMYLASE in the last 168 hours. No results for input(s): AMMONIA in the last 168 hours. Coagulation Profile: No results for input(s): INR, PROTIME in the last 168 hours. Cardiac Enzymes: No results for input(s): CKTOTAL, CKMB, CKMBINDEX, TROPONINI in the last 168 hours. BNP (last 3 results) No results for input(s): PROBNP in the last 8760 hours. HbA1C: No results for input(s): HGBA1C in the last 72 hours. CBG: No results for input(s): GLUCAP in the last 168 hours. Lipid Profile: No results for input(s): CHOL, HDL, LDLCALC, TRIG, CHOLHDL, LDLDIRECT in the last 72 hours. Thyroid Function Tests: No results for input(s): TSH, T4TOTAL, FREET4, T3FREE, THYROIDAB in the last 72 hours. Anemia Panel: No results for input(s): VITAMINB12, FOLATE, FERRITIN, TIBC, IRON, RETICCTPCT in the last 72 hours. Urine analysis:    Component Value Date/Time   COLORURINE  YELLOW 06/27/2018 2335   APPEARANCEUR CLEAR 06/27/2018 2335   LABSPEC 1.012 06/27/2018 2335   PHURINE 6.0 06/27/2018 2335   GLUCOSEU NEGATIVE 06/27/2018 2335   HGBUR NEGATIVE 06/27/2018 2335   BILIRUBINUR NEGATIVE 06/27/2018 2335   KETONESUR NEGATIVE 06/27/2018 2335   PROTEINUR NEGATIVE 06/27/2018 2335   NITRITE NEGATIVE 06/27/2018 2335   LEUKOCYTESUR NEGATIVE 06/27/2018 2335   Sepsis Labs: @LABRCNTIP (procalcitonin:4,lacticidven:4) )No results found for this or any previous visit (from the past 240 hour(s)).   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 Lungs are otherwise clear. No pleural effusion or pneumothorax. The heart is normal in size. Mild degenerative changes of the visualized thoracolumbar spine. IMPRESSION: No evidence of acute cardiopulmonary disease. Electronically Signed   By: Charline Bills M.D.   On: 06/27/2018 23:40   Ct Angio Chest Pe W And/or Wo Contrast  Result Date: 06/28/2018 CLINICAL DATA:  Shortness of breath, left chest pain, weakness EXAM: CT ANGIOGRAPHY CHEST WITH CONTRAST TECHNIQUE: Multidetector CT imaging of the chest was performed using the standard protocol during bolus administration of intravenous contrast. Multiplanar CT image reconstructions and MIPs were obtained to evaluate the vascular anatomy. CONTRAST:  62mL ISOVUE-370 IOPAMIDOL (ISOVUE-370) INJECTION 76% COMPARISON:  Chest radiograph dated 06/27/2018. CT chest dated 04/30/2018. FINDINGS: Cardiovascular: Satisfactory opacification of the bilateral pulmonary arteries to the segmental level. Multiple segmental add segmental pulmonary emboli in all lobes (for example, series 6/images 91, 110, 134, 169, 190, and 197). Overall clot burden is moderate. No evidence of thoracic aortic aneurysm or dissection. Mild atherosclerotic calcifications of the aortic arch. The heart is normal in size.  No pericardial effusion. Mild coronary atherosclerosis of the LAD. Mediastinum/Nodes: No suspicious mediastinal lymphadenopathy. Visualized thyroid is unremarkable. Lungs/Pleura: Small left pleural effusion. Associated left basilar atelectasis. 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 Mild subpleural reticulation/fibrosis in the lungs bilaterally. Mild centrilobular and paraseptal emphysematous changes. No suspicious pulmonary nodules. No pneumothorax. Upper Abdomen: Scattered probable hepatic cysts measuring up to 15 mm (series 5/image 89), unchanged. Musculoskeletal: Degenerative changes of the thoracic spine. Review of the MIP images confirms the above findings. IMPRESSION: Multiple segmental and subsegmental pulmonary emboli in the lungs bilaterally, as above. Overall clot burden is moderate. Critical Value/emergent results were called by telephone at the  time of interpretation on 06/28/2018 at 2:05 am to Dr. Rochele Raring, who verbally acknowledged these results. Aortic Atherosclerosis (ICD10-I70.0) and Emphysema (ICD10-J43.9). Electronically Signed   By: Charline Bills M.D.   On: 06/28/2018 02:07     EKG: Independently reviewed.  Sinus rhythm, QTC 438, LAD, normal specific T wave change.   Assessment/Plan Principal Problem:   PE (pulmonary thromboembolism) (HCC) Active Problems:   Essential hypertension   Chronic combined systolic and diastolic heart failure (HCC)   HLD (hyperlipidemia)   AKI (acute kidney injury) (HCC)   Leukocytosis   PE (pulmonary thromboembolism) (HCC): CTA showed multiple segmental and subsegmental pulmonary emboli in the lungs bilaterally with moderate overall clot burden.  Patient is hemodynamically stable currently. BP 13.5  -will place on tele bed for obs -heparin drip initiated -2D echocardiogram ordered -LE dopplers ordered to evaluate for DVT -pain control: When necessary Percocet -prn xopenex nebs and mucinex  Chronic combined systolic and diastolic  heart failure: 2D echo on 09/23/2016 showed EF of 45-50% with grade 1 diastolic dysfunction.  Patient does not have leg edema JVD.  No pulmonary edema on chest x-ray.  BNP 13.5.  CHF is compensated. Patient is not taking diuretics. -Continue aspirin.   HTN:  -Continue home medications: Amlodipine, -IV hydralazine prn  HLD (hyperlipidemia): -Pravastatin.  AKI (acute kidney injury) (HCC) vs. CKD-III: Creatinine 1.87, BUN 18. GFR 38. No baseline creatinine available. -Follow-up renal function by BMP -Patient received 500 cc normal saline bolus in the ED  Leukocytosis: no fever or other signs of infection. Likely due to stress induced to demargination. UA negative. CXR negative. -follow up by CBC    DVT ppx: on IV Heparin   Code Status: Full code Family Communication:  Yes, patient's wife  bed side Disposition Plan:  Anticipate discharge back to  previous home environment Consults called:  none Admission status: Obs / tele     Date of Service 06/28/2018    Lorretta HarpXilin Pamila Mendibles [facility] Pager [phone]  If 7PM-7AM, please contact night-coverage [email or web address] Password [email or web address] 06/28/2018, 3:05 AM

## 2018-06-28 NOTE — Care Management (Signed)
1528 06-28-18 Tomi Bamberger, RN,BSN [phone] CM submitted benefits check for Xarelto/ Eliquis. Will make patient aware of cost once completed.

## 2018-06-28 NOTE — Progress Notes (Signed)
ANTICOAGULATION CONSULT NOTE  Pharmacy Consult for Heparin Indication: pulmonary embolus  No Known Allergies  Patient Measurements: Height: 6' (182.9 cm) Weight: 164 lb 7.4 oz (74.6 kg) IBW/kg (Calculated) : 77.6  Ht: 72 in Wt: 77.2 kg IBW: 77.6 kg  Vital Signs: Temp: 98.4 F (36.9 C) (01/13 2049) Temp Source: Oral (01/13 2049) BP: 135/68 (01/13 2049) Pulse Rate: 81 (01/13 2049)  Labs: Recent Labs    06/27/18 2335 06/28/18 0402 06/28/18 1001 06/28/18 1929  HGB 10.0*  --   --   --   HCT 30.5*  --   --   --   PLT 247  --   --   --   HEPARINUNFRC  --   --  0.86* 0.78*  CREATININE 1.87* 1.77*  --   --     Estimated Creatinine Clearance: 33.4 mL/min (A) (by C-G formula based on SCr of 1.77 mg/dL (H)).   Medical History: Past Medical History:  Diagnosis Date  . Amaurosis fugax of right eye 09/03/2016  . Carotid stenosis 09/03/2016   Mild 05/2016  . Chronic combined systolic and diastolic heart failure (HCC) 10/23/2016  . Essential hypertension 09/03/2016  . Hyperlipidemia   . Hypertension      Assessment: 85 y.o. M presents with weakness and L ribcage pain. Found to have a PE started on heparin.  Follow up heparin level this evening is still above goal at 0.78 on 1150 units/hr. No bleeding issues noted, will adjust heparin rate.   Goal of Therapy:  Heparin level 0.3-0.7 units/ml Monitor platelets by anticoagulation protocol: Yes   Plan:  -Decrease heparin to 1000 units/hr -Heparin level daily wth CBC daily  Sheppard Coil PharmD., BCPS Clinical Pharmacist 06/28/2018 8:58 PM

## 2018-06-29 DIAGNOSIS — I10 Essential (primary) hypertension: Secondary | ICD-10-CM | POA: Diagnosis not present

## 2018-06-29 DIAGNOSIS — I2699 Other pulmonary embolism without acute cor pulmonale: Secondary | ICD-10-CM | POA: Diagnosis not present

## 2018-06-29 LAB — CBC
HCT: 27.2 % — ABNORMAL LOW (ref 39.0–52.0)
Hemoglobin: 9.1 g/dL — ABNORMAL LOW (ref 13.0–17.0)
MCH: 27.1 pg (ref 26.0–34.0)
MCHC: 33.5 g/dL (ref 30.0–36.0)
MCV: 81 fL (ref 80.0–100.0)
PLATELETS: 223 10*3/uL (ref 150–400)
RBC: 3.36 MIL/uL — ABNORMAL LOW (ref 4.22–5.81)
RDW: 15.4 % (ref 11.5–15.5)
WBC: 11.8 10*3/uL — ABNORMAL HIGH (ref 4.0–10.5)
nRBC: 0 % (ref 0.0–0.2)

## 2018-06-29 LAB — HEPARIN LEVEL (UNFRACTIONATED): Heparin Unfractionated: 0.46 IU/mL (ref 0.30–0.70)

## 2018-06-29 LAB — BASIC METABOLIC PANEL
Anion gap: 8 (ref 5–15)
BUN: 16 mg/dL (ref 8–23)
CO2: 22 mmol/L (ref 22–32)
Calcium: 8.4 mg/dL — ABNORMAL LOW (ref 8.9–10.3)
Chloride: 107 mmol/L (ref 98–111)
Creatinine, Ser: 1.65 mg/dL — ABNORMAL HIGH (ref 0.61–1.24)
GFR calc non Af Amer: 38 mL/min — ABNORMAL LOW (ref >60)
GFR, EST AFRICAN AMERICAN: 44 mL/min — AB (ref >60)
Glucose, Bld: 109 mg/dL — ABNORMAL HIGH (ref 70–99)
Potassium: 4.2 mmol/L (ref 3.5–5.1)
SODIUM: 137 mmol/L (ref 135–145)

## 2018-06-29 LAB — URINE CULTURE: Culture: <10000 — AB

## 2018-06-29 MED ORDER — ELIQUIS 5 MG VTE STARTER PACK: ORAL_TABLET | ORAL | 0 refills | Status: DC

## 2018-06-29 MED ORDER — APIXABAN 5 MG PO TABS
10.0000 mg | ORAL_TABLET | Freq: Two times a day (BID) | ORAL | Status: DC
  Administered 2018-06-29: 10 mg via ORAL
  Filled 2018-06-29: qty 2

## 2018-06-29 MED ORDER — OXYCODONE-ACETAMINOPHEN 5-325 MG PO TABS: 1.0000 | ORAL_TABLET | ORAL | 0 refills | Status: DC | PRN

## 2018-06-29 MED ORDER — AMLODIPINE BESYLATE 5 MG PO TABS: 7.5000 mg | ORAL_TABLET | Freq: Every day | ORAL | Status: DC

## 2018-06-29 MED ORDER — APIXABAN 5 MG PO TABS: 5.0000 mg | ORAL_TABLET | Freq: Two times a day (BID) | ORAL | Status: DC

## 2018-06-29 MED ORDER — ACETAMINOPHEN 325 MG PO TABS: 650.0000 mg | ORAL_TABLET | Freq: Four times a day (QID) | ORAL | Status: DC | PRN

## 2018-06-29 MED FILL — ELIQUIS STARTER PACK 5 MG T: 5 | 30 days supply | Qty: 74 | Fill #0

## 2018-06-29 NOTE — Progress Notes (Signed)
Patient ambulated 350 feet in hallway. Steady gait. Maintained oxygen saturations >94% at all times on room air; no complaints of shortness of breath.

## 2018-06-29 NOTE — Progress Notes (Signed)
ANTICOAGULATION CONSULT NOTE  Pharmacy Consult for Heparin Indication: pulmonary embolus  No Known Allergies  Patient Measurements: Height: 6' (182.9 cm) Weight: 160 lb 4.8 oz (72.7 kg) IBW/kg (Calculated) : 77.6  Ht: 72 in Wt: 77.2 kg IBW: 77.6 kg  Vital Signs: Temp: 98.8 F (37.1 C) (01/14 0650) Temp Source: Oral (01/14 0650) BP: 123/74 (01/14 0650) Pulse Rate: 78 (01/14 0650)  Labs: Recent Labs    06/27/18 2335 06/28/18 0402 06/28/18 1001 06/28/18 1929 06/29/18 0402  HGB 10.0*  --   --   --  9.1*  HCT 30.5*  --   --   --  27.2*  PLT 247  --   --   --  223  HEPARINUNFRC  --   --  0.86* 0.78* 0.46  CREATININE 1.87* 1.77*  --   --  1.65*    Estimated Creatinine Clearance: 34.9 mL/min (A) (by C-G formula based on SCr of 1.65 mg/dL (H)).   Medical History: Past Medical History:  Diagnosis Date  . Amaurosis fugax of right eye 09/03/2016  . Carotid stenosis 09/03/2016   Mild 05/2016  . Chronic combined systolic and diastolic heart failure (HCC) 10/23/2016  . Essential hypertension 09/03/2016  . Hyperlipidemia   . Hypertension      Assessment: 85 y.o. M presents with weakness and L ribcage pain. Found to have a PE started on heparin. Plans to transition to apixaban today -SCr= 1.65 (trend down, CrCL ~ 35), hg= 9.1   Goal of Therapy:  Heparin level 0.3-0.7 units/ml Monitor platelets by anticoagulation protocol: Yes   Plan:  -Discontinue heparin  -apixaban 10mg  po bid for 7 days then 5mg  po bid  Harland German, PharmD Clinical Pharmacist **Pharmacist phone directory can now be found on [email or web address] (PW [email or web address]).  Listed under [facility].

## 2018-06-29 NOTE — Discharge Summary (Signed)
Physician Discharge Summary  Stephen Chavez NUU:[id number] DOB: [birth year] DOA: 06/27/2018  PCP: [name], MD  Admit date: 06/27/2018 Discharge date: 06/29/2018  Admitted From: home Discharge disposition: home   Recommendations for Outpatient Follow-Up:   1. eliquis started for PE (ASA held for now) 2. BMP 1 week re Cr-- ? baseline   Discharge Diagnosis:   Principal Problem:   PE (pulmonary thromboembolism) (HCC) Active Problems:   Essential hypertension   Chronic combined systolic and diastolic heart failure (HCC)   HLD (hyperlipidemia)   AKI (acute kidney injury) (HCC)   Leukocytosis    Discharge Condition: Improved.  Diet recommendation: Low sodium, heart healthy  Wound care: None.  Code status: Full.   History of Present Illness:   Stephen Chavez is a 85 y.o. male with medical history significant of hypertension, hyperlipidemia, CHF with EF of 45%, gout, carotid artery stenosis, amaurosis fugax, who presents with chest pain shortness of breath.  Patient states that he has been having shortness of breath and chest pain in the past 3 days.  The chest pain is located in the left central chest, constant, sharp, pleuritic, aggravated with deep breath.  No tenderness in the calf areas.  Patient has a mild dry cough, no fever.  He has chills.  Patient states that his shortness of breath worsened today.  He has some mild nausea, but no vomiting, diarrhea or abdominal pain.  Patient has increased urinary frequency, but no dysuria or burning on urination.  No recent fall or head injury.  Of note, patient had crossing country traveling by driving from August to November. Pt states that he was treated with prednisone for gout recently.  Currently no gout flareup symptoms.   Hospital Course by Problem:   PE (pulmonary thromboembolism) (HCC):  -CTA showed multiple segmental and subsegmental pulmonary emboli in the lungs bilaterally with moderate overall clot burden.     -heparin drip initiated-- change to eliquis -2D echocardiogram with improved EF from prior -LE dopplers showed sluggish flow -patient drove cross country around thanksgiving  Chronic combined systolic and diastolic heart failure: -stable, EF actually improved  HTN:   -Continue home medications  HLD (hyperlipidemia): -Pravastatin.  CKD-III:  -CR appears stable Outpatient follow up  Leukocytosis: - Likely due to stress induced to demargination    Medical Consultants:      Discharge Exam:   Vitals:   06/28/18 2049 06/29/18 0650  BP: 135/68 123/74  Pulse: 81 78  Resp: 17 12  Temp: 98.4 F (36.9 C) 98.8 F (37.1 C)  SpO2: 96% 97%   Vitals:   06/28/18 0230 06/28/18 0325 06/28/18 2049 06/29/18 0650  BP: (!) 145/71 (!) 157/72 135/68 123/74  Pulse: 81 85 81 78  Resp: 16 10 17 12   Temp:  98.8 F (37.1 C) 98.4 F (36.9 C) 98.8 F (37.1 C)  TempSrc:  Oral Oral Oral  SpO2: 93% 94% 96% 97%  Weight:  74.6 kg  72.7 kg  Height:        General exam: Appears calm and comfortable.     The results of significant diagnostics from this hospitalization (including imaging, microbiology, ancillary and laboratory) are listed below for reference.     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 Lungs are otherwise clear. No pleural effusion or pneumothorax. The heart is normal in size. Mild degenerative changes of the visualized thoracolumbar spine. IMPRESSION: No evidence of acute cardiopulmonary disease. Electronically Signed   By: Charline BillsSriyesh  Krishnan M.D.   On: 06/27/2018 23:40   Ct Angio Chest Pe W And/or Wo Contrast  Result Date: 06/28/2018 CLINICAL DATA:  Shortness of breath, left chest pain, weakness EXAM: CT ANGIOGRAPHY CHEST  WITH CONTRAST TECHNIQUE: Multidetector CT imaging of the chest was performed using the standard protocol during bolus administration of intravenous contrast. Multiplanar CT image reconstructions and MIPs were obtained to evaluate the vascular anatomy. CONTRAST:  55mL ISOVUE-370 IOPAMIDOL (ISOVUE-370) INJECTION 76% COMPARISON:  Chest radiograph dated 06/27/2018. CT chest dated 04/30/2018. FINDINGS: Cardiovascular: Satisfactory opacification of the bilateral pulmonary arteries to the segmental level. Multiple segmental add segmental pulmonary emboli in all lobes (for example, series 6/images 91, 110, 134, 169, 190, and 197). Overall clot burden is moderate. No evidence of thoracic aortic aneurysm or dissection. Mild atherosclerotic calcifications of the aortic arch. The heart is normal in size.  No pericardial effusion. Mild coronary atherosclerosis of the LAD. Mediastinum/Nodes: No suspicious mediastinal lymphadenopathy. Visualized thyroid is unremarkable. Lungs/Pleura: Small left pleural effusion. Associated left basilar atelectasis. 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 Mild subpleural reticulation/fibrosis in the lungs bilaterally. Mild centrilobular and paraseptal emphysematous changes. No suspicious pulmonary nodules. No pneumothorax. Upper Abdomen: Scattered probable hepatic cysts measuring up to 15 mm (series 5/image 89), unchanged. Musculoskeletal: Degenerative changes of the thoracic spine. Review of the MIP images confirms the above findings. IMPRESSION: Multiple segmental and subsegmental pulmonary emboli in the lungs bilaterally, as above. Overall clot burden is moderate. Critical Value/emergent results were called by telephone at the time of interpretation on 06/28/2018 at 2:05 am to Dr. Rochele RaringKRISTEN WARD, who verbally acknowledged these results. Aortic Atherosclerosis (ICD10-I70.0) and Emphysema (ICD10-J43.9). Electronically Signed    By: Charline BillsSriyesh  Krishnan M.D.   On: 06/28/2018 02:07   Vas [place] Lower Extremity Venous (dvt)  Result Date: 06/28/2018  Lower Venous Study Risk Factors: Confirmed PE. Comparison Study: Prior study from 06/25/11 is available for comparison Performing Technologist: Sherren Kernsandace Kanady RVS  Examination Guidelines: A complete evaluation includes B-mode imaging, spectral Doppler, color Doppler, and power Doppler as needed of all accessible portions of each vessel. Bilateral testing is considered an integral part of a complete examination. Limited examinations for reoccurring indications may be performed as noted.  Right Venous Findings: +---------+---------------+---------+-----------+----------+-------+          CompressibilityPhasicitySpontaneityPropertiesSummary +---------+---------------+---------+-----------+----------+-------+ CFV      Full           Yes      Yes                          +---------+---------------+---------+-----------+----------+-------+ SFJ      Full                                                 +---------+---------------+---------+-----------+----------+-------+ FV Prox  Full                                                 +---------+---------------+---------+-----------+----------+-------+  FV Mid   Full                                                 +---------+---------------+---------+-----------+----------+-------+ FV DistalFull                                                 +---------+---------------+---------+-----------+----------+-------+ PFV      Full                                                 +---------+---------------+---------+-----------+----------+-------+ POP      Full           Yes      Yes                          +---------+---------------+---------+-----------+----------+-------+ PTV      Full                                                 +---------+---------------+---------+-----------+----------+-------+ PERO      Full                                                 +---------+---------------+---------+-----------+----------+-------+ GSV      Full                                                 +---------+---------------+---------+-----------+----------+-------+ Sluggish flow noted throughout  Left Venous Findings: +---------+---------------+---------+-----------+----------+-------+          CompressibilityPhasicitySpontaneityPropertiesSummary +---------+---------------+---------+-----------+----------+-------+ CFV      Full           Yes      Yes                          +---------+---------------+---------+-----------+----------+-------+ SFJ      Full                                                 +---------+---------------+---------+-----------+----------+-------+ FV Prox  Full                                                 +---------+---------------+---------+-----------+----------+-------+ FV Mid   Full                                                 +---------+---------------+---------+-----------+----------+-------+  FV DistalFull                                                 +---------+---------------+---------+-----------+----------+-------+ PFV      Full                                                 +---------+---------------+---------+-----------+----------+-------+ POP      Full           Yes      Yes                          +---------+---------------+---------+-----------+----------+-------+ PTV      Full                                                 +---------+---------------+---------+-----------+----------+-------+ PERO     Full                                                 +---------+---------------+---------+-----------+----------+-------+ GSV      Full                                                 +---------+---------------+---------+-----------+----------+-------+ Sluggish flow noted throughout    *See  table(s) above for measurements and observations. Electronically signed by Waverly Ferrarihristopher Dickson MD on 06/28/2018 at 5:08:59 PM.    Final      Labs:   Basic Metabolic Panel: Recent Labs  Lab 06/27/18 2335 06/28/18 0402 06/29/18 0402  NA 136 136 137  K 4.1 4.4 4.2  CL 105 105 107  CO2 22 23 22   GLUCOSE 127* 152* 109*  BUN 18 16 16   CREATININE 1.87* 1.77* 1.65*  CALCIUM 8.8* 8.4* 8.4*   GFR Estimated Creatinine Clearance: 34.9 mL/min (A) (by C-G formula based on SCr of 1.65 mg/dL (H)). Liver Function Tests: No results for input(s): AST, ALT, ALKPHOS, BILITOT, PROT, ALBUMIN in the last 168 hours. No results for input(s): LIPASE, AMYLASE in the last 168 hours. No results for input(s): AMMONIA in the last 168 hours. Coagulation profile No results for input(s): INR, PROTIME in the last 168 hours.  CBC: Recent Labs  Lab 06/27/18 2335 06/29/18 0402  WBC 13.2* 11.8*  NEUTROABS 10.5*  --   HGB 10.0* 9.1*  HCT 30.5* 27.2*  MCV 81.6 81.0  PLT 247 223   Cardiac Enzymes: No results for input(s): CKTOTAL, CKMB, CKMBINDEX, TROPONINI in the last 168 hours. BNP: Invalid input(s): POCBNP CBG: No results for input(s): GLUCAP in the last 168 hours. D-Dimer Recent Labs    06/27/18 2335  DDIMER 3.36*   Hgb A1c No results for input(s): HGBA1C in the last 72 hours. Lipid Profile No results for input(s): CHOL, HDL, LDLCALC, TRIG, CHOLHDL, LDLDIRECT in the last 72 hours. Thyroid function studies No  results for input(s): TSH, T4TOTAL, T3FREE, THYROIDAB in the last 72 hours.  Invalid input(s): FREET3 Anemia work up No results for input(s): VITAMINB12, FOLATE, FERRITIN, TIBC, IRON, RETICCTPCT in the last 72 hours. Microbiology Recent Results (from the past 240 hour(s))  Urine culture     Status: Abnormal   Collection Time: 06/27/18 11:35 PM  Result Value Ref Range Status   Specimen Description URINE, CLEAN CATCH  Final   Special Requests NONE  Final   Culture (A)  Final     <10,000 COLONIES/mL INSIGNIFICANT GROWTH Performed at [facility], [address] [place], [place] [address]    Report Status 06/29/2018 FINAL  Final     Discharge Instructions:   Discharge Instructions    Diet - low sodium heart healthy   Complete by:  As directed    Discharge instructions   Complete by:  As directed    Have sent in a medication for pain in case tylenol does not help-- no driving while taking the oxycodone as it may make you very sleepy   Increase activity slowly   Complete by:  As directed      Allergies as of 06/29/2018   No Known Allergies     Medication List    STOP taking these medications   aspirin EC 81 MG tablet     TAKE these medications   acetaminophen 325 MG tablet Commonly known as:  TYLENOL Take 2 tablets (650 mg total) by mouth every 6 (six) hours as needed for mild pain (or Fever >/= 101).   albuterol 108 (90 Base) MCG/ACT inhaler Commonly known as:  PROVENTIL HFA;VENTOLIN HFA Inhale 1-2 puffs into the lungs every 6 (six) hours as needed for wheezing or shortness of breath.   allopurinol 300 MG tablet Commonly known as:  ZYLOPRIM Take 300 mg by mouth every morning.   amLODipine 5 MG tablet Commonly known as:  NORVASC Take 1.5 tablets (7.5 mg total) by mouth at bedtime. What changed:  See the new instructions.   ELIQUIS STARTER PACK 5 MG Tabs Take as directed on package: start with two-5mg  tablets twice daily for 7 days. On day 8, switch to one-5mg  tablet twice daily.   fluticasone 50 MCG/ACT nasal spray Commonly known as:  FLONASE Place 2 sprays into both nostrils daily as needed for allergies or rhinitis.   loratadine 10 MG tablet Commonly known as:  CLARITIN Take 10 mg by mouth at bedtime.   oxyCODONE-acetaminophen 5-325 MG tablet Commonly known as:  PERCOCET/ROXICET Take 1 tablet by mouth every 4 (four) hours as needed for moderate pain.   pravastatin 80 MG tablet Commonly known as:  PRAVACHOL Take 80 mg by  mouth at bedtime.   psyllium 58.6 % powder Commonly known as:  METAMUCIL Take 1 packet by mouth at bedtime.         Time coordinating discharge: 35 min  Signed:  Joseph Art DO  [facility] 06/29/2018, 11:01 AM

## 2018-07-26 ENCOUNTER — Other Ambulatory Visit: Payer: Self-pay

## 2018-07-26 MED ORDER — AMLODIPINE BESYLATE 5 MG PO TABS: ORAL_TABLET | ORAL | 0 refills | Status: DC

## 2018-07-26 MED ORDER — AMLODIPINE BESYLATE 5 MG PO TABS: 7.5000 mg | ORAL_TABLET | Freq: Every day | ORAL | 0 refills | Status: DC

## 2018-09-14 ENCOUNTER — Encounter (HOSPITAL_COMMUNITY): Payer: Medicare Other

## 2018-10-01 ENCOUNTER — Other Ambulatory Visit: Payer: Self-pay | Admitting: Family Medicine

## 2018-10-01 DIAGNOSIS — K8689 Other specified diseases of pancreas: Secondary | ICD-10-CM

## 2018-11-29 ENCOUNTER — Other Ambulatory Visit: Payer: Self-pay

## 2018-11-29 ENCOUNTER — Ambulatory Visit
Admission: RE | Admit: 2018-11-29 | Discharge: 2018-11-29 | Disposition: A | Discharge status: Home / Self Care | Payer: Medicare Other | Source: Ambulatory Visit | Attending: Family Medicine | Admitting: Family Medicine

## 2018-11-29 DIAGNOSIS — K8689 Other specified diseases of pancreas: Secondary | ICD-10-CM

## 2018-11-29 MED ORDER — GADOBENATE DIMEGLUMINE 529 MG/ML IV SOLN
7.0000 mL | Freq: Once | INTRAVENOUS | Status: AC | PRN
  Administered 2018-11-29: 7 mL via INTRAVENOUS

## 2018-12-06 ENCOUNTER — Other Ambulatory Visit (HOSPITAL_COMMUNITY): Payer: Self-pay | Admitting: *Deleted

## 2018-12-07 ENCOUNTER — Ambulatory Visit (HOSPITAL_COMMUNITY)
Admission: RE | Admit: 2018-12-07 | Discharge: 2018-12-07 | Disposition: A | Discharge status: Home / Self Care | Payer: Medicare Other | Source: Ambulatory Visit | Attending: Gastroenterology | Admitting: Gastroenterology

## 2018-12-07 ENCOUNTER — Other Ambulatory Visit: Payer: Self-pay

## 2018-12-07 DIAGNOSIS — D509 Iron deficiency anemia, unspecified: Secondary | ICD-10-CM | POA: Insufficient documentation

## 2018-12-07 MED ORDER — SODIUM CHLORIDE 0.9 % IV SOLN
510.0000 mg | INTRAVENOUS | Status: DC
  Administered 2018-12-07: 510 mg via INTRAVENOUS
  Filled 2018-12-07: qty 510

## 2018-12-14 ENCOUNTER — Other Ambulatory Visit: Payer: Self-pay

## 2018-12-14 ENCOUNTER — Ambulatory Visit (HOSPITAL_COMMUNITY)
Admission: RE | Admit: 2018-12-14 | Discharge: 2018-12-14 | Disposition: A | Discharge status: Home / Self Care | Payer: Medicare Other | Source: Ambulatory Visit | Attending: Gastroenterology | Admitting: Gastroenterology

## 2018-12-14 DIAGNOSIS — D509 Iron deficiency anemia, unspecified: Secondary | ICD-10-CM | POA: Diagnosis not present

## 2018-12-14 MED ORDER — SODIUM CHLORIDE 0.9 % IV SOLN
510.0000 mg | INTRAVENOUS | Status: DC
  Administered 2018-12-14: 510 mg via INTRAVENOUS
  Filled 2018-12-14: qty 510

## 2019-02-04 IMAGING — DX DG CHEST 2V
1 series · 1 of 1 positions shown · non-contrast
Comparison: CT chest dated 04/30/2018

CLINICAL DATA: Shortness of breath

EXAM:
CHEST - 2 VIEW

[chest lat]
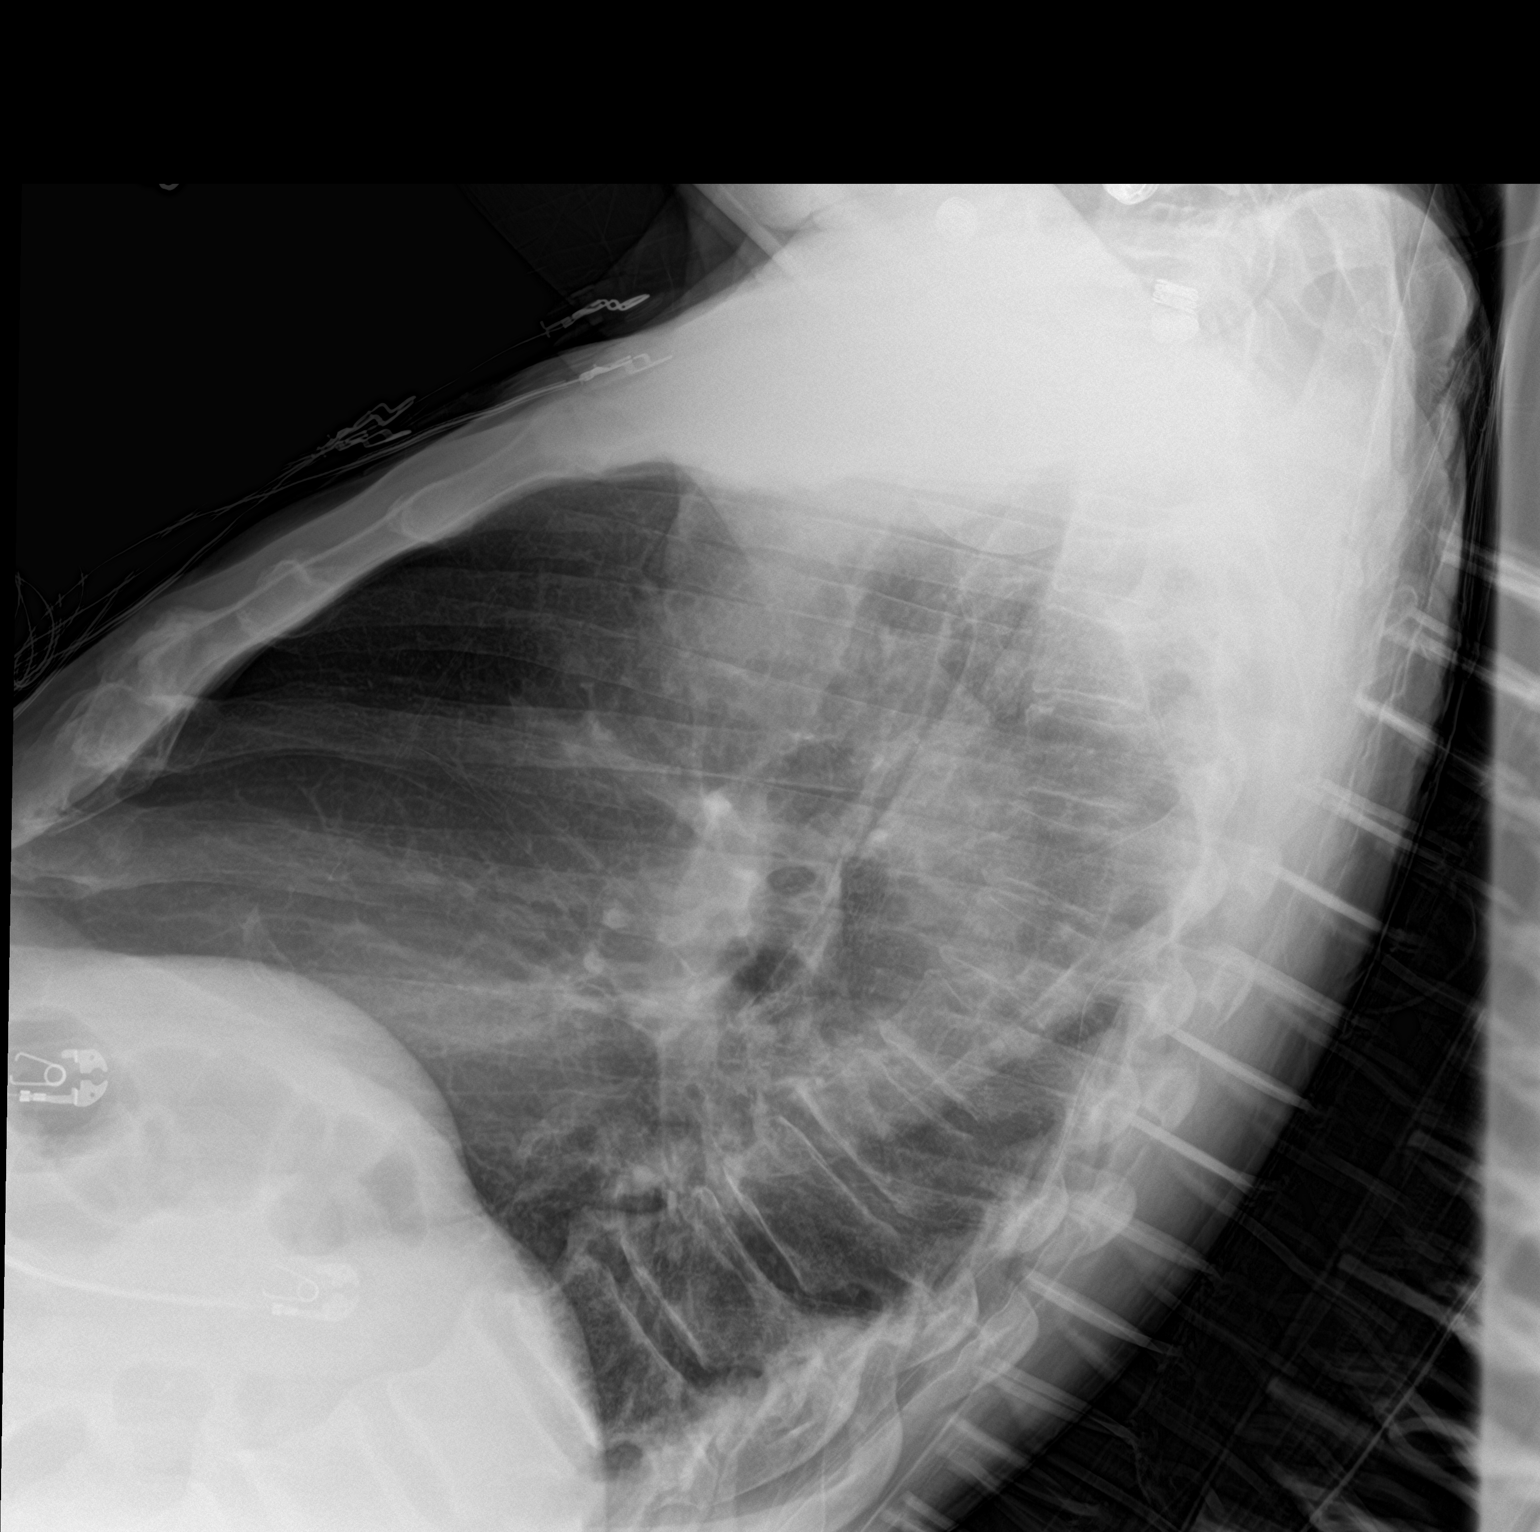

[1 of 1 positions shown; findings below may reference images not displayed]

FINDINGS: Pleural-based mass along the lateral left hemithorax, corresponding
to the known benign lipoma on CT. Lungs are otherwise clear. No
pleural effusion or pneumothorax.

The heart is normal in size.

Mild degenerative changes of the visualized thoracolumbar spine.
IMPRESSION: No evidence of acute cardiopulmonary disease.

## 2019-03-22 ENCOUNTER — Telehealth: Payer: Self-pay | Admitting: Internal Medicine

## 2019-03-22 NOTE — Telephone Encounter (Signed)
Received a new hem referral from Dr. Watt Climes for pulmonary emboli. Mr. Stephen Chavez returned my call and has been scheduled to see Dr. Julien Nordmann on 10/26 at 11:30am. Pt aware to arrive 15 minutes early.

## 2019-03-30 ENCOUNTER — Inpatient Hospital Stay (HOSPITAL_COMMUNITY)
Admission: EM | Admit: 2019-03-30 | Discharge: 2019-04-01 | DRG: 378 | Disposition: A | Discharge status: Home / Self Care | Payer: Medicare Other | Attending: Internal Medicine | Admitting: Internal Medicine

## 2019-03-30 ENCOUNTER — Encounter (HOSPITAL_COMMUNITY): Payer: Self-pay | Admitting: Emergency Medicine

## 2019-03-30 ENCOUNTER — Other Ambulatory Visit: Payer: Self-pay

## 2019-03-30 DIAGNOSIS — E785 Hyperlipidemia, unspecified: Secondary | ICD-10-CM | POA: Diagnosis present

## 2019-03-30 DIAGNOSIS — K921 Melena: Secondary | ICD-10-CM | POA: Diagnosis present

## 2019-03-30 DIAGNOSIS — Z86711 Personal history of pulmonary embolism: Secondary | ICD-10-CM

## 2019-03-30 DIAGNOSIS — D649 Anemia, unspecified: Secondary | ICD-10-CM | POA: Insufficient documentation

## 2019-03-30 DIAGNOSIS — G453 Amaurosis fugax: Secondary | ICD-10-CM | POA: Diagnosis present

## 2019-03-30 DIAGNOSIS — Z7901 Long term (current) use of anticoagulants: Secondary | ICD-10-CM

## 2019-03-30 DIAGNOSIS — N1831 Chronic kidney disease, stage 3a: Secondary | ICD-10-CM | POA: Diagnosis not present

## 2019-03-30 DIAGNOSIS — D62 Acute posthemorrhagic anemia: Secondary | ICD-10-CM | POA: Diagnosis present

## 2019-03-30 DIAGNOSIS — K621 Rectal polyp: Secondary | ICD-10-CM | POA: Diagnosis present

## 2019-03-30 DIAGNOSIS — K635 Polyp of colon: Secondary | ICD-10-CM | POA: Diagnosis present

## 2019-03-30 DIAGNOSIS — N183 Chronic kidney disease, stage 3 unspecified: Secondary | ICD-10-CM | POA: Diagnosis present

## 2019-03-30 DIAGNOSIS — Z79899 Other long term (current) drug therapy: Secondary | ICD-10-CM

## 2019-03-30 DIAGNOSIS — I10 Essential (primary) hypertension: Secondary | ICD-10-CM | POA: Diagnosis not present

## 2019-03-30 DIAGNOSIS — K862 Cyst of pancreas: Secondary | ICD-10-CM | POA: Diagnosis present

## 2019-03-30 DIAGNOSIS — Z8249 Family history of ischemic heart disease and other diseases of the circulatory system: Secondary | ICD-10-CM | POA: Diagnosis not present

## 2019-03-30 DIAGNOSIS — Z20828 Contact with and (suspected) exposure to other viral communicable diseases: Secondary | ICD-10-CM | POA: Diagnosis present

## 2019-03-30 DIAGNOSIS — K254 Chronic or unspecified gastric ulcer with hemorrhage: Secondary | ICD-10-CM | POA: Diagnosis present

## 2019-03-30 DIAGNOSIS — Z7951 Long term (current) use of inhaled steroids: Secondary | ICD-10-CM | POA: Diagnosis not present

## 2019-03-30 DIAGNOSIS — I2699 Other pulmonary embolism without acute cor pulmonale: Secondary | ICD-10-CM | POA: Diagnosis not present

## 2019-03-30 DIAGNOSIS — I5032 Chronic diastolic (congestive) heart failure: Secondary | ICD-10-CM | POA: Diagnosis present

## 2019-03-30 DIAGNOSIS — K648 Other hemorrhoids: Secondary | ICD-10-CM | POA: Diagnosis present

## 2019-03-30 DIAGNOSIS — E782 Mixed hyperlipidemia: Secondary | ICD-10-CM | POA: Diagnosis present

## 2019-03-30 DIAGNOSIS — R195 Other fecal abnormalities: Secondary | ICD-10-CM | POA: Diagnosis present

## 2019-03-30 DIAGNOSIS — K922 Gastrointestinal hemorrhage, unspecified: Secondary | ICD-10-CM | POA: Diagnosis present

## 2019-03-30 DIAGNOSIS — D500 Iron deficiency anemia secondary to blood loss (chronic): Secondary | ICD-10-CM | POA: Diagnosis not present

## 2019-03-30 DIAGNOSIS — K2970 Gastritis, unspecified, without bleeding: Secondary | ICD-10-CM | POA: Diagnosis present

## 2019-03-30 DIAGNOSIS — Z8719 Personal history of other diseases of the digestive system: Secondary | ICD-10-CM | POA: Diagnosis not present

## 2019-03-30 DIAGNOSIS — D509 Iron deficiency anemia, unspecified: Secondary | ICD-10-CM

## 2019-03-30 DIAGNOSIS — I130 Hypertensive heart and chronic kidney disease with heart failure and stage 1 through stage 4 chronic kidney disease, or unspecified chronic kidney disease: Secondary | ICD-10-CM | POA: Diagnosis present

## 2019-03-30 LAB — CBC
HCT: 18.1 % — ABNORMAL LOW (ref 39.0–52.0)
Hemoglobin: 5.6 g/dL — CL (ref 13.0–17.0)
MCH: 27.6 pg (ref 26.0–34.0)
MCHC: 30.9 g/dL (ref 30.0–36.0)
MCV: 89.2 fL (ref 80.0–100.0)
Platelets: 277 10*3/uL (ref 150–400)
RBC: 2.03 MIL/uL — ABNORMAL LOW (ref 4.22–5.81)
RDW: 16.8 % — ABNORMAL HIGH (ref 11.5–15.5)
WBC: 6.9 10*3/uL (ref 4.0–10.5)
nRBC: 0 % (ref 0.0–0.2)

## 2019-03-30 LAB — COMPREHENSIVE METABOLIC PANEL
ALT: 19 U/L (ref 0–44)
AST: 16 U/L (ref 15–41)
Albumin: 3.5 g/dL (ref 3.5–5.0)
Alkaline Phosphatase: 49 U/L (ref 38–126)
Anion gap: 9 (ref 5–15)
BUN: 19 mg/dL (ref 8–23)
CO2: 23 mmol/L (ref 22–32)
Calcium: 8.7 mg/dL — ABNORMAL LOW (ref 8.9–10.3)
Chloride: 108 mmol/L (ref 98–111)
Creatinine, Ser: 1.9 mg/dL — ABNORMAL HIGH (ref 0.61–1.24)
GFR calc Af Amer: 37 mL/min — ABNORMAL LOW (ref >60)
GFR calc non Af Amer: 32 mL/min — ABNORMAL LOW (ref >60)
Glucose, Bld: 136 mg/dL — ABNORMAL HIGH (ref 70–99)
Potassium: 4.5 mmol/L (ref 3.5–5.1)
Sodium: 140 mmol/L (ref 135–145)
Total Bilirubin: 0.7 mg/dL (ref 0.3–1.2)
Total Protein: 5.9 g/dL — ABNORMAL LOW (ref 6.5–8.1)

## 2019-03-30 LAB — ABO/RH: ABO/RH(D): O POS

## 2019-03-30 LAB — POC OCCULT BLOOD, ED: Fecal Occult Bld: POSITIVE — AB

## 2019-03-30 LAB — PREPARE RBC (CROSSMATCH)

## 2019-03-30 LAB — SARS CORONAVIRUS 2 (TAT 6-24 HRS): SARS Coronavirus 2: NEGATIVE

## 2019-03-30 LAB — PROTIME-INR
INR: 2.7 — ABNORMAL HIGH (ref 0.8–1.2)
Prothrombin Time: 28 seconds — ABNORMAL HIGH (ref 11.4–15.2)

## 2019-03-30 MED ORDER — HYDRALAZINE HCL 25 MG PO TABS: 25.0000 mg | ORAL_TABLET | Freq: Three times a day (TID) | ORAL | Status: DC | PRN

## 2019-03-30 MED ORDER — LORATADINE 10 MG PO TABS: 10.0000 mg | ORAL_TABLET | Freq: Every evening | ORAL | Status: DC | PRN

## 2019-03-30 MED ORDER — SENNA 8.6 MG PO TABS: 1.0000 | ORAL_TABLET | Freq: Every day | ORAL | Status: DC | PRN

## 2019-03-30 MED ORDER — FERROUS SULFATE 325 (65 FE) MG PO TABS
325.0000 mg | ORAL_TABLET | Freq: Every day | ORAL | Status: DC
  Administered 2019-03-31: 325 mg via ORAL
  Filled 2019-03-30: qty 1

## 2019-03-30 MED ORDER — AMLODIPINE BESYLATE 5 MG PO TABS
5.0000 mg | ORAL_TABLET | Freq: Every day | ORAL | Status: DC
  Administered 2019-03-31: 5 mg via ORAL
  Filled 2019-03-30: qty 1

## 2019-03-30 MED ORDER — FLUTICASONE PROPIONATE 50 MCG/ACT NA SUSP: 2.0000 | Freq: Every day | NASAL | Status: DC | PRN

## 2019-03-30 MED ORDER — ACETAMINOPHEN 160 MG/5ML PO SOLN: 240.0000 mg | Freq: Four times a day (QID) | ORAL | Status: DC | PRN

## 2019-03-30 MED ORDER — ADULT MULTIVITAMIN W/MINERALS CH
1.0000 | ORAL_TABLET | Freq: Every day | ORAL | Status: DC
  Administered 2019-03-31: 1 via ORAL
  Filled 2019-03-30: qty 1

## 2019-03-30 MED ORDER — ALLOPURINOL 100 MG PO TABS
100.0000 mg | ORAL_TABLET | Freq: Every day | ORAL | Status: DC
  Administered 2019-03-31: 100 mg via ORAL
  Filled 2019-03-30: qty 1

## 2019-03-30 MED ORDER — ONDANSETRON HCL 4 MG/2ML IJ SOLN: 4.0000 mg | Freq: Four times a day (QID) | INTRAMUSCULAR | Status: DC | PRN

## 2019-03-30 MED ORDER — SODIUM CHLORIDE 0.9 % IV SOLN
10.0000 mL/h | Freq: Once | INTRAVENOUS | Status: AC
  Administered 2019-03-30: 10 mL/h via INTRAVENOUS

## 2019-03-30 MED ORDER — ONDANSETRON HCL 4 MG PO TABS: 4.0000 mg | ORAL_TABLET | Freq: Four times a day (QID) | ORAL | Status: DC | PRN

## 2019-03-30 MED ORDER — PANTOPRAZOLE SODIUM 40 MG PO TBEC
40.0000 mg | DELAYED_RELEASE_TABLET | Freq: Two times a day (BID) | ORAL | Status: DC
  Administered 2019-03-31 (×2): 40 mg via ORAL
  Filled 2019-03-30 (×2): qty 1

## 2019-03-30 MED ORDER — ALBUTEROL SULFATE HFA 108 (90 BASE) MCG/ACT IN AERS
1.0000 | INHALATION_SPRAY | Freq: Four times a day (QID) | RESPIRATORY_TRACT | Status: DC | PRN
  Filled 2019-03-30: qty 6.7

## 2019-03-30 MED ORDER — PRAVASTATIN SODIUM 40 MG PO TABS
80.0000 mg | ORAL_TABLET | Freq: Every evening | ORAL | Status: DC
  Administered 2019-03-31: 80 mg via ORAL
  Filled 2019-03-30: qty 2

## 2019-03-30 NOTE — ED Notes (Addendum)
HGB 5.6 per lab.  Dr. Sedonia Small notified.  Pt will be next to go to treatment room.

## 2019-03-30 NOTE — H&P (Addendum)
History and Physical    Stephen ReidJoe L Speyer ZOX:[id number] DOB: [birth year] DOA: 03/30/2019  Referring MD/NP/PA:   PCP: Catha GosselinLittle, Kevin, MD   Patient coming from:  The patient is coming from home.  At baseline, pt is independent for most of ADL.        Chief Complaint: dark stool and fatigue  HPI: Stephen Chavez is a 85 y.o. male with medical history significant of dCHF, HTN, HLD, PE on xarelto, CKD-III, anemia, carotid artery stenosis, right eye amaurosis fugax, who presents with dark stool, fatigue.  Pt was diagnosed with PE back in January and is on Xarelto currently. Pt states that he has been having intermittent dark stool which he thought 80s is due to iron supplement use.  He developed generalized weakness and fatigue recently.  Denies lightheadedness, dizziness, shortness breath or chest pain. He follows with Dr. Ewing SchleinMagod for anemia and had some blood work done that showed his hemoglobin was very low.  He was asked to come to the emergency department to be admitted.  Patient does not have cough, fever or chills.  Denies nausea vomiting, diarrhea or abdominal pain.  No symptoms of UTI or unilateral weakness.his last dose of Xarelto was yesterday evening.  ED Course: pt was found to have positive FOBT, hemoglobin 5.6 (9.1 on 06/29/2018), INR 2.7, pending COVID-19 test, renal function close to baseline, temperature normal, blood pressure 116/48, heart rate 66, oxygen saturation normal.  Patient is admitted to telemetry bed as inpatient.  Review of Systems:   General: no fevers, chills, no body weight gain, has fatigue HEENT: no blurry vision, hearing changes or sore throat Respiratory: no dyspnea, coughing, wheezing CV: no chest pain, no palpitations GI: no nausea, vomiting, abdominal pain, diarrhea, constipation. Has dark stool. GU: no dysuria, burning on urination, increased urinary frequency, hematuria  Ext: no leg edema Neuro: no unilateral weakness, numbness, or tingling, no vision change or  hearing loss Skin: no rash, no skin tear. MSK: No muscle spasm, no deformity, no limitation of range of movement in spin Heme: No easy bruising.  Travel history: No recent long distant travel.  Allergy: No Known Allergies  Past Medical History:  Diagnosis Date  . Amaurosis fugax of right eye 09/03/2016  . Carotid stenosis 09/03/2016   Mild 05/2016  . Chronic combined systolic and diastolic heart failure (HCC) 10/23/2016  . Essential hypertension 09/03/2016  . Hyperlipidemia   . Hypertension     Past Surgical History:  Procedure Laterality Date  . EYE SURGERY    . polyp removal      Social History:  reports that he has never smoked. He has never used smokeless tobacco. He reports current alcohol use of about 4.0 standard drinks of alcohol per week. He reports that he does not use drugs.  Family History:  Family History  Problem Relation Age of Onset  . Hypertension Mother      Prior to Admission medications   Medication Sig Start Date End Date Taking? Authorizing Provider  allopurinol (ZYLOPRIM) 100 MG tablet Take 100 mg by mouth daily.   Yes [provider]  amLODipine (NORVASC) 5 MG tablet Take 1.5 tablets daily Patient taking differently: Take 5 mg by mouth at bedtime.  07/26/18  Yes Chilton Siandolph, Tiffany, MD  ferrous sulfate 325 (65 FE) MG tablet Take 325 mg by mouth daily with breakfast.   Yes [provider]  pravastatin (PRAVACHOL) 80 MG tablet Take 80 mg by mouth every evening.    Yes [provider]  rivaroxaban (XARELTO) 20 MG TABS tablet Take 20 mg by mouth daily with supper.   Yes [provider]  senna (SENOKOT) 8.6 MG TABS tablet Take 1 tablet by mouth daily.   Yes [provider]  acetaminophen (TYLENOL) 325 MG tablet Take 2 tablets (650 mg total) by mouth every 6 (six) hours as needed for mild pain (or Fever >/= 101). 06/29/18   Geradine Girt, DO  albuterol (PROVENTIL HFA;VENTOLIN HFA) 108 (90 Base) MCG/ACT inhaler Inhale  1-2 puffs into the lungs every 6 (six) hours as needed for wheezing or shortness of breath.    [provider]  ELIQUIS STARTER PACK (ELIQUIS STARTER PACK) 5 MG TABS Take as directed on package: start with two-5mg  tablets twice daily for 7 days. On day 8, switch to one-5mg  tablet twice daily. Patient not taking: Reported on 03/30/2019 06/29/18   Geradine Girt, DO  fluticasone [facility]) 50 MCG/ACT nasal spray Place 2 sprays into both nostrils daily as needed for allergies or rhinitis.    [provider]  loratadine (CLARITIN) 10 MG tablet Take 10 mg by mouth at bedtime.    [provider]  oxyCODONE-acetaminophen (PERCOCET/ROXICET) 5-325 MG tablet Take 1 tablet by mouth every 4 (four) hours as needed for moderate pain. 06/29/18   Geradine Girt, DO  psyllium (METAMUCIL) 58.6 % powder Take 1 packet by mouth at bedtime.    [provider]    Physical Exam: Vitals:   03/30/19 1926 03/30/19 1930 03/30/19 1945 03/30/19 2000  BP:  126/61 (!) 116/48 131/63  Pulse:      Resp:  15 15 14   Temp: 97.9 F (36.6 C)     TempSrc: Oral     SpO2:       General: Not in acute distress. Pale looking. HEENT:       Eyes: PERRL, EOMI, no scleral icterus.       ENT: No discharge from the ears and nose, no pharynx injection, no tonsillar enlargement.        Neck: No JVD, no bruit, no mass felt. Heme: No neck lymph node enlargement. Cardiac: S1/S2, RRR, No murmurs, No gallops or rubs. Respiratory: No rales, wheezing, rhonchi or rubs. GI: Soft, nondistended, nontender, no rebound pain, no organomegaly, BS present. GU: No hematuria Ext: No pitting leg edema bilaterally. 2+DP/PT pulse bilaterally. Musculoskeletal: No joint deformities, No joint redness or warmth, no limitation of ROM in spin. Skin: No rashes.  Neuro: Alert, oriented X3, cranial nerves II-XII grossly intact, moves all extremities normally Psych: Patient is not psychotic, no suicidal or hemocidal ideation.   Labs on Admission: I have personally reviewed following labs and imaging studies  CBC: Recent Labs  Lab 03/30/19 1704  WBC 6.9  HGB 5.6*  HCT 18.1*  MCV 89.2  PLT 102   Basic Metabolic Panel: Recent Labs  Lab 03/30/19 1704  NA 140  K 4.5  CL 108  CO2 23  GLUCOSE 136*  BUN 19  CREATININE 1.90*  CALCIUM 8.7*   GFR: CrCl cannot be calculated (Unknown ideal weight.). Liver Function Tests: Recent Labs  Lab 03/30/19 1704  AST 16  ALT 19  ALKPHOS 49  BILITOT 0.7  PROT 5.9*  ALBUMIN 3.5   No results for input(s): LIPASE, AMYLASE in the last 168 hours. No results for input(s): AMMONIA in the last 168 hours. Coagulation Profile: Recent Labs  Lab 03/30/19 1930  INR 2.7*   Cardiac Enzymes: No results for input(s): CKTOTAL, CKMB,  CKMBINDEX, TROPONINI in the last 168 hours. BNP (last 3 results) No results for input(s): PROBNP in the last 8760 hours. HbA1C: No results for input(s): HGBA1C in the last 72 hours. CBG: No results for input(s): GLUCAP in the last 168 hours. Lipid Profile: No results for input(s): CHOL, HDL, LDLCALC, TRIG, CHOLHDL, LDLDIRECT in the last 72 hours. Thyroid Function Tests: No results for input(s): TSH, T4TOTAL, FREET4, T3FREE, THYROIDAB in the last 72 hours. Anemia Panel: No results for input(s): VITAMINB12, FOLATE, FERRITIN, TIBC, IRON, RETICCTPCT in the last 72 hours. Urine analysis:    Component Value Date/Time   COLORURINE YELLOW 06/27/2018 2335   APPEARANCEUR CLEAR 06/27/2018 2335   LABSPEC 1.012 06/27/2018 2335   PHURINE 6.0 06/27/2018 2335   GLUCOSEU NEGATIVE 06/27/2018 2335   HGBUR NEGATIVE 06/27/2018 2335   BILIRUBINUR NEGATIVE 06/27/2018 2335   KETONESUR NEGATIVE 06/27/2018 2335   PROTEINUR NEGATIVE 06/27/2018 2335   NITRITE NEGATIVE 06/27/2018 2335   LEUKOCYTESUR NEGATIVE 06/27/2018 2335   Sepsis Labs: (procalcitonin:4,lacticidven:4) )No results found for this or any previous visit (from the past 240  hour(s)).   Radiological Exams on Admission: No results found.   EKG: Independently reviewed.  Sinus rhythm, QTC 436, low voltage, LAD, nonspecific TW change.  Assessment/Plan Principal Problem:   GIB (gastrointestinal bleeding) Active Problems:   Essential hypertension   Chronic diastolic CHF (congestive heart failure) (HCC)   PE (pulmonary thromboembolism) (HCC)   HLD (hyperlipidemia)   CKD (chronic kidney disease), stage III   Anemia due to blood loss   GIB (gastrointestinal bleeding) and anemia due to blood loss: Patient has symptomatic anemia, likely due to slow blood loss from GI. FOBT positive. Hgb 9.1 -->5.6.  Currently hemodynamically stable.  I cannot find record for EGD or colonoscopy in the past in epic.  Dr. Ewing Schlein of [facility] was consulted.  - will admit to tele bed as inpt - transfuse 2 units of blood now - NPO after MN  - Start pantoprazole oral 40 mg bid - Zofran IV for nausea - Avoid NSAIDs and SQ heparin - Maintain IV access (2 large bore IVs if possible). - Monitor closely and follow q6h cbc, transfuse as necessary, if Hgb<7.0 - LaB: INR, PTT and type screen - continue iron supplement and check anemia panel - f/u GI recommendations  Essential hypertension: -prn hydralazine orally -Amlodipine  PE (pulmonary thromboembolism) (HCC): -hold Xarelto now  HLD (hyperlipidemia): -pravastatin  CKD (chronic kidney disease), stage III: baseline Cre 1.6-1.8. his cre 1.90 and BUN 19, close to baseline -f/u BMP  Chronic diastolic congestive heart failure: 2D echo 06/28/2018 showed EF 60-65% with grade 1 diastolic dysfunction.  Patient does not have leg edema or JVD.  CHF is compensated at this moment. -Check a BMP   Inpatient status:  # Patient requires inpatient status due to high intensity of service, high risk for further deterioration and high frequency of surveillance required.  I certify that at the point of admission it is my clinical judgment that the  patient will require inpatient hospital care spanning beyond 2 midnights from the point of admission.  . This patient has multiple chronic comorbidities including dCHF, HTN, HLD, PE on xarelto, CKD-III, anemia, carotid artery stenosis, right eye amaurosis fugax. . Now patient has presenting with symptomatic anemia due to GI bleeding, . The worrisome physical exam findings include pale looking . The initial radiographic and laboratory data are worrisome because of hemoglobin 5.6, positive FOBT, INR 2.7 . Current medical needs: please see my  assessment and plan . Predictability of an adverse outcome (risk): Patient's multiple comorbidities, including PE on blood thinner Xarelto, now presents with symptomatic anemia due to GI bleeding.  The anemia is severe with hemoglobin 5.6.  Patient will need GI evaluation and possible endoscopy.  Given his old age and severe anemia and also on blood thinner, patient is at high risk for deteriorating.  Patient will need to be treated in hospital for at least 2 days.            DVT ppx: SCD Code Status: Full code Family Communication: None at bed side.     Disposition Plan:  Anticipate discharge back to previous home environment Consults called:  Dr. Ewing Schlein of [facility] Admission status:  Inpatient/tele    Date of Service 03/30/2019    Lorretta Harp [facility]   If 7PM-7AM, please contact night-coverage [email or web address] Password [email or web address] 03/30/2019, 9:12 PM

## 2019-03-30 NOTE — ED Triage Notes (Signed)
Pt reports being called by Dr. Perley Jain office for low hgb in the 5 range. He reports feeling fatigue, otherwise no chest pain or shortness of breath. Recently had an EGD and iron infusions.

## 2019-03-30 NOTE — ED Notes (Signed)
Called for hospital bed so pt may be more comfortable 

## 2019-03-30 NOTE — ED Provider Notes (Signed)
[facility] EMERGENCY DEPARTMENT Provider Note   CSN: [id number] Arrival date & time: 03/30/19  1645     History   Chief Complaint Chief Complaint  Patient presents with  . Abnormal Lab    HPI KILIAN SCHWARTZ is a 85 y.o. male.  He has a history of hypertension and CHF, had a PE back in January and is on anticoagulation.  He follows with Dr. Ewing Schlein for anemia and had some blood work done that showed his hemoglobin was very low.  He was asked to come to the emergency department to be admitted.  He says he has been generally fatigued since January although may be a little bit more short of breath over the past few weeks.  He has not noticed any bleeding but he says he takes iron supplements and that turns his stools dark.     The history is provided by the patient.  Abnormal Lab Patient referred by:  Specialist Result type: hematology   Hematology:    Hematocrit:  Low   Hemoglobin:  Low Weakness Severity:  Moderate Onset quality:  Gradual Timing:  Intermittent Progression:  Worsening Relieved by:  Rest Worsened by:  Activity Ineffective treatments:  None tried Associated symptoms: dizziness, melena (?) and shortness of breath   Associated symptoms: no abdominal pain, no chest pain, no cough, no diarrhea, no dysuria, no fever, no nausea, no syncope and no vomiting     Past Medical History:  Diagnosis Date  . Amaurosis fugax of right eye 09/03/2016  . Carotid stenosis 09/03/2016   Mild 05/2016  . Chronic combined systolic and diastolic heart failure (HCC) 10/23/2016  . Essential hypertension 09/03/2016  . Hyperlipidemia   . Hypertension     Patient Active Problem List   Diagnosis Date Noted  . PE (pulmonary thromboembolism) (HCC) 06/28/2018  . HLD (hyperlipidemia) 06/28/2018  . AKI (acute kidney injury) (HCC) 06/28/2018  . Leukocytosis 06/28/2018  . Generalized weakness   . Left foot pain 07/15/2017  . Chronic combined systolic and diastolic heart  failure (HCC) 47/65/4650  . Carotid stenosis 09/03/2016  . Essential hypertension 09/03/2016  . Amaurosis fugax of right eye 06/05/2016    Past Surgical History:  Procedure Laterality Date  . EYE SURGERY    . polyp removal          Home Medications    Prior to Admission medications   Medication Sig Start Date End Date Taking? Authorizing Provider  acetaminophen (TYLENOL) 325 MG tablet Take 2 tablets (650 mg total) by mouth every 6 (six) hours as needed for mild pain (or Fever >/= 101). 06/29/18   Joseph Art, DO  albuterol (PROVENTIL HFA;VENTOLIN HFA) 108 (90 Base) MCG/ACT inhaler Inhale 1-2 puffs into the lungs every 6 (six) hours as needed for wheezing or shortness of breath.    [provider]  allopurinol (ZYLOPRIM) 300 MG tablet Take 300 mg by mouth every morning.    [provider]  amLODipine (NORVASC) 5 MG tablet Take 1.5 tablets daily 07/26/18   Chilton Si, MD  ELIQUIS STARTER PACK [facility] STARTER PACK) 5 MG TABS Take as directed on package: start with two-5mg  tablets twice daily for 7 days. On day 8, switch to one-5mg  tablet twice daily. 06/29/18   Joseph Art, DO  fluticasone (FLONASE) 50 MCG/ACT nasal spray Place 2 sprays into both nostrils daily as needed for allergies or rhinitis.    [provider]  loratadine (CLARITIN) 10 MG tablet Take 10 mg  by mouth at bedtime.    [provider]  oxyCODONE-acetaminophen (PERCOCET/ROXICET) 5-325 MG tablet Take 1 tablet by mouth every 4 (four) hours as needed for moderate pain. 06/29/18   Geradine Girt, DO  pravastatin (PRAVACHOL) 80 MG tablet Take 80 mg by mouth at bedtime.     [provider]  psyllium (METAMUCIL) 58.6 % powder Take 1 packet by mouth at bedtime.    [provider]    Family History Family History  Problem Relation Age of Onset  . Hypertension Mother     Social History Social History   Tobacco Use  . Smoking status: Never Smoker  .  Smokeless tobacco: Never Used  Substance Use Topics  . Alcohol use: Yes    Alcohol/week: 4.0 standard drinks    Types: 4 Standard drinks or equivalent per week  . Drug use: No     Allergies   Patient has no known allergies.   Review of Systems Review of Systems  Constitutional: Negative for fever.  HENT: Negative for sore throat.   Eyes: Negative for visual disturbance.  Respiratory: Positive for shortness of breath. Negative for cough.   Cardiovascular: Negative for chest pain and syncope.  Gastrointestinal: Positive for melena (?). Negative for abdominal pain, diarrhea, nausea and vomiting.  Genitourinary: Negative for dysuria.  Musculoskeletal: Negative for neck pain.  Skin: Negative for rash.  Neurological: Positive for dizziness and weakness.     Physical Exam Updated Vital Signs BP (!) 143/57 (BP Location: Right Arm)   Pulse 89   Temp 98 F (36.7 C) (Oral)   SpO2 98%   Physical Exam Vitals signs and nursing note reviewed. Exam conducted with a chaperone present.  Constitutional:      Appearance: He is well-developed.  HENT:     Head: Normocephalic and atraumatic.  Eyes:     Conjunctiva/sclera: Conjunctivae normal.  Neck:     Musculoskeletal: Neck supple.  Cardiovascular:     Rate and Rhythm: Normal rate and regular rhythm.     Heart sounds: No murmur.  Pulmonary:     Effort: Pulmonary effort is normal. No respiratory distress.     Breath sounds: Normal breath sounds.  Abdominal:     Palpations: Abdomen is soft.     Tenderness: There is no abdominal tenderness.  Genitourinary:    Rectum: Normal.  Musculoskeletal: Normal range of motion.     Right lower leg: No edema.     Left lower leg: No edema.  Skin:    General: Skin is warm and dry.     Capillary Refill: Capillary refill takes less than 2 seconds.  Neurological:     General: No focal deficit present.     Mental Status: He is alert and oriented to person, place, and time.     Sensory: No  sensory deficit.     Motor: No weakness.      ED Treatments / Results  Labs (all labs ordered are listed, but only abnormal results are displayed) Labs Reviewed  COMPREHENSIVE METABOLIC PANEL - Abnormal; Notable for the following components:      Result Value   Glucose, Bld 136 (*)    Creatinine, Ser 1.90 (*)    Calcium 8.7 (*)    Total Protein 5.9 (*)    GFR calc non Af Amer 32 (*)    GFR calc Af Amer 37 (*)    All other components within normal limits  CBC - Abnormal; Notable for the following components:  RBC 2.03 (*)    Hemoglobin 5.6 (*)    HCT 18.1 (*)    RDW 16.8 (*)    All other components within normal limits  PROTIME-INR - Abnormal; Notable for the following components:   Prothrombin Time 28.0 (*)    INR 2.7 (*)    All other components within normal limits  POC OCCULT BLOOD, ED - Abnormal; Notable for the following components:   Fecal Occult Bld POSITIVE (*)    All other components within normal limits  SARS CORONAVIRUS 2 (TAT 6-24 HRS)  VITAMIN B12  FOLATE  IRON AND TIBC  FERRITIN  RETICULOCYTES  APTT  BRAIN NATRIURETIC PEPTIDE  CBC  CBC  CBC  BASIC METABOLIC PANEL  CBC  TYPE AND SCREEN  ABO/RH  PREPARE RBC (CROSSMATCH)    EKG EKG Interpretation  Date/Time:  Wednesday March 30 2019 19:01:52 EDT Ventricular Rate:  69 PR Interval:    QRS Duration: 96 QT Interval:  407 QTC Calculation: 436 R Axis:   -26 Text Interpretation:  Sinus rhythm Borderline left axis deviation Borderline low voltage, extremity leads Abnormal R-wave progression, early transition similar to prior 1/20 Confirmed by Meridee Score [phone]) on 03/30/2019 7:03:27 PM   Radiology No results found.  Procedures .Critical Care Performed by: Terrilee Files, MD Authorized by: Terrilee Files, MD   Critical care provider statement:    Critical care time (minutes):  45   Critical care time was exclusive of:  Separately billable procedures and treating other patients    Critical care was necessary to treat or prevent imminent or life-threatening deterioration of the following conditions:  Circulatory failure   Critical care was time spent personally by me on the following activities:  Discussions with consultants, evaluation of patient's response to treatment, examination of patient, ordering and performing treatments and interventions, ordering and review of laboratory studies, ordering and review of radiographic studies, pulse oximetry, re-evaluation of patient's condition, obtaining history from patient or surrogate, review of old charts and development of treatment plan with patient or surrogate   I assumed direction of critical care for this patient from another provider in my specialty: no     (including critical care time)  Medications Ordered in ED Medications  0.9 %  sodium chloride infusion (has no administration in time range)     Initial Impression / Assessment and Plan / ED Course  I have reviewed the triage vital signs and the nursing notes.  Pertinent labs & imaging results that were available during my care of the patient were reviewed by me and considered in my medical decision making (see chart for details).  Clinical Course as of Mar 29 2350  Wed Mar 30, 2019  2000 Patient here with anemia from labs done outpatient GI.  Prior history of likely GI bleed although no source has been identified.  Complaining of some fatigue shortness of breath.  Repeat labs here show a low hemoglobin.  Creatinine is also slightly more elevated.  Differential includes upper GI bleed lower GI bleed symptomatic anemia.   [MB]  2000 Discussed with Dr. Ewing Schlein from [facility].  He agrees with current management of transfuse and admit and Deboraha Sprang will consult on the patient.  He is recommending clear liquid diets for now.   [MB]  2016 D/w Dr Clyde Lundborg [facility] who will see for admission.    [MB]    Clinical Course User Index [MB] Terrilee Files, MD   Lorrene Reid was evaluated  in Emergency Department on 03/30/2019 for the symptoms described in the history of present illness. He was evaluated in the context of the global COVID-19 pandemic, which necessitated consideration that the patient might be at risk for infection with the SARS-CoV-2 virus that causes COVID-19. Institutional protocols and algorithms that pertain to the evaluation of patients at risk for COVID-19 are in a state of rapid change based on information released by regulatory bodies including the CDC and federal and state organizations. These policies and algorithms were followed during the patient's care in the ED.       Final Clinical Impressions(s) / ED Diagnoses   Final diagnoses:  Symptomatic anemia  Guaiac + stool    ED Discharge Orders    None       Terrilee FilesButler, Wendy Hoback C, MD 03/30/19 2352

## 2019-03-31 ENCOUNTER — Other Ambulatory Visit: Payer: Self-pay

## 2019-03-31 DIAGNOSIS — I5032 Chronic diastolic (congestive) heart failure: Secondary | ICD-10-CM

## 2019-03-31 DIAGNOSIS — R195 Other fecal abnormalities: Secondary | ICD-10-CM

## 2019-03-31 LAB — BPAM RBC
Blood Product Expiration Date: 202011182359
Blood Product Expiration Date: 202011182359
ISSUE DATE / TIME: 202010141939
ISSUE DATE / TIME: 202010142350
Unit Type and Rh: 5100
Unit Type and Rh: 5100

## 2019-03-31 LAB — BASIC METABOLIC PANEL
Anion gap: 6 (ref 5–15)
BUN: 18 mg/dL (ref 8–23)
CO2: 23 mmol/L (ref 22–32)
Calcium: 9.1 mg/dL (ref 8.9–10.3)
Chloride: 113 mmol/L — ABNORMAL HIGH (ref 98–111)
Creatinine, Ser: 1.62 mg/dL — ABNORMAL HIGH (ref 0.61–1.24)
GFR calc Af Amer: 45 mL/min — ABNORMAL LOW (ref >60)
GFR calc non Af Amer: 38 mL/min — ABNORMAL LOW (ref >60)
Glucose, Bld: 91 mg/dL (ref 70–99)
Potassium: 4.7 mmol/L (ref 3.5–5.1)
Sodium: 142 mmol/L (ref 135–145)

## 2019-03-31 LAB — CBC
HCT: 25.2 % — ABNORMAL LOW (ref 39.0–52.0)
HCT: 25.3 % — ABNORMAL LOW (ref 39.0–52.0)
HCT: 27.7 % — ABNORMAL LOW (ref 39.0–52.0)
Hemoglobin: 8 g/dL — ABNORMAL LOW (ref 13.0–17.0)
Hemoglobin: 8.1 g/dL — ABNORMAL LOW (ref 13.0–17.0)
Hemoglobin: 9 g/dL — ABNORMAL LOW (ref 13.0–17.0)
MCH: 28.3 pg (ref 26.0–34.0)
MCH: 28.4 pg (ref 26.0–34.0)
MCH: 28.2 pg (ref 26.0–34.0)
MCHC: 31.7 g/dL (ref 30.0–36.0)
MCHC: 32 g/dL (ref 30.0–36.0)
MCHC: 32.5 g/dL (ref 30.0–36.0)
MCV: 88.5 fL (ref 80.0–100.0)
MCV: 89.4 fL (ref 80.0–100.0)
MCV: 86.8 fL (ref 80.0–100.0)
Platelets: 254 10*3/uL (ref 150–400)
Platelets: 263 10*3/uL (ref 150–400)
Platelets: 284 10*3/uL (ref 150–400)
RBC: 2.82 MIL/uL — ABNORMAL LOW (ref 4.22–5.81)
RBC: 2.86 MIL/uL — ABNORMAL LOW (ref 4.22–5.81)
RBC: 3.19 MIL/uL — ABNORMAL LOW (ref 4.22–5.81)
RDW: 15.3 % (ref 11.5–15.5)
RDW: 15.5 % (ref 11.5–15.5)
RDW: 15.5 % (ref 11.5–15.5)
WBC: 6.6 10*3/uL (ref 4.0–10.5)
WBC: 6.6 10*3/uL (ref 4.0–10.5)
WBC: 7 10*3/uL (ref 4.0–10.5)
nRBC: 0 % (ref 0.0–0.2)
nRBC: 0 % (ref 0.0–0.2)
nRBC: 0 % (ref 0.0–0.2)

## 2019-03-31 LAB — TYPE AND SCREEN
ABO/RH(D): O POS
Antibody Screen: NEGATIVE
Unit division: 0
Unit division: 0

## 2019-03-31 LAB — RETICULOCYTES
Immature Retic Fract: 39.5 % — ABNORMAL HIGH (ref 2.3–15.9)
RBC.: 2.87 MIL/uL — ABNORMAL LOW (ref 4.22–5.81)
Retic Count, Absolute: 157 10*3/uL (ref 19.0–186.0)
Retic Ct Pct: 5.5 % — ABNORMAL HIGH (ref 0.4–3.1)

## 2019-03-31 LAB — IRON AND TIBC
Iron: 21 ug/dL — ABNORMAL LOW (ref 45–182)
Saturation Ratios: 6 % — ABNORMAL LOW (ref 17.9–39.5)
TIBC: 372 ug/dL (ref 250–450)
UIBC: 351 ug/dL

## 2019-03-31 LAB — CBG MONITORING, ED: Glucose-Capillary: 85 mg/dL (ref 70–99)

## 2019-03-31 LAB — FOLATE: Folate: 21.3 ng/mL (ref >5.9)

## 2019-03-31 LAB — VITAMIN B12: Vitamin B-12: 517 pg/mL (ref 180–914)

## 2019-03-31 LAB — BRAIN NATRIURETIC PEPTIDE: B Natriuretic Peptide: 38 pg/mL (ref 0.0–100.0)

## 2019-03-31 LAB — FERRITIN: Ferritin: 8 ng/mL — ABNORMAL LOW (ref 24–336)

## 2019-03-31 LAB — APTT: aPTT: 36 seconds (ref 24–36)

## 2019-03-31 MED ORDER — SODIUM CHLORIDE 0.9 % IV SOLN
510.0000 mg | Freq: Once | INTRAVENOUS | Status: AC
  Administered 2019-03-31: 510 mg via INTRAVENOUS
  Filled 2019-03-31: qty 17

## 2019-03-31 MED ORDER — HYDRALAZINE HCL 25 MG PO TABS: 25.0000 mg | ORAL_TABLET | Freq: Three times a day (TID) | ORAL | Status: DC | PRN

## 2019-03-31 MED ORDER — PEG 3350-KCL-NA BICARB-NACL 420 G PO SOLR
4000.0000 mL | Freq: Once | ORAL | Status: AC
  Administered 2019-03-31: 4000 mL via ORAL
  Filled 2019-03-31: qty 4000

## 2019-03-31 MED ORDER — SODIUM CHLORIDE 0.9 % IV SOLN
INTRAVENOUS | Status: DC
  Administered 2019-03-31: 07:00:00 via INTRAVENOUS

## 2019-03-31 NOTE — Progress Notes (Signed)
PROGRESS NOTE    Stephen ReidJoe L Tiberio   ZOX:[id number]  DOB: April 29, 1933  DOA: 03/30/2019 PCP: Catha GosselinLittle, Kevin, MD   Brief Narrative:  Stephen Chavez is an 85 year old male with PE in January on Xarelto, diastolic heart failure, hypertension, hyperlipidemia, chronic kidney disease stage III, carotid stenosis and amaurosis fugax. The patient presents because he was called from Dr. Marlane HatcherMagod's office for a low hemoglobin -In the ED hemoglobin 5.9 (9.1 on 06/29/2018), INR 2.7 He complains of generalized fatigue and weakness.   Subjective: He has no complaints today.     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 If he is bleeding from the AVM due to Xarelto -  I have spoken with his PCP just now, Dr Clarene DukeLittle, who feels that he can come off of the Xarelto if he does not have pancreatic cancer (which per Dr Ewing SchleinMagod, he doesn't)  Active Problems: PEs in Jan of 2010 - suspected to be provoked due to travel - he was on a cross country trip for many months - does not need Xarelto for this any longer    Anemia due to blood loss with Iron deficiency - receiving 2 U PRBC now - ferritin is 8-  Will give a dose of Feraheme - on oral Iron at home    Essential hypertension - on Norvasc 5 mg and PRN Hydralazine- BP now low from blood loss    Chronic diastolic CHF (congestive heart failure) - grade 1 per ECHO on 06/28/18 - not on diuretics as outpt- will follow respiratory status due to blood transfusions    HLD (hyperlipidemia) - Pravachol    CKD (chronic kidney disease), stage III - stable  Time spent in minutes: 35 DVT prophylaxis:  SCDs Code Status: Full code Family Communication:  Disposition Plan: f/u on GI eval Consultants:   GI Procedures:   none Antimicrobials:  Anti-infectives  (From admission, onward)   None       Objective: Vitals:   03/31/19 0025 03/31/19 0100 03/31/19 0219 03/31/19 0715  BP: (!) 141/87 (!) 153/69 (!) 148/76 123/69  Pulse: 66 82 66 76  Resp: 15 16 12  (!) 22  Temp: 98.4 F (36.9 C)  98.3 F (36.8 C)   TempSrc: Oral  Oral   SpO2: 98% 97%  97%    Intake/Output Summary (Last 24 hours) at 03/31/2019 1100 Last data filed at 03/31/2019 0221 Gross per 24 hour  Intake 1260 ml  Output -  Net 1260 ml   There were no vitals filed for this visit.  Examination: General exam: Appears comfortable  HEENT: PERRLA, oral mucosa moist, no sclera icterus or thrush Respiratory system: Clear to auscultation. Respiratory effort normal. Cardiovascular system: S1 & S2 heard, RRR.   Gastrointestinal system: Abdomen soft, non-tender, nondistended. Normal bowel sounds. Central nervous system: Alert and oriented. No focal neurological deficits. Extremities: No cyanosis, clubbing or edema Skin: No rashes or ulcers Psychiatry:  Mood & affect appropriate.     Data Reviewed: I have personally reviewed following labs and imaging studies  CBC: Recent Labs  Lab 03/30/19 1704 03/31/19 0439  WBC 6.9 6.6  6.6  HGB 5.6* 8.0*  8.1*  HCT 18.1* 25.2*  25.3*  MCV 89.2 89.4  88.5  PLT  277 263  254   Basic Metabolic Panel: Recent Labs  Lab 03/30/19 1704 03/31/19 0439  NA 140 142  K 4.5 4.7  CL 108 113*  CO2 23 23  GLUCOSE 136* 91  BUN 19 18  CREATININE 1.90* 1.62*  CALCIUM 8.7* 9.1   GFR: CrCl cannot be calculated (Unknown ideal weight.). Liver Function Tests: Recent Labs  Lab 03/30/19 1704  AST 16  ALT 19  ALKPHOS 49  BILITOT 0.7  PROT 5.9*  ALBUMIN 3.5   No results for input(s): LIPASE, AMYLASE in the last 168 hours. No results for input(s): AMMONIA in the last 168 hours. Coagulation Profile: Recent Labs  Lab 03/30/19 1930  INR 2.7*   Cardiac Enzymes: No results for input(s): CKTOTAL, CKMB, CKMBINDEX, TROPONINI in the  last 168 hours. BNP (last 3 results) No results for input(s): PROBNP in the last 8760 hours. HbA1C: No results for input(s): HGBA1C in the last 72 hours. CBG: Recent Labs  Lab 03/31/19 0732  GLUCAP 85   Lipid Profile: No results for input(s): CHOL, HDL, LDLCALC, TRIG, CHOLHDL, LDLDIRECT in the last 72 hours. Thyroid Function Tests: No results for input(s): TSH, T4TOTAL, FREET4, T3FREE, THYROIDAB in the last 72 hours. Anemia Panel: Recent Labs    03/31/19 0439  VITAMINB12 517  FOLATE 21.3  FERRITIN 8*  TIBC 372  IRON 21*  RETICCTPCT 5.5*   Urine analysis:    Component Value Date/Time   COLORURINE YELLOW 06/27/2018 2335   APPEARANCEUR CLEAR 06/27/2018 2335   LABSPEC 1.012 06/27/2018 2335   PHURINE 6.0 06/27/2018 2335   GLUCOSEU NEGATIVE 06/27/2018 2335   HGBUR NEGATIVE 06/27/2018 2335   BILIRUBINUR NEGATIVE 06/27/2018 2335   KETONESUR NEGATIVE 06/27/2018 2335   PROTEINUR NEGATIVE 06/27/2018 2335   NITRITE NEGATIVE 06/27/2018 2335   LEUKOCYTESUR NEGATIVE 06/27/2018 2335   Sepsis Labs: @LABRCNTIP (procalcitonin:4,lacticidven:4) ) Recent Results (from the past 240 hour(s))  SARS CORONAVIRUS 2 (TAT 6-24 HRS) Nasopharyngeal Nasopharyngeal Swab     Status: None   Collection Time: 03/30/19  7:06 PM   Specimen: Nasopharyngeal Swab  Result Value Ref Range Status   SARS Coronavirus 2 NEGATIVE NEGATIVE Final    Comment: (NOTE) SARS-CoV-2 target nucleic acids are NOT DETECTED. The SARS-CoV-2 RNA is generally detectable in upper and lower respiratory specimens during the acute phase of infection. Negative results do not preclude SARS-CoV-2 infection, do not rule out co-infections with other pathogens, and should not be used as the sole basis for treatment or other patient management decisions. Negative results must be combined with clinical observations, patient history, and epidemiological information. The expected result is Negative. Fact Sheet for Patients:  04/01/19 Fact Sheet for Healthcare Providers: HairSlick.no This test is not yet approved or cleared by the quierodirigir.com FDA and  has been authorized for detection and/or diagnosis of SARS-CoV-2 by FDA under an Emergency Use Authorization (EUA). This EUA will remain  in effect (meaning this test can be used) for the duration of the COVID-19 declaration under Section 56 4(b)(1) of the Act, 21 U.S.C. section 360bbb-3(b)(1), unless the authorization is terminated or revoked sooner. Performed at [facility], [address], [place], [place]          Radiology Studies: No results found.    Scheduled Meds: . allopurinol  100 mg Oral Daily  . amLODipine  5 mg Oral QHS  . multivitamin with minerals  1 tablet Oral Daily  . pantoprazole  40 mg Oral BID  . pravastatin  80  mg Oral QPM   Continuous Infusions: . sodium chloride 75 mL/hr at 03/31/19 0724     LOS: 1 day      Debbe Odea, MD [facility] Pager: [email or web address] Password [email or web address] 03/31/2019, 11:00 AM

## 2019-03-31 NOTE — Consult Note (Addendum)
[facility] Primary Care Physician:  Catha GosselinLittle, Kevin, MD Primary Gastroenterologist:  Dr. Ewing SchleinMagod  Reason for Consultation: GI bleed, symptomatic anemia  HPI: Stephen Chavez is a 85 y.o. male with past medical history of pulmonary embolism on Xarelto, history of chronic kidney disease, history of carotid artery stenosis, history of diastolic CHF presented to the hospital with dark stools and fatigue.  Upon initial evaluation, he was found to have hemoglobin of 5.6, FOBT positive.  GI is consulted for further evaluation.  Patient seen and examined at bedside in the emergency room.  Patient has been having intermittent black tarry stool which he attributed to taking iron supplements.  Denies any bright red blood per rectum.  Denies any abdominal pain, nausea and vomiting.  Denies any diarrhea or constipation.  Patient was seen by Dr. Ewing SchleinMagod for evaluation of anemia few weeks ago.  Underwent EGD March 15, 2019 which showed tiny hiatal hernia and a few small nonbleeding duodenal AVMs.  He was advised to stay off anticoagulation and capsule endoscopy was recommended, but somehow he was restarted on Xarelto.  Last dose of Xarelto 03/29/2019.   Last colonoscopy in May 2012 showed 2 diminutive tubular adenomas, internal and external hemorrhoids.  Repeat colonoscopy was recommended in 5 years.    Past Medical History:  Diagnosis Date  . Amaurosis fugax of right eye 09/03/2016  . Carotid stenosis 09/03/2016   Mild 05/2016  . Chronic combined systolic and diastolic heart failure (HCC) 10/23/2016  . Essential hypertension 09/03/2016  . Hyperlipidemia   . Hypertension     Past Surgical History:  Procedure Laterality Date  . EYE SURGERY    . polyp removal      Prior to Admission medications   Medication Sig Start Date End Date Taking? Authorizing Provider  acetaminophen (TYLENOL) 160 MG/5ML suspension Take 240 mg by mouth every 6 (six) hours as needed for mild pain or headache.   Yes  [provider]  albuterol (PROVENTIL HFA;VENTOLIN HFA) 108 (90 Base) MCG/ACT inhaler Inhale 1-2 puffs into the lungs every 6 (six) hours as needed for wheezing or shortness of breath.   Yes [provider]  allopurinol (ZYLOPRIM) 100 MG tablet Take 100 mg by mouth daily.   Yes [provider]  amLODipine (NORVASC) 5 MG tablet Take 1.5 tablets daily Patient taking differently: Take 5 mg by mouth at bedtime.  07/26/18  Yes Chilton Siandolph, Tiffany, MD  ferrous sulfate 325 (65 FE) MG tablet Take 325 mg by mouth daily with breakfast.   Yes [provider]  fluticasone (FLONASE) 50 MCG/ACT nasal spray Place 2 sprays into both nostrils daily as needed for allergies or rhinitis.   Yes [provider]  loratadine (CLARITIN) 10 MG tablet Take 10 mg by mouth at bedtime as needed for allergies or rhinitis.    Yes [provider]  Multiple Vitamins-Minerals (CENTRUM SILVER) CHEW Chew 2 tablets by mouth daily.   Yes [provider]  pravastatin (PRAVACHOL) 80 MG tablet Take 80 mg by mouth every evening.    Yes [provider]  Psyllium (METAMUCIL FIBER PO) Take by mouth See admin instructions. Mix 1 tablespoonful of powder into 8 ounces of water or juice and drink once a day   Yes [provider]  rivaroxaban (XARELTO) 20 MG TABS tablet Take 20 mg by mouth daily with supper.   Yes [provider]  senna (SENOKOT) 8.6 MG TABS tablet Take 1 tablet by mouth daily.   Yes [provider]  acetaminophen (TYLENOL) 325 MG tablet Take 2 tablets (650 mg total) by mouth every 6 (six) hours as needed for mild pain (or Fever >/= 101). Patient not taking: Reported on 03/30/2019 06/29/18   Joseph Art, DO  ELIQUIS STARTER PACK (ELIQUIS STARTER PACK) 5 MG TABS Take as directed on package: start with two-5mg  tablets twice daily for 7 days. On day 8, switch to one-5mg  tablet twice daily. Patient not taking: Reported on 03/30/2019 06/29/18    Joseph Art, DO  oxyCODONE-acetaminophen (PERCOCET/ROXICET) 5-325 MG tablet Take 1 tablet by mouth every 4 (four) hours as needed for moderate pain. Patient not taking: Reported on 03/30/2019 06/29/18   Joseph Art, DO    Scheduled Meds: . allopurinol  100 mg Oral Daily  . amLODipine  5 mg Oral QHS  . ferrous sulfate  325 mg Oral Q breakfast  . multivitamin with minerals  1 tablet Oral Daily  . pantoprazole  40 mg Oral BID  . pravastatin  80 mg Oral QPM   Continuous Infusions: . sodium chloride 75 mL/hr at 03/31/19 0724   PRN Meds:.acetaminophen, albuterol, fluticasone, hydrALAZINE, loratadine, ondansetron **OR** ondansetron (ZOFRAN) IV, senna  Allergies as of 03/30/2019  . (No Known Allergies)    Family History  Problem Relation Age of Onset  . Hypertension Mother     Social History   Socioeconomic History  . Marital status: Married    Spouse name: Not on file  . Number of children: Not on file  . Years of education: Not on file  . Highest education level: Not on file  Occupational History  . Not on file  Social Needs  . Financial resource strain: Not on file  . Food insecurity    Worry: Not on file    Inability: Not on file  . Transportation needs    Medical: Not on file    Non-medical: Not on file  Tobacco Use  . Smoking status: Never Smoker  . Smokeless tobacco: Never Used  Substance and Sexual Activity  . Alcohol use: Yes    Alcohol/week: 4.0 standard drinks    Types: 4 Standard drinks or equivalent per week  . Drug use: No  . Sexual activity: Not on file  Lifestyle  . Physical activity    Days per week: Not on file    Minutes per session: Not on file  . Stress: Not on file  Relationships  . Social Musician on phone: Not on file    Gets together: Not on file    Attends religious service: Not on file    Active member of club or organization: Not on file    Attends meetings of clubs or organizations: Not on file    Relationship  status: Not on file  . Intimate partner violence    Fear of current or ex partner: Not on file    Emotionally abused: Not on file    Physically abused: Not on file    Forced sexual activity: Not on file  Other Topics Concern  . Not on file  Social History Narrative  . Not on file    Review of Systems:Review of Systems  Constitutional: Negative for chills and fever.  HENT: Negative for hearing loss and tinnitus.   Eyes: Negative for blurred vision and double vision.  Respiratory: Negative for cough and hemoptysis.   Cardiovascular: Negative for chest pain and palpitations.  Gastrointestinal: Positive for melena. Negative for abdominal pain, heartburn, nausea  and vomiting.  Genitourinary: Negative for dysuria and urgency.  Musculoskeletal: Negative for myalgias and neck pain.  Skin: Negative for itching and rash.  Neurological: Positive for dizziness. Negative for seizures.  Endo/Heme/Allergies: Bruises/bleeds easily.  Psychiatric/Behavioral: Negative for hallucinations. The patient is not nervous/anxious.     Physical Exam: Vital signs: Vitals:   03/31/19 0219 03/31/19 0715  BP: (!) 148/76 123/69  Pulse: 66 76  Resp: 12 (!) 22  Temp: 98.3 F (36.8 C)   SpO2:  97%   Physical Exam  Constitutional: He is oriented to person, place, and time. He appears well-developed and well-nourished.  HENT:  Head: Normocephalic and atraumatic.  Mouth/Throat: Oropharynx is clear and moist. No oropharyngeal exudate.  Eyes: EOM are normal. No scleral icterus.  Neck: Normal range of motion. Neck supple.  Cardiovascular: Normal rate and regular rhythm.  Pulmonary/Chest: Effort normal and breath sounds normal.  Abdominal: Soft. He exhibits no distension. There is no abdominal tenderness. There is no rebound and no guarding.  Musculoskeletal: Normal range of motion.        General: No edema.  Neurological: He is alert and oriented to person, place, and time.  Skin: Skin is warm.   Psychiatric: He has a normal mood and affect. Thought content normal.  Vitals reviewed.   GI:  Lab Results: Recent Labs    03/30/19 1704 03/31/19 0439  WBC 6.9 6.6  6.6  HGB 5.6* 8.0*  8.1*  HCT 18.1* 25.2*  25.3*  PLT 277 263  254   BMET Recent Labs    03/30/19 1704 03/31/19 0439  NA 140 142  K 4.5 4.7  CL 108 113*  CO2 23 23  GLUCOSE 136* 91  BUN 19 18  CREATININE 1.90* 1.62*  CALCIUM 8.7* 9.1   LFT Recent Labs    03/30/19 1704  PROT 5.9*  ALBUMIN 3.5  AST 16  ALT 19  ALKPHOS 49  BILITOT 0.7   PT/INR Recent Labs    03/30/19 1930  LABPROT 28.0*  INR 2.7*     Studies/Results: No results found.  Impression/Plan: -Symptomatic anemia. Hgb 5.6 on admission. status post blood transfusion. -GI bleed with dark stool and melena.  Was on Xarelto.  Last dose 2 days ago.  EGD in September showed nonbleeding small duodenal AVMs. -History of pulmonary embolism.  He will not need Xarelto in the future according to Dr. Wynelle Cleveland - Complex Pancreatic cystic lesions. - last MRI on 11/2018. He will need outpatient EUS or repeat MRI  Recommendations --------------------------- -Plan for push enteroscopy and colonoscopy tomorrow. -Okay to have clear liquid diet today.  N.p.o. past midnight.  Risks (bleeding, infection, bowel perforation that could require surgery, sedation-related changes in cardiopulmonary systems), benefits (identification and possible treatment of source of symptoms, exclusion of certain causes of symptoms), and alternatives (watchful waiting, radiographic imaging studies, empiric medical treatment)  were explained to patient/family in detail and patient wishes to proceed.   LOS: 1 day   Otis Brace  MD, FACP 03/31/2019, 8:28 AM  Contact #  [phone]

## 2019-03-31 NOTE — ED Notes (Signed)
Ordered clear liquid diet lunch tray for pt

## 2019-03-31 NOTE — ED Notes (Signed)
Pt is receiving his second unit of PRBC.  Will draw bloodwork which is ordered once this unit is complete

## 2019-04-01 ENCOUNTER — Inpatient Hospital Stay (HOSPITAL_COMMUNITY): Payer: Medicare Other | Admitting: Anesthesiology

## 2019-04-01 ENCOUNTER — Encounter (HOSPITAL_COMMUNITY): Payer: Self-pay | Admitting: Anesthesiology

## 2019-04-01 ENCOUNTER — Encounter (HOSPITAL_COMMUNITY)
Admission: EM | Disposition: A | Discharge status: Home / Self Care | Payer: Self-pay | Source: Home / Self Care | Attending: Internal Medicine

## 2019-04-01 DIAGNOSIS — D509 Iron deficiency anemia, unspecified: Secondary | ICD-10-CM

## 2019-04-01 HISTORY — PX: COLONOSCOPY WITH PROPOFOL: SHX5780

## 2019-04-01 HISTORY — PX: BIOPSY: SHX5522

## 2019-04-01 HISTORY — PX: POLYPECTOMY: SHX5525

## 2019-04-01 HISTORY — PX: HOT HEMOSTASIS: SHX5433

## 2019-04-01 HISTORY — PX: ENTEROSCOPY: SHX5533

## 2019-04-01 LAB — CBC
HCT: 26.4 % — ABNORMAL LOW (ref 39.0–52.0)
Hemoglobin: 8.5 g/dL — ABNORMAL LOW (ref 13.0–17.0)
MCH: 28.2 pg (ref 26.0–34.0)
MCHC: 32.2 g/dL (ref 30.0–36.0)
MCV: 87.7 fL (ref 80.0–100.0)
Platelets: 276 10*3/uL (ref 150–400)
RBC: 3.01 MIL/uL — ABNORMAL LOW (ref 4.22–5.81)
RDW: 15.4 % (ref 11.5–15.5)
WBC: 6.8 10*3/uL (ref 4.0–10.5)
nRBC: 0 % (ref 0.0–0.2)

## 2019-04-01 LAB — BASIC METABOLIC PANEL
Anion gap: 10 (ref 5–15)
BUN: 15 mg/dL (ref 8–23)
CO2: 21 mmol/L — ABNORMAL LOW (ref 22–32)
Calcium: 8.9 mg/dL (ref 8.9–10.3)
Chloride: 109 mmol/L (ref 98–111)
Creatinine, Ser: 1.89 mg/dL — ABNORMAL HIGH (ref 0.61–1.24)
GFR calc Af Amer: 37 mL/min — ABNORMAL LOW (ref >60)
GFR calc non Af Amer: 32 mL/min — ABNORMAL LOW (ref >60)
Glucose, Bld: 85 mg/dL (ref 70–99)
Potassium: 4.3 mmol/L (ref 3.5–5.1)
Sodium: 140 mmol/L (ref 135–145)

## 2019-04-01 LAB — GLUCOSE, CAPILLARY: Glucose-Capillary: 80 mg/dL (ref 70–99)

## 2019-04-01 SURGERY — ENTEROSCOPY
Anesthesia: Monitor Anesthesia Care

## 2019-04-01 MED ORDER — PROPOFOL 10 MG/ML IV BOLUS
INTRAVENOUS | Status: DC | PRN
  Administered 2019-04-01: 30 mg via INTRAVENOUS
  Administered 2019-04-01: 20 mg via INTRAVENOUS

## 2019-04-01 MED ORDER — SODIUM CHLORIDE 0.9 % IV SOLN
INTRAVENOUS | Status: DC | PRN
  Administered 2019-04-01 (×2): via INTRAVENOUS

## 2019-04-01 MED ORDER — SODIUM CHLORIDE 0.9 % IV SOLN
INTRAVENOUS | Status: DC
  Administered 2019-04-01: 11:00:00 via INTRAVENOUS

## 2019-04-01 MED ORDER — PROPOFOL 500 MG/50ML IV EMUL
INTRAVENOUS | Status: DC | PRN
  Administered 2019-04-01: 50 ug/kg/min via INTRAVENOUS

## 2019-04-01 MED ORDER — PANTOPRAZOLE SODIUM 40 MG PO TBEC: 40.0000 mg | DELAYED_RELEASE_TABLET | Freq: Every day | ORAL | 1 refills | Status: DC

## 2019-04-01 MED ORDER — LIDOCAINE 2% (20 MG/ML) 5 ML SYRINGE
INTRAMUSCULAR | Status: DC | PRN
  Administered 2019-04-01: 40 mg via INTRAVENOUS

## 2019-04-01 MED ORDER — PHENYLEPHRINE HCL (PRESSORS) 10 MG/ML IV SOLN
INTRAVENOUS | Status: DC | PRN
  Administered 2019-04-01: 40 ug via INTRAVENOUS
  Administered 2019-04-01: 120 ug via INTRAVENOUS
  Administered 2019-04-01: 80 ug via INTRAVENOUS
  Administered 2019-04-01: 160 ug via INTRAVENOUS
  Administered 2019-04-01: 80 ug via INTRAVENOUS
  Administered 2019-04-01: 40 ug via INTRAVENOUS

## 2019-04-01 MED ORDER — PANTOPRAZOLE SODIUM 40 MG PO TBEC: 40.0000 mg | DELAYED_RELEASE_TABLET | Freq: Every day | ORAL | Status: DC

## 2019-04-01 SURGICAL SUPPLY — 21 items

## 2019-04-01 NOTE — Progress Notes (Signed)
[facility]astroenterology Progress Note  Stephen Chavez 85 y.o. [birth year]  CC: Symptomatic anemia, GI bleed   Subjective: Patient  had melanotic bowel movements during initial part of the prep but subsequently started having clear bowel movements.  Denied any bright red blood per rectum.  Denies abdominal pain, nausea or vomiting.  ROS : Negative for chest pain and shortness of breath.   Objective: Vital signs in last 24 hours: Vitals:   04/01/19 0432 04/01/19 0802  BP: (!) 120/58 135/78  Pulse: 74 77  Resp: 18 20  Temp: 98.9 F (37.2 C) 98.3 F (36.8 C)  SpO2: 99% 99%    Physical Exam:  General:  Alert, cooperative, no distress, appears stated age  Head:  Normocephalic, without obvious abnormality, atraumatic  Eyes:  , EOM's intact,   Lungs:   Clear to auscultation bilaterally, respirations unlabored  Heart:  Regular rate and rhythm, S1, S2 normal  Abdomen:   Soft, non-tender, bowel sounds present, no peritoneal signs, nondistended  Extremities: Extremities normal, atraumatic, no  edema       Lab Results: Recent Labs    03/31/19 0439 04/01/19 0715  NA 142 140  K 4.7 4.3  CL 113* 109  CO2 23 21*  GLUCOSE 91 85  BUN 18 15  CREATININE 1.62* 1.89*  CALCIUM 9.1 8.9   Recent Labs    03/30/19 1704  AST 16  ALT 19  ALKPHOS 49  BILITOT 0.7  PROT 5.9*  ALBUMIN 3.5   Recent Labs    03/31/19 2223 04/01/19 0715  WBC 7.0 6.8  HGB 9.0* 8.5*  HCT 27.7* 26.4*  MCV 86.8 87.7  PLT 284 276   Recent Labs    03/30/19 1930  LABPROT 28.0*  INR 2.7*      Assessment/Plan: -Symptomatic anemia. Hgb 5.6 on admission. status post blood transfusion.  Hemoglobin 8.5 this morning. -GI bleed with dark stool and melena.  Was on Xarelto.  Last dose 3 days ago.  EGD in September showed nonbleeding small duodenal AVMs. -History of pulmonary embolism.  He will not need Xarelto in the future according to Dr. Wynelle Cleveland - Complex Pancreatic cystic lesions. - last MRI on 11/2018.  He will need outpatient EUS or repeat MRI  Recommendations --------------------------- -Plan for push enteroscopy and colonoscopy with possible APC today   Risks (bleeding, infection, bowel perforation that could require surgery, sedation-related changes in cardiopulmonary systems), benefits (identification and possible treatment of source of symptoms, exclusion of certain causes of symptoms), and alternatives (watchful waiting, radiographic imaging studies, empiric medical treatment)  were explained to patient/family in detail and patient wishes to proceed.   Otis Brace MD, [place] 04/01/2019, 9:10 AM  Contact #  [phone]

## 2019-04-01 NOTE — Transfer of Care (Signed)
Immediate Anesthesia Transfer of Care Note  Patient: Stephen Chavez  Procedure(s) Performed: Push ENTEROSCOPY (N/A ) COLONOSCOPY WITH PROPOFOL (N/A ) HOT HEMOSTASIS (ARGON PLASMA COAGULATION/BICAP) (N/A ) POLYPECTOMY BIOPSY  Patient Location: Endoscopy Unit  Anesthesia Type:MAC  Level of Consciousness: awake  Airway & Oxygen Therapy: Patient Spontanous Breathing and Patient connected to nasal cannula oxygen  Post-op Assessment: Report given to RN and Post -op Vital signs reviewed and stable  Post vital signs: Reviewed and stable  Last Vitals:  Vitals Value Taken Time  BP    Temp    Pulse 66 04/01/19 1204  Resp 11 04/01/19 1204  SpO2 97 % 04/01/19 1204  Vitals shown include unvalidated device data.  Last Pain:  Vitals:   04/01/19 1055  TempSrc: Oral  PainSc: 0-No pain         Complications: No apparent anesthesia complications

## 2019-04-01 NOTE — Progress Notes (Signed)
PROGRESS NOTE    Stephen Chavez   [id number]  DOB: Sep 19, 1932  DOA: 03/30/2019 PCP: Hulan Fess, MD   Brief Narrative:  Stephen Chavez is an 85 year old male with PE in January on Xarelto, diastolic heart failure, hypertension, hyperlipidemia, chronic kidney disease stage III, carotid stenosis and amaurosis fugax. The patient presents because he was called from Dr. Perley Jain office for a low hemoglobin -In the ED hemoglobin 5.9 (9.1 on 06/29/2018), INR 2.7 He complains of generalized fatigue and weakness.   Subjective: He is has no complaints today. Initial stool with bowel prep was black & then cleared up.    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 If he is bleeding from the AVM due to Xarelto -  I have spoken with his PCP, Dr Rex Kras, who feels that he can come off of the Xarelto if he does not have pancreatic cancer (which per Dr Watt Climes, he doesn't) - going for endoscopies today  Active Problems: PEs in Jan of 2010 - suspected to be provoked due to travel - he was on a cross country trip for many months - does not need Xarelto for this any longer    Anemia due to blood loss with Iron deficiency - receiving 2 U PRBC now - ferritin is 8-    given a dose of Feraheme - on oral Iron at home - he has an appt with hematology for 10/26    Essential hypertension - on Norvasc 5 mg and PRN Hydralazine- BP is stable and not low due to the blood loss    Chronic diastolic CHF (congestive heart failure) - grade 1 per ECHO on 06/28/18 - not on diuretics as outpt - no crackles, dyspnea or hypoxia noted    HLD (hyperlipidemia) - Pravachol    CKD (chronic kidney disease), stage III - stable  Time spent in minutes: 35 DVT prophylaxis:  SCDs Code Status: Full code Family  Communication:  Disposition Plan: f/u on GI eval Consultants:   GI Procedures:   none Antimicrobials:  Anti-infectives (From admission, onward)   None       Objective: Vitals:   03/31/19 2015 03/31/19 2349 04/01/19 0432 04/01/19 0802  BP: (!) 145/80 121/64 (!) 120/58 135/78  Pulse: 86 75 74 77  Resp: 18 18 18 20   Temp: 98.6 F (37 C) 98.7 F (37.1 C) 98.9 F (37.2 C) 98.3 F (36.8 C)  TempSrc: Oral Oral Oral Oral  SpO2: 100% 99% 99% 99%   No intake or output data in the 24 hours ending 04/01/19 0945 There were no vitals filed for this visit.  Examination: General exam: Appears comfortable  HEENT: PERRLA, oral mucosa moist, no sclera icterus or thrush Respiratory system: Clear to auscultation. Respiratory effort normal. Cardiovascular system: S1 & S2 heard,  No murmurs  Gastrointestinal system: Abdomen soft, non-tender, nondistended. Normal bowel sounds   Central nervous system: Alert and oriented. No focal neurological deficits. Extremities: No cyanosis, clubbing or edema Skin: No rashes or ulcers Psychiatry:  Mood & affect appropriate.     Data Reviewed: I have personally reviewed following labs and imaging studies  CBC: Recent Labs  Lab 03/30/19 1704 03/31/19 0439 03/31/19 2223 04/01/19 0715  WBC 6.9 6.6  6.6 7.0  6.8  HGB 5.6* 8.0*  8.1* 9.0* 8.5*  HCT 18.1* 25.2*  25.3* 27.7* 26.4*  MCV 89.2 89.4  88.5 86.8 87.7  PLT 277 263  254 284 276   Basic Metabolic Panel: Recent Labs  Lab 03/30/19 1704 03/31/19 0439 04/01/19 0715  NA 140 142 140  K 4.5 4.7 4.3  CL 108 113* 109  CO2 23 23 21*  GLUCOSE 136* 91 85  BUN 19 18 15   CREATININE 1.90* 1.62* 1.89*  CALCIUM 8.7* 9.1 8.9   GFR: CrCl cannot be calculated (Unknown ideal weight.). Liver Function Tests: Recent Labs  Lab 03/30/19 1704  AST 16  ALT 19  ALKPHOS 49  BILITOT 0.7  PROT 5.9*  ALBUMIN 3.5   No results for input(s): LIPASE, AMYLASE in the last 168 hours. No results for  input(s): AMMONIA in the last 168 hours. Coagulation Profile: Recent Labs  Lab 03/30/19 1930  INR 2.7*   Cardiac Enzymes: No results for input(s): CKTOTAL, CKMB, CKMBINDEX, TROPONINI in the last 168 hours. BNP (last 3 results) No results for input(s): PROBNP in the last 8760 hours. HbA1C: No results for input(s): HGBA1C in the last 72 hours. CBG: Recent Labs  Lab 03/31/19 0732 04/01/19 0600  GLUCAP 85 80   Lipid Profile: No results for input(s): CHOL, HDL, LDLCALC, TRIG, CHOLHDL, LDLDIRECT in the last 72 hours. Thyroid Function Tests: No results for input(s): TSH, T4TOTAL, FREET4, T3FREE, THYROIDAB in the last 72 hours. Anemia Panel: Recent Labs    03/31/19 0439  VITAMINB12 517  FOLATE 21.3  FERRITIN 8*  TIBC 372  IRON 21*  RETICCTPCT 5.5*   Urine analysis:    Component Value Date/Time   COLORURINE YELLOW 06/27/2018 2335   APPEARANCEUR CLEAR 06/27/2018 2335   LABSPEC 1.012 06/27/2018 2335   PHURINE 6.0 06/27/2018 2335   GLUCOSEU NEGATIVE 06/27/2018 2335   HGBUR NEGATIVE 06/27/2018 2335   BILIRUBINUR NEGATIVE 06/27/2018 2335   KETONESUR NEGATIVE 06/27/2018 2335   PROTEINUR NEGATIVE 06/27/2018 2335   NITRITE NEGATIVE 06/27/2018 2335   LEUKOCYTESUR NEGATIVE 06/27/2018 2335   Sepsis Labs: @LABRCNTIP (procalcitonin:4,lacticidven:4) ) Recent Results (from the past 240 hour(s))  SARS CORONAVIRUS 2 (TAT 6-24 HRS) Nasopharyngeal Nasopharyngeal Swab     Status: None   Collection Time: 03/30/19  7:06 PM   Specimen: Nasopharyngeal Swab  Result Value Ref Range Status   SARS Coronavirus 2 NEGATIVE NEGATIVE Final    Comment: (NOTE) SARS-CoV-2 target nucleic acids are NOT DETECTED. The SARS-CoV-2 RNA is generally detectable in upper and lower respiratory specimens during the acute phase of infection. Negative results do not preclude SARS-CoV-2 infection, do not rule out co-infections with other pathogens, and should not be used as the sole basis for treatment or other  patient management decisions. Negative results must be combined with clinical observations, patient history, and epidemiological information. The expected result is Negative. Fact Sheet for Patients: HairSlick.nohttps://www.fda.gov/media/138098/download Fact Sheet for Healthcare Providers: quierodirigir.comhttps://www.fda.gov/media/138095/download This test is not yet approved or cleared by the [place] FDA and  has been authorized for detection and/or diagnosis of SARS-CoV-2 by FDA under an Emergency Use Authorization (EUA). This EUA will remain  in effect (meaning this test can be used) for the duration of the COVID-19 declaration under Section 56 4(b)(1) of the Act, 21 U.S.C. section 360bbb-3(b)(1), unless the authorization is terminated or revoked sooner. Performed at [facility], [address] [place], [place] [address]          Radiology Studies: No results found.  Scheduled Meds: . allopurinol  100 mg Oral Daily  . amLODipine  5 mg Oral QHS  . multivitamin with minerals  1 tablet Oral Daily  . pantoprazole  40 mg Oral BID  . pravastatin  80 mg Oral QPM   Continuous Infusions:    LOS: 2 days      Calvert Cantor, MD [facility] Pager: [email or web address] Password [email or web address] 04/01/2019, 9:45 AM

## 2019-04-01 NOTE — Anesthesia Postprocedure Evaluation (Signed)
Anesthesia Post Note  Patient: Stephen Chavez  Procedure(s) Performed: Push ENTEROSCOPY (N/A ) COLONOSCOPY WITH PROPOFOL (N/A ) HOT HEMOSTASIS (ARGON PLASMA COAGULATION/BICAP) (N/A ) POLYPECTOMY BIOPSY     Patient location during evaluation: Endoscopy Anesthesia Type: MAC Level of consciousness: awake and alert Pain management: pain level controlled Vital Signs Assessment: post-procedure vital signs reviewed and stable Respiratory status: spontaneous breathing, nonlabored ventilation and respiratory function stable Cardiovascular status: stable and blood pressure returned to baseline Postop Assessment: no apparent nausea or vomiting Anesthetic complications: no    Last Vitals:  Vitals:   04/01/19 1224 04/01/19 1234  BP: (!) 124/54 (!) 132/56  Pulse: 62 64  Resp: 13 14  Temp:    SpO2: 100% 100%    Last Pain:  Vitals:   04/01/19 1204  TempSrc:   PainSc: 0-No pain                 Sheletha Bow,W. EDMOND

## 2019-04-01 NOTE — Anesthesia Procedure Notes (Signed)
Procedure Name: MAC Date/Time: 04/01/2019 11:10 AM Performed by: Leonor Liv, CRNA Pre-anesthesia Checklist: Patient identified, Emergency Drugs available, Suction available, Patient being monitored and Timeout performed Patient Re-evaluated:Patient Re-evaluated prior to induction Oxygen Delivery Method: Nasal cannula Placement Confirmation: positive ETCO2 Dental Injury: Teeth and Oropharynx as per pre-operative assessment

## 2019-04-01 NOTE — Discharge Summary (Signed)
Physician Discharge Summary  Stephen Chavez [id number]:[id number] DOB: [birth year] DOA: 03/30/2019  PCP: [name], MD  Admit date: 03/30/2019 Discharge date: 04/01/2019  Admitted From:home Disposition:  home   Recommendations for Outpatient Follow-up:  1. Hematology or PCP should check Hb in 2 wks   2. Surgical path will need to be followed by GI  Discharge Condition:  stable  CODE STATUS:  Full code   Diet recommendation:  Heart healthy Consultations:  GI    Discharge Diagnoses:  Principal Problem:   GIB (gastrointestinal bleeding) Active Problems:   Anemia due to blood loss  Iron deficiency anemia   Essential hypertension   Chronic diastolic CHF (congestive heart failure) (HCC)   HLD (hyperlipidemia)   CKD (chronic kidney disease), stage III   Pancreatic cyst    Brief Summary: Stephen Chavez is an 85 year old male with PE in January on Xarelto, diastolic heart failure, hypertension, hyperlipidemia, chronic kidney disease stage III, carotid stenosis and amaurosis fugax. The patient presents because he was called from Dr. Marlane Hatcher office for a low hemoglobin -In the ED hemoglobin 5.9(9.1 on 06/29/2018), INR 2.7 He complained of generalized fatigue and weakness.  Hospital Course:  GIB (gastrointestinal bleeding) - I spoke with Dr Ewing Schlein- he has a h/o pancreatic cyst and and EGD 2 wks ago showed AVMs which were not bleeding at the time- he was off of Xarelto but has been placed back on it- ? 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 and 2 gastric erosions treated with APC, 6 colon polyps removed- see full report below - recommendations per GI> Protonix 40 mg daily x 4 wks f/u with Dr Ewing Schlein in 4-6 wks for follow up of complex pancreatic  cyst  Active Problems:    Symptomatic Anemia due to acute blood loss with severe Iron deficiency - suspect bleeding from AVMs and gastric erosions in setting of Xarelto - receivied 2 U PRBC  - ferritin is 8-    given a dose of Feraheme - on oral Iron at home - he has an appt with hematology for 10/26   PEs in Jan of 2010 - suspected to be provoked due to travel - he was on a cross country trip for many months - does not need Xarelto for this any longer    Essential hypertension - on Norvasc 5 mg and PRN Hydralazine- BP is stable and not low due to the blood loss    Chronic diastolic CHF (congestive heart failure) - grade 1 per ECHO on 06/28/18 - not on diuretics as outpt - no crackles, dyspnea or hypoxia noted    HLD (hyperlipidemia) - Pravachol    CKD (chronic kidney disease), stage III - stable   Discharge Exam: Vitals:   04/01/19 1224 04/01/19 1234  BP: (!) 124/54 (!) 132/56  Pulse: 62 64  Resp: 13 14  Temp:    SpO2: 100% 100%   Vitals:   04/01/19 1204 04/01/19 1214 04/01/19 1224 04/01/19 1234  BP: 124/70 (!) 107/57 (!) 124/54 (!) 132/56  Pulse:   62 64  Resp: 17 17 13 14   Temp:      TempSrc:      SpO2: 98%  100% 100%  Weight:  General: Pt is alert, awake, not in acute distress Cardiovascular: RRR, S1/S2 +, no rubs, no gallops Respiratory: CTA bilaterally, no wheezing, no rhonchi Abdominal: Soft, NT, ND, bowel sounds + Extremities: no edema, no cyanosis   Discharge Instructions  Discharge Instructions    Diet - low sodium heart healthy   Complete by: As directed    Increase activity slowly   Complete by: As directed      Allergies as of 04/01/2019   No Known Allergies     Medication List    STOP taking these medications   rivaroxaban 20 MG Tabs tablet Commonly known as: XARELTO     TAKE these medications   acetaminophen 160 MG/5ML suspension Commonly known as: TYLENOL Take 240 mg by mouth every 6 (six) hours as needed for  mild pain or headache.   albuterol 108 (90 Base) MCG/ACT inhaler Commonly known as: VENTOLIN HFA Inhale 1-2 puffs into the lungs every 6 (six) hours as needed for wheezing or shortness of breath.   allopurinol 100 MG tablet Commonly known as: ZYLOPRIM Take 100 mg by mouth daily.   amLODipine 5 MG tablet Commonly known as: NORVASC Take 1.5 tablets daily What changed:   how much to take  how to take this  when to take this  additional instructions   Centrum Silver Chew Chew 2 tablets by mouth daily.   ferrous sulfate 325 (65 FE) MG tablet Take 325 mg by mouth daily with breakfast.   fluticasone 50 MCG/ACT nasal spray Commonly known as: FLONASE Place 2 sprays into both nostrils daily as needed for allergies or rhinitis.   loratadine 10 MG tablet Commonly known as: CLARITIN Take 10 mg by mouth at bedtime as needed for allergies or rhinitis.   METAMUCIL FIBER PO Take by mouth See admin instructions. Mix 1 tablespoonful of powder into 8 ounces of water or juice and drink once a day   pantoprazole 40 MG tablet Commonly known as: Protonix Take 1 tablet (40 mg total) by mouth daily.   pravastatin 80 MG tablet Commonly known as: PRAVACHOL Take 80 mg by mouth every evening.   senna 8.6 MG Tabs tablet Commonly known as: SENOKOT Take 1 tablet by mouth daily.      Follow-up Information    Vida RiggerMagod, Marc, MD. Schedule an appointment as soon as possible for a visit in 5 week(s).   Specialty: Gastroenterology Why: Symptomatic anemia, GI bleed, complex pancreatic cyst Contact information: [address] [place] [address] [phone]          No Known Allergies   Procedures/Studies: Push enteroscopy and colonoscopy  -Push enteroscopy showed 3 nonbleeding AVMs in the third portion of the duodenum which were treated with APC.  2 gastric erosions in the stomach treated with APC.  Mild gastritis in the fundus.  -Colonoscopy showed no evidence of  bleeding in the terminal ileum as well as in the entire examined colon.  6 small  polyps removed.  Large internal hemorrhoids.  No results found.   The results of significant diagnostics from this hospitalization (including imaging, microbiology, ancillary and laboratory) are listed below for reference.     Microbiology: Recent Results (from the past 240 hour(s))  SARS CORONAVIRUS 2 (TAT 6-24 HRS) Nasopharyngeal Nasopharyngeal Swab     Status: None   Collection Time: 03/30/19  7:06 PM   Specimen: Nasopharyngeal Swab  Result Value Ref Range Status   SARS Coronavirus 2 NEGATIVE NEGATIVE Final    Comment: (NOTE) SARS-CoV-2  target nucleic acids are NOT DETECTED. The SARS-CoV-2 RNA is generally detectable in upper and lower respiratory specimens during the acute phase of infection. Negative results do not preclude SARS-CoV-2 infection, do not rule out co-infections with other pathogens, and should not be used as the sole basis for treatment or other patient management decisions. Negative results must be combined with clinical observations, patient history, and epidemiological information. The expected result is Negative. Fact Sheet for Patients: SugarRoll.be Fact Sheet for Healthcare Providers: https://www.woods-mathews.com/ This test is not yet approved or cleared by the [place] FDA and  has been authorized for detection and/or diagnosis of SARS-CoV-2 by FDA under an Emergency Use Authorization (EUA). This EUA will remain  in effect (meaning this test can be used) for the duration of the COVID-19 declaration under Section 56 4(b)(1) of the Act, 21 U.S.C. section 360bbb-3(b)(1), unless the authorization is terminated or revoked sooner. Performed at [facility], [address] [place], [place] [address]      Labs: BNP (last 3 results) Recent Labs    06/27/18 2335 03/31/19 0439  BNP 13.5 51.0   Basic Metabolic  Panel: Recent Labs  Lab 03/30/19 1704 03/31/19 0439 04/01/19 0715  NA 140 142 140  K 4.5 4.7 4.3  CL 108 113* 109  CO2 23 23 21*  GLUCOSE 136* 91 85  BUN 19 18 15   CREATININE 1.90* 1.62* 1.89*  CALCIUM 8.7* 9.1 8.9   Liver Function Tests: Recent Labs  Lab 03/30/19 1704  AST 16  ALT 19  ALKPHOS 49  BILITOT 0.7  PROT 5.9*  ALBUMIN 3.5   No results for input(s): LIPASE, AMYLASE in the last 168 hours. No results for input(s): AMMONIA in the last 168 hours. CBC: Recent Labs  Lab 03/30/19 1704 03/31/19 0439 03/31/19 2223 04/01/19 0715  WBC 6.9 6.6  6.6 7.0 6.8  HGB 5.6* 8.0*  8.1* 9.0* 8.5*  HCT 18.1* 25.2*  25.3* 27.7* 26.4*  MCV 89.2 89.4  88.5 86.8 87.7  PLT 277 263  254 284 276   Cardiac Enzymes: No results for input(s): CKTOTAL, CKMB, CKMBINDEX, TROPONINI in the last 168 hours. BNP: Invalid input(s): POCBNP CBG: Recent Labs  Lab 03/31/19 0732 04/01/19 0600  GLUCAP 85 80   D-Dimer No results for input(s): DDIMER in the last 72 hours. Hgb A1c No results for input(s): HGBA1C in the last 72 hours. Lipid Profile No results for input(s): CHOL, HDL, LDLCALC, TRIG, CHOLHDL, LDLDIRECT in the last 72 hours. Thyroid function studies No results for input(s): TSH, T4TOTAL, T3FREE, THYROIDAB in the last 72 hours.  Invalid input(s): FREET3 Anemia work up Recent Labs    03/31/19 0439  VITAMINB12 517  FOLATE 21.3  FERRITIN 8*  TIBC 372  IRON 21*  RETICCTPCT 5.5*   Urinalysis    Component Value Date/Time   COLORURINE YELLOW 06/27/2018 2335   APPEARANCEUR CLEAR 06/27/2018 2335   LABSPEC 1.012 06/27/2018 2335   PHURINE 6.0 06/27/2018 2335   GLUCOSEU NEGATIVE 06/27/2018 2335   HGBUR NEGATIVE 06/27/2018 2335   [place] NEGATIVE 06/27/2018 2335   KETONESUR NEGATIVE 06/27/2018 2335   PROTEINUR NEGATIVE 06/27/2018 2335   NITRITE NEGATIVE 06/27/2018 2335   LEUKOCYTESUR NEGATIVE 06/27/2018 2335   Sepsis Labs Invalid input(s): PROCALCITONIN,  WBC,   LACTICIDVEN Microbiology Recent Results (from the past 240 hour(s))  SARS CORONAVIRUS 2 (TAT 6-24 HRS) Nasopharyngeal Nasopharyngeal Swab     Status: None   Collection Time: 03/30/19  7:06 PM   Specimen: Nasopharyngeal Swab  Result Value Ref Range Status   SARS Coronavirus 2 NEGATIVE NEGATIVE Final    Comment: (NOTE) SARS-CoV-2 target nucleic acids are NOT DETECTED. The SARS-CoV-2 RNA is generally detectable in upper and lower respiratory specimens during the acute phase of infection. Negative results do not preclude SARS-CoV-2 infection, do not rule out co-infections with other pathogens, and should not be used as the sole basis for treatment or other patient management decisions. Negative results must be combined with clinical observations, patient history, and epidemiological information. The expected result is Negative. Fact Sheet for Patients: HairSlick.no Fact Sheet for Healthcare Providers: quierodirigir.com This test is not yet approved or cleared by the [place] FDA and  has been authorized for detection and/or diagnosis of SARS-CoV-2 by FDA under an Emergency Use Authorization (EUA). This EUA will remain  in effect (meaning this test can be used) for the duration of the COVID-19 declaration under Section 56 4(b)(1) of the Act, 21 U.S.C. section 360bbb-3(b)(1), unless the authorization is terminated or revoked sooner. Performed at [facility], [address] [place], [place] [address]      Time coordinating discharge in minutes: 65  SIGNED:   Calvert Cantor, MD  [facility] 04/01/2019, 2:57 PM Pager   If 7PM-7AM, please contact night-coverage [email or web address] Password [email or web address]

## 2019-04-01 NOTE — Op Note (Signed)
[facility] Patient Name: Stephen Chavez Procedure Date : 04/01/2019 MRN: [id number] Attending MD: Otis Brace , MD Date of Birth: 05/05/1933 CSN: [id number] Age: 85 Admit Type: Inpatient Procedure:                Small bowel enteroscopy Indications:              Acute post hemorrhagic anemia, Melena Providers:                Otis Brace, MD, Carlyn Reichert, RN, William Dalton, Technician Referring MD:              Medicines:                Sedation Administered by an Anesthesia Professional Complications:            No immediate complications. Estimated Blood Loss:     Estimated blood loss was minimal. Estimated blood                            loss was minimal. Procedure:                Pre-Anesthesia Assessment:                           - Prior to the procedure, a History and Physical                            was performed, and patient medications and                            allergies were reviewed. The patient's tolerance of                            previous anesthesia was also reviewed. The risks                            and benefits of the procedure and the sedation                            options and risks were discussed with the patient.                            All questions were answered, and informed consent                            was obtained. Prior Anticoagulants: The patient has                            taken Xarelto (rivaroxaban), last dose was 3 days                            prior to procedure. ASA Grade Assessment: III - A  patient with severe systemic disease. After                            reviewing the risks and benefits, the patient was                            deemed in satisfactory condition to undergo the                            procedure.                           After obtaining informed consent, the endoscope was                            passed under  direct vision. Throughout the                            procedure, the patient's blood pressure, pulse, and                            oxygen saturations were monitored continuously. The                            PCF-PH190L ([id number] Olympus ultra slim colonoscope                            was introduced through the mouth and advanced to                            the jejunum, to the 120 cm mark (from the                            incisors). The small bowel enteroscopy was                            accomplished without difficulty. The patient                            tolerated the procedure well. Scope In: Scope Out: Findings:      The Z-line was regular and was found 40 cm from the incisors.      A single localized small erosion was found in the gastric body.       Fulguration to ablate the lesion to prevent bleeding by argon plasma was       successful.      Scattered mild inflammation characterized by congestion (edema),       erosions and erythema was found in the gastric fundus. APC was done on       one lesion.      Three angioectasias with no bleeding were found in the third portion of       the duodenum. Fulguration to ablate the lesion to prevent bleeding by       argon plasma was successful.      Normal mucosa was found in the jejunum. Impression:               -  Z-line regular, 40 cm from the incisors.                           - Erosive gastropathy. Treated with argon plasma                            coagulation (APC).                           - Gastritis.                           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                            Treated with argon plasma coagulation (APC).                           - Normal mucosa was found in the jejunum.                           - No specimens collected. Recommendation:           - Perform a colonoscopy today. Procedure Code(s):        --- Professional ---                           [phone], Small  intestinal endoscopy, enteroscopy                            beyond second portion of duodenum, not including                            ileum; with control of bleeding (eg, injection,                            bipolar cautery, unipolar cautery, laser, heater                            probe, stapler, plasma coagulator) Diagnosis Code(s):        --- Professional ---                           K31.89, Other diseases of stomach and duodenum                           K29.70, Gastritis, unspecified, without bleeding                           K31.819, Angiodysplasia of stomach and duodenum                            without bleeding                           D62, Acute posthemorrhagic anemia  K92.1, Melena (includes Hematochezia) CPT copyright 2019 American [employer] Association. All rights reserved. The codes documented in this report are preliminary and upon coder review may  be revised to meet current compliance requirements. Kathi Der, MD Kathi Der, MD 04/01/2019 12:05:11 PM Number of Addenda: 0

## 2019-04-01 NOTE — Discharge Instructions (Signed)

## 2019-04-01 NOTE — Anesthesia Preprocedure Evaluation (Addendum)
Anesthesia Evaluation  Patient identified by MRN, date of birth, ID band Patient awake    Reviewed: Allergy & Precautions, H&P , NPO status , Patient's Chart, lab work & pertinent test results  Airway Mallampati: II  TM Distance: >3 FB Neck ROM: Full    Dental no notable dental hx. (+) Partial Lower, Partial Upper, Dental Advisory Given   Pulmonary neg pulmonary ROS,    Pulmonary exam normal breath sounds clear to auscultation       Cardiovascular hypertension, Pt. on medications + Peripheral Vascular Disease and +CHF   Rhythm:Regular Rate:Normal     Neuro/Psych negative neurological ROS  negative psych ROS   GI/Hepatic negative GI ROS, Neg liver ROS,   Endo/Other  negative endocrine ROS  Renal/GU Renal InsufficiencyRenal disease  negative genitourinary   Musculoskeletal   Abdominal   Peds  Hematology  (+) Blood dyscrasia, anemia ,   Anesthesia Other Findings   Reproductive/Obstetrics negative OB ROS                            Anesthesia Physical Anesthesia Plan  ASA: III  Anesthesia Plan: MAC   Post-op Pain Management:    Induction: Intravenous  PONV Risk Score and Plan: 1 and Propofol infusion  Airway Management Planned: Simple Face Mask  Additional Equipment:   Intra-op Plan:   Post-operative Plan:   Informed Consent: I have reviewed the patients History and Physical, chart, labs and discussed the procedure including the risks, benefits and alternatives for the proposed anesthesia with the patient or authorized representative who has indicated his/her understanding and acceptance.     Dental advisory given  Plan Discussed with: CRNA  Anesthesia Plan Comments:         Anesthesia Quick Evaluation

## 2019-04-01 NOTE — Brief Op Note (Signed)
03/30/2019 - 04/01/2019  12:13 PM  PATIENT:  Stephen Chavez  85 y.o. male  PRE-OPERATIVE DIAGNOSIS:  Symptomatic anemia, GI bleed  POST-OPERATIVE DIAGNOSIS:  enteroscopy: small bowel AVM's x3 APC'd, gastritis, multiple linear gastric erosions and gastric AVM's  APC'd Colonoscopy: transverse colon  polyp x2 removed hot snare, descending colon polyp removed x1 biopsy forcep and x1 hot snare, ascending colon polyps x1 removed hot snare  PROCEDURE:  Procedure(s): Push ENTEROSCOPY (N/A) COLONOSCOPY WITH PROPOFOL (N/A) HOT HEMOSTASIS (ARGON PLASMA COAGULATION/BICAP) (N/A) POLYPECTOMY BIOPSY  SURGEON:  Surgeon(s) and Role:    * Jaramiah Bossard, MD - Primary  Findings --------- -Push enteroscopy showed 3 nonbleeding AVMs in the third portion of the duodenum which were treated with APC.  2 gastric erosions in the stomach treated with APC.  Mild gastritis in the fundus.  -Colonoscopy showed no evidence of bleeding in the terminal ileum as well as in the entire examined colon.  6 small  polyps removed.  Large internal hemorrhoids.  Recommendations -------------------------- -Advance diet to soft -Change Protonix to p.o. once a day.  Continue Protonix 40 mg once a day for 4 weeks. -Okay to discharge from GI standpoint later today or tomorrow if no further bleeding. -Follow-up with Dr. 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  Call us back if needed  Otis Brace MD, [place] 04/01/2019, 12:15 PM  Contact #  [phone]

## 2019-04-01 NOTE — Op Note (Addendum)
[facility] Patient Name: Stephen Chavez Procedure Date : 04/01/2019 MRN: [id number] Attending MD: Otis Brace , MD Date of Birth: 10-27-1932 CSN: [id number] Age: 85 Admit Type: Inpatient Procedure:                Colonoscopy Indications:              Acute post hemorrhagic anemia, Personal history of                            colonic polyps Providers:                Otis Brace, MD, Carlyn Reichert, RN, William Dalton, Technician Referring MD:              Medicines:                Sedation Administered by an Anesthesia Professional Complications:            No immediate complications. Estimated Blood Loss:     Estimated blood loss was minimal. Procedure:                Pre-Anesthesia Assessment:                           - Prior to the procedure, a History and Physical                            was performed, and patient medications and                            allergies were reviewed. The patient's tolerance of                            previous anesthesia was also reviewed. The risks                            and benefits of the procedure and the sedation                            options and risks were discussed with the patient.                            All questions were answered, and informed consent                            was obtained. Prior Anticoagulants: The patient has                            taken Xarelto (rivaroxaban), last dose was 3 days                            prior to procedure. ASA Grade Assessment: III - A  patient with severe systemic disease. After                            reviewing the risks and benefits, the patient was                            deemed in satisfactory condition to undergo the                            procedure.                           - Prior to the procedure, a History and Physical                            was performed, and patient  medications and                            allergies were reviewed. The patient's tolerance of                            previous anesthesia was also reviewed. The risks                            and benefits of the procedure and the sedation                            options and risks were discussed with the patient.                            All questions were answered, and informed consent                            was obtained. Prior Anticoagulants: The patient has                            taken Xarelto (rivaroxaban), last dose was 3 days                            prior to procedure. ASA Grade Assessment: III - A                            patient with severe systemic disease. After                            reviewing the risks and benefits, the patient was                            deemed in satisfactory condition to undergo the                            procedure.  After obtaining informed consent, the colonoscope                            was passed under direct vision. Throughout the                            procedure, the patient's blood pressure, pulse, and                            oxygen saturations were monitored continuously. The                            PCF-[id number] ([id number]) Olympus pediatric colonoscope                            was introduced through the anus and advanced to the                            the terminal ileum, with identification of the                            appendiceal orifice and IC valve. The colonoscopy                            was performed without difficulty. The patient                            tolerated the procedure well. The quality of the                            bowel preparation was good. Scope In: 11:35:29 AM Scope Out: 11:57:56 AM Scope Withdrawal Time: 0 hours 10 minutes 37 seconds  Total Procedure Duration: 0 hours 22 minutes 27 seconds  Findings:      The perianal and digital rectal  examinations were normal.      The terminal ileum appeared normal.      A 4 mm polyp was found in the ascending colon. The polyp was sessile.       The polyp was removed with a hot snare. Resection and retrieval were       complete.      Two sessile polyps were found in the transverse colon. The polyps were 4       to 5 mm in size. These polyps were removed with a hot snare. Resection       and retrieval were complete.      A 2 mm polyp was found in the descending colon. The polyp was removed       with a cold biopsy forceps. Resection and retrieval were complete.      A 4 mm polyp was found in the descending colon. The polyp was sessile.       The polyp was removed with a hot snare. Resection and retrieval were       complete.      A few diverticula were found in the sigmoid colon.      A 3 mm polyp was found in the rectum. The polyp was removed with a  cold       biopsy forceps. Resection and retrieval were complete.      Internal hemorrhoids were found during retroflexion. The hemorrhoids       were large.      There is no endoscopic evidence of bleeding in the entire colon. Impression:               - The examined portion of the ileum was normal.                           - One 4 mm polyp in the ascending colon, removed                            with a hot snare. Resected and retrieved.                           - Two 4 to 5 mm polyps in the transverse colon,                            removed with a hot snare. Resected and retrieved.                           - One 2 mm polyp in the descending colon, removed                            with a cold biopsy forceps. Resected and retrieved.                           - One 4 mm polyp in the descending colon, removed                            with a hot snare. Resected and retrieved.                           - Diverticulosis in the sigmoid colon.                           - One 3 mm polyp in the rectum, removed with a cold                             biopsy forceps. Resected and retrieved.                           - Internal hemorrhoids. Recommendation:           - Return patient to hospital ward for ongoing care.                           - Soft diet.                           - Continue present medications.                           - No repeat colonoscopy due  to age. Procedure Code(s):        --- Professional ---                           [id number], Colonoscopy, flexible; with removal of                            tumor(s), polyp(s), or other lesion(s) by snare                            technique                           45380, 59, Colonoscopy, flexible; with biopsy,                            single or multiple Diagnosis Code(s):        --- Professional ---                           K64.8, Other hemorrhoids                           K63.5, Polyp of colon                           K62.1, Rectal polyp                           D62, Acute posthemorrhagic anemia                           Z86.010, Personal history of colonic polyps                           K57.30, Diverticulosis of large intestine without                            perforation or abscess without bleeding CPT copyright 2019 American Medical Association. All rights reserved. The codes documented in this report are preliminary and upon coder review may  be revised to meet current compliance requirements. Kathi DerParag Heavan Francom, MD Kathi DerParag Maykayla Highley, MD 04/01/2019 12:10:31 PM Number of Addenda: 0

## 2019-04-01 NOTE — Plan of Care (Signed)
Plan of care met. Pt adequate for discharge.

## 2019-04-01 NOTE — Care Management Important Message (Signed)
Important Message  Patient Details  Name: Stephen Chavez MRN: [id number] Date of Birth: June 12, 1933   Medicare Important Message Given:  Yes     Orbie Pyo 04/01/2019, 2:32 PM

## 2019-04-03 ENCOUNTER — Encounter (HOSPITAL_COMMUNITY): Payer: Self-pay | Admitting: Gastroenterology

## 2019-04-04 LAB — SURGICAL PATHOLOGY

## 2019-04-11 ENCOUNTER — Encounter: Payer: Self-pay | Admitting: Internal Medicine

## 2019-04-11 ENCOUNTER — Inpatient Hospital Stay: Payer: Medicare Other | Attending: Internal Medicine | Admitting: Internal Medicine

## 2019-04-11 ENCOUNTER — Inpatient Hospital Stay: Payer: Medicare Other

## 2019-04-11 ENCOUNTER — Other Ambulatory Visit: Payer: Self-pay

## 2019-04-11 VITALS — BP 127/80 | HR 78 | Temp 98.7°F | Resp 18 | Ht 73.0 in | Wt 166.6 lb

## 2019-04-11 DIAGNOSIS — D509 Iron deficiency anemia, unspecified: Secondary | ICD-10-CM

## 2019-04-11 DIAGNOSIS — Z87891 Personal history of nicotine dependence: Secondary | ICD-10-CM | POA: Diagnosis not present

## 2019-04-11 DIAGNOSIS — D649 Anemia, unspecified: Secondary | ICD-10-CM

## 2019-04-11 DIAGNOSIS — K922 Gastrointestinal hemorrhage, unspecified: Secondary | ICD-10-CM

## 2019-04-11 DIAGNOSIS — D508 Other iron deficiency anemias: Secondary | ICD-10-CM | POA: Insufficient documentation

## 2019-04-11 DIAGNOSIS — Z79899 Other long term (current) drug therapy: Secondary | ICD-10-CM | POA: Diagnosis not present

## 2019-04-11 DIAGNOSIS — I10 Essential (primary) hypertension: Secondary | ICD-10-CM

## 2019-04-11 DIAGNOSIS — Z86711 Personal history of pulmonary embolism: Secondary | ICD-10-CM | POA: Diagnosis not present

## 2019-04-11 DIAGNOSIS — E785 Hyperlipidemia, unspecified: Secondary | ICD-10-CM | POA: Diagnosis not present

## 2019-04-11 LAB — CBC WITH DIFFERENTIAL (CANCER CENTER ONLY)
Abs Immature Granulocytes: 0.03 10*3/uL (ref 0.00–0.07)
Basophils Absolute: 0 10*3/uL (ref 0.0–0.1)
Basophils Relative: 1 %
Eosinophils Absolute: 0.5 10*3/uL (ref 0.0–0.5)
Eosinophils Relative: 7 %
HCT: 31.4 % — ABNORMAL LOW (ref 39.0–52.0)
Hemoglobin: 9.9 g/dL — ABNORMAL LOW (ref 13.0–17.0)
Immature Granulocytes: 0 %
Lymphocytes Relative: 20 %
Lymphs Abs: 1.5 10*3/uL (ref 0.7–4.0)
MCH: 28 pg (ref 26.0–34.0)
MCHC: 31.5 g/dL (ref 30.0–36.0)
MCV: 88.7 fL (ref 80.0–100.0)
Monocytes Absolute: 0.5 10*3/uL (ref 0.1–1.0)
Monocytes Relative: 8 %
Neutro Abs: 4.6 10*3/uL (ref 1.7–7.7)
Neutrophils Relative %: 64 %
Platelet Count: 269 10*3/uL (ref 150–400)
RBC: 3.54 MIL/uL — ABNORMAL LOW (ref 4.22–5.81)
RDW: 15.6 % — ABNORMAL HIGH (ref 11.5–15.5)
WBC Count: 7.2 10*3/uL (ref 4.0–10.5)
nRBC: 0 % (ref 0.0–0.2)

## 2019-04-11 NOTE — Progress Notes (Signed)
[facility] Telephone:(336) [phone]   Fax:(336) [phone]  CONSULT NOTE  REFERRING PHYSICIAN: Dr. Calvert Cantor  REASON FOR CONSULTATION:  85 years old African-American male with iron deficiency anemia  HPI Stephen Chavez is a 85 y.o. male with past medical history significant for mild carotid stenosis, hypertension, dyslipidemia, congestive heart failure, history of pulmonary embolism in January 2020, chronic kidney disease as well as gastrointestinal AV malformation.  The patient was on treatment with Eliquis for the pulmonary embolus.  He has been complaining of increasing fatigue and weakness over the last few months.  He was admitted to the hospital with severe anemia.  He underwent small bowel endoscopy that showed erosive gastropathy treated with argon plasma coagulation.  There was also 3 nonbleeding angiectasia's in the duodenum that were also treated with the same procedure.  He also had a colonoscopy that showed few small polyps and the final pathology was consistent with tubular adenoma.  His hemoglobin on admission was 5.6.  Iron studies showed serum iron of 21 with iron saturation of 6% and ferritin level of 8.  The patient received 2 units of PRBCs transfusion as well as 1 iron infusion with Feraheme.  Repeat CBC on 04/01/2019 before discharge showed hemoglobin of 8.5 and hematocrit 26.4%. The patient was referred to me today for evaluation and recommendation regarding his iron deficiency anemia. When seen today he is feeling much better and has better appetite.  He denied having any current chest pain, shortness of breath, cough or hemoptysis.  He denied having any dizzy spells.  He has no nausea, vomiting, diarrhea or constipation and no craving for ice.  He denied having any recent weight loss or night sweats.  He has no headache or visual changes.  His treatment with Eliquis was discontinued 2 weeks ago. Family history significant for mother with hypertension.  Father  is unknown. The patient is married and has 9 children and 3 stepchildren.  He used to work as a [employer].  He has a history for smoking but quit 20 years ago.  He drinks alcohol occasionally and no history of drug abuse.  HPI  Past Medical History:  Diagnosis Date  . Amaurosis fugax of right eye 09/03/2016  . Carotid stenosis 09/03/2016   Mild 05/2016  . Chronic combined systolic and diastolic heart failure (HCC) 10/23/2016  . Essential hypertension 09/03/2016  . Hyperlipidemia   . Hypertension     Past Surgical History:  Procedure Laterality Date  . BIOPSY  04/01/2019   Procedure: BIOPSY;  Surgeon: Kathi Der, MD;  Location: [facility];  Service: Gastroenterology;;  . COLONOSCOPY WITH PROPOFOL N/A 04/01/2019   Procedure: COLONOSCOPY WITH PROPOFOL;  Surgeon: Kathi Der, MD;  Location: [facility];  Service: Gastroenterology;  Laterality: N/A;  . ENTEROSCOPY N/A 04/01/2019   Procedure: Push ENTEROSCOPY;  Surgeon: Kathi Der, MD;  Location: [facility];  Service: Gastroenterology;  Laterality: N/A;  . EYE SURGERY    . HOT HEMOSTASIS N/A 04/01/2019   Procedure: HOT HEMOSTASIS (ARGON PLASMA COAGULATION/BICAP);  Surgeon: Kathi Der, MD;  Location: [facility] ENDOSCOPY;  Service: Gastroenterology;  Laterality: N/A;  . polyp removal    . POLYPECTOMY  04/01/2019   Procedure: POLYPECTOMY;  Surgeon: Kathi Der, MD;  Location: [facility];  Service: Gastroenterology;;    Family History  Problem Relation Age of Onset  . Hypertension Mother     Social History Social History   Tobacco Use  . Smoking status: Never Smoker  . Smokeless  tobacco: Never Used  Substance Use Topics  . Alcohol use: Yes    Alcohol/week: 4.0 standard drinks    Types: 4 Standard drinks or equivalent per week  . Drug use: No    No Known Allergies  Current Outpatient Medications  Medication Sig Dispense Refill  . acetaminophen (TYLENOL) 160 MG/5ML suspension Take 240 mg by  mouth every 6 (six) hours as needed for mild pain or headache.    . albuterol (PROVENTIL HFA;VENTOLIN HFA) 108 (90 Base) MCG/ACT inhaler Inhale 1-2 puffs into the lungs every 6 (six) hours as needed for wheezing or shortness of breath.    . allopurinol (ZYLOPRIM) 100 MG tablet Take 100 mg by mouth daily.    Marland Kitchen. amLODipine (NORVASC) 5 MG tablet Take 1.5 tablets daily (Patient taking differently: Take 5 mg by mouth at bedtime. ) 45 tablet 0  . ferrous sulfate 325 (65 FE) MG tablet Take 325 mg by mouth daily with breakfast.    . fluticasone (FLONASE) 50 MCG/ACT nasal spray Place 2 sprays into both nostrils daily as needed for allergies or rhinitis.    Marland Kitchen. loratadine (CLARITIN) 10 MG tablet Take 10 mg by mouth at bedtime as needed for allergies or rhinitis.     . Multiple Vitamins-Minerals (CENTRUM SILVER) CHEW Chew 2 tablets by mouth daily.    . pantoprazole (PROTONIX) 40 MG tablet Take 1 tablet (40 mg total) by mouth daily. 30 tablet 1  . pravastatin (PRAVACHOL) 80 MG tablet Take 80 mg by mouth every evening.     . Psyllium (METAMUCIL FIBER PO) Take by mouth See admin instructions. Mix 1 tablespoonful of powder into 8 ounces of water or juice and drink once a day    . senna (SENOKOT) 8.6 MG TABS tablet Take 1 tablet by mouth daily.     No current facility-administered medications for this visit.     Review of Systems  Constitutional: positive for fatigue Eyes: negative Ears, nose, mouth, throat, and face: negative Respiratory: negative Cardiovascular: negative Gastrointestinal: negative Genitourinary:negative Integument/breast: negative Hematologic/lymphatic: negative Musculoskeletal:negative Neurological: negative Behavioral/Psych: negative Endocrine: negative Allergic/Immunologic: negative  Physical Exam  ONG:EXBMWRAL:alert, healthy, no distress, well nourished and well developed SKIN: skin color, texture, turgor are normal, no rashes or significant lesions HEAD: Normocephalic, No masses,  lesions, tenderness or abnormalities EYES: normal, PERRLA, Conjunctiva are pink and non-injected EARS: External ears normal, Canals clear OROPHARYNX:no exudate, no erythema and lips, buccal mucosa, and tongue normal  NECK: supple, no adenopathy, no JVD LYMPH:  no palpable lymphadenopathy, no hepatosplenomegaly LUNGS: clear to auscultation , and palpation HEART: regular rate & rhythm, no murmurs and no gallops ABDOMEN:abdomen soft, non-tender, normal bowel sounds and no masses or organomegaly BACK: No CVA tenderness, Range of motion is normal EXTREMITIES:no joint deformities, effusion, or inflammation, no edema  NEURO: alert & oriented x 3 with fluent speech, no focal motor/sensory deficits  PERFORMANCE STATUS: ECOG 1  LABORATORY DATA: Lab Results  Component Value Date   WBC 6.8 04/01/2019   HGB 8.5 (L) 04/01/2019   HCT 26.4 (L) 04/01/2019   MCV 87.7 04/01/2019   PLT 276 04/01/2019      Chemistry      Component Value Date/Time   NA 140 04/01/2019 0715   K 4.3 04/01/2019 0715   CL 109 04/01/2019 0715   CO2 21 (L) 04/01/2019 0715   BUN 15 04/01/2019 0715   CREATININE 1.89 (H) 04/01/2019 0715      Component Value Date/Time   CALCIUM 8.9 04/01/2019 0715  ALKPHOS 49 03/30/2019 1704   AST 16 03/30/2019 1704   ALT 19 03/30/2019 1704   BILITOT 0.7 03/30/2019 1704       RADIOGRAPHIC STUDIES: No results found.  ASSESSMENT: This is a very pleasant 85 years old African-American male with history of iron deficiency anemia secondary to gastrointestinal blood loss from AV malformation and gastropathy.  He was also on treatment with Eliquis which was discontinued.   PLAN: I had a lengthy discussion with the patient today about his current condition and further investigation of his condition as well as treatment options. I recommended for the patient to have repeat CBC today. I will also consider the patient for iron infusion with Ferrlecit for the next 4 weeks. I will arrange  for the patient a follow-up appointment with me in 2 months for evaluation with repeat CBC, iron study and ferritin but he was also advised to call immediately if he has any concerning symptoms in the interval. For hypertension, dyslipidemia and congestive heart failure he will continue his current treatment by his primary care physician. The patient voices understanding of current disease status and treatment options and is in agreement with the current care plan.  All questions were answered. The patient knows to call the clinic with any problems, questions or concerns. We can certainly see the patient much sooner if necessary.  Thank you so much for allowing me to participate in the care of CELESTINO ACKERMAN. I will continue to follow up the patient with you and assist in his care.  I spent 40 minutes counseling the patient face to face. The total time spent in the appointment was 60 minutes.  Disclaimer: This note was dictated with voice recognition software. Similar sounding words can inadvertently be transcribed and may not be corrected upon review.   Eilleen Kempf April 11, 2019, 11:37 AM

## 2019-04-15 ENCOUNTER — Ambulatory Visit: Payer: Medicare Other

## 2019-04-15 ENCOUNTER — Other Ambulatory Visit: Payer: Self-pay

## 2019-04-15 ENCOUNTER — Inpatient Hospital Stay: Payer: Medicare Other

## 2019-04-15 VITALS — BP 134/72 | HR 70 | Temp 98.6°F | Resp 16

## 2019-04-15 DIAGNOSIS — D509 Iron deficiency anemia, unspecified: Secondary | ICD-10-CM

## 2019-04-15 DIAGNOSIS — D508 Other iron deficiency anemias: Secondary | ICD-10-CM | POA: Diagnosis not present

## 2019-04-15 MED ORDER — LORATADINE 10 MG PO TABS
ORAL_TABLET | ORAL | Status: AC
  Filled 2019-04-15: qty 1

## 2019-04-15 MED ORDER — SODIUM CHLORIDE 0.9 % IV SOLN
125.0000 mg | Freq: Once | INTRAVENOUS | Status: AC
  Administered 2019-04-15: 125 mg via INTRAVENOUS
  Filled 2019-04-15: qty 10

## 2019-04-15 MED ORDER — ACETAMINOPHEN 325 MG PO TABS
ORAL_TABLET | ORAL | Status: AC
  Filled 2019-04-15: qty 2

## 2019-04-15 MED ORDER — LORATADINE 10 MG PO TABS
10.0000 mg | ORAL_TABLET | Freq: Once | ORAL | Status: AC
  Administered 2019-04-15: 10 mg via ORAL

## 2019-04-15 MED ORDER — ACETAMINOPHEN 325 MG PO TABS
650.0000 mg | ORAL_TABLET | Freq: Once | ORAL | Status: AC
  Administered 2019-04-15: 650 mg via ORAL

## 2019-04-15 MED ORDER — SODIUM CHLORIDE 0.9 % IV SOLN
Freq: Once | INTRAVENOUS | Status: AC
  Administered 2019-04-15: 09:00:00 via INTRAVENOUS
  Filled 2019-04-15: qty 250

## 2019-04-15 NOTE — Patient Instructions (Signed)
Sodium Ferric Gluconate Complex injection What is this medicine? SODIUM FERRIC GLUCONATE COMPLEX (SOE dee um FER ik GLOO koe nate KOM pleks) is an iron replacement. It is used with epoetin therapy to treat low iron levels in patients who are receiving hemodialysis. This medicine may be used for other purposes; ask your health care provider or pharmacist if you have questions. COMMON BRAND NAME(S): Ferrlecit, Nulecit What should I tell my health care provider before I take this medicine? They need to know if you have any of the following conditions:  anemia that is not from iron deficiency  high levels of iron in the body  an unusual or allergic reaction to iron, benzyl alcohol, other medicines, foods, dyes, or preservatives  pregnant or are trying to become pregnant  breast-feeding How should I use this medicine? This medicine is for infusion into a vein. It is given by a health care professional in a hospital or clinic setting. Talk to your pediatrician regarding the use of this medicine in children. While this drug may be prescribed for children as young as 6 years old for selected conditions, precautions do apply. Overdosage: If you think you have taken too much of this medicine contact a poison control center or emergency room at once. NOTE: This medicine is only for you. Do not share this medicine with others. What if I miss a dose? It is important not to miss your dose. Call your doctor or health care professional if you are unable to keep an appointment. What may interact with this medicine? Do not take this medicine with any of the following medications:  deferoxamine  dimercaprol  other iron products This medicine may also interact with the following medications:  chloramphenicol  deferasirox  medicine for blood pressure like enalapril This list may not describe all possible interactions. Give your health care provider a list of all the medicines, herbs,  non-prescription drugs, or dietary supplements you use. Also tell them if you smoke, drink alcohol, or use illegal drugs. Some items may interact with your medicine. What should I watch for while using this medicine? Your condition will be monitored carefully while you are receiving this medicine. Visit your doctor for check-ups as directed. What side effects may I notice from receiving this medicine? Side effects that you should report to your doctor or health care professional as soon as possible:  allergic reactions like skin rash, itching or hives, swelling of the face, lips, or tongue  breathing problems  changes in hearing  changes in vision  chills, flushing, or sweating  fast, irregular heartbeat  feeling faint or lightheaded, falls  fever, flu-like symptoms  high or low blood pressure  pain, tingling, numbness in the hands or feet  severe pain in the chest, back, flanks, or groin  swelling of the ankles, feet, hands  trouble passing urine or change in the amount of urine  unusually weak or tired Side effects that usually do not require medical attention (report to your doctor or health care professional if they continue or are bothersome):  cramps  dark colored stools  diarrhea  headache  nausea, vomiting  stomach upset This list may not describe all possible side effects. Call your doctor for medical advice about side effects. You may report side effects to FDA at 1-800-FDA-1088. Where should I keep my medicine? This drug is given in a hospital or clinic and will not be stored at home. NOTE: This sheet is a summary. It may not cover all   possible information. If you have questions about this medicine, talk to your doctor, pharmacist, or health care provider.  2020 Elsevier/Gold Standard (2008-02-02 15:58:57)  

## 2019-04-18 ENCOUNTER — Telehealth: Payer: Self-pay | Admitting: *Deleted

## 2019-04-18 ENCOUNTER — Ambulatory Visit: Payer: Medicare Other

## 2019-04-18 NOTE — Telephone Encounter (Signed)
Pt called for clarification " Do I need to take the OTC iron tablet since I'm taking the iron infusions?" Pt wife also asked " what is causing his iron to be low and what is the name of whats wrong with him?" Discussed with pt and wife I will review these concerns with MD and call back with additional information.

## 2019-04-22 ENCOUNTER — Inpatient Hospital Stay: Payer: Medicare Other | Attending: Internal Medicine

## 2019-04-22 ENCOUNTER — Other Ambulatory Visit: Payer: Self-pay

## 2019-04-22 VITALS — BP 129/69 | HR 65 | Temp 97.9°F | Resp 16

## 2019-04-22 DIAGNOSIS — D508 Other iron deficiency anemias: Secondary | ICD-10-CM | POA: Diagnosis not present

## 2019-04-22 DIAGNOSIS — D509 Iron deficiency anemia, unspecified: Secondary | ICD-10-CM

## 2019-04-22 MED ORDER — ACETAMINOPHEN 325 MG PO TABS
650.0000 mg | ORAL_TABLET | Freq: Once | ORAL | Status: AC
  Administered 2019-04-22: 650 mg via ORAL

## 2019-04-22 MED ORDER — LORATADINE 10 MG PO TABS
10.0000 mg | ORAL_TABLET | Freq: Once | ORAL | Status: AC
  Administered 2019-04-22: 10 mg via ORAL

## 2019-04-22 MED ORDER — SODIUM CHLORIDE 0.9 % IV SOLN
Freq: Once | INTRAVENOUS | Status: AC
  Administered 2019-04-22: 09:00:00 via INTRAVENOUS
  Filled 2019-04-22: qty 250

## 2019-04-22 MED ORDER — LORATADINE 10 MG PO TABS
ORAL_TABLET | ORAL | Status: AC
  Filled 2019-04-22: qty 1

## 2019-04-22 MED ORDER — ACETAMINOPHEN 325 MG PO TABS
ORAL_TABLET | ORAL | Status: AC
  Filled 2019-04-22: qty 2

## 2019-04-22 MED ORDER — SODIUM CHLORIDE 0.9 % IV SOLN
125.0000 mg | Freq: Once | INTRAVENOUS | Status: AC
  Administered 2019-04-22: 10:00:00 125 mg via INTRAVENOUS
  Filled 2019-04-22: qty 10

## 2019-04-22 NOTE — Patient Instructions (Signed)
Sodium Ferric Gluconate Complex injection What is this medicine? SODIUM FERRIC GLUCONATE COMPLEX (SOE dee um FER ik GLOO koe nate KOM pleks) is an iron replacement. It is used with epoetin therapy to treat low iron levels in patients who are receiving hemodialysis. This medicine may be used for other purposes; ask your health care provider or pharmacist if you have questions. COMMON BRAND NAME(S): Ferrlecit, Nulecit What should I tell my health care provider before I take this medicine? They need to know if you have any of the following conditions:  anemia that is not from iron deficiency  high levels of iron in the body  an unusual or allergic reaction to iron, benzyl alcohol, other medicines, foods, dyes, or preservatives  pregnant or are trying to become pregnant  breast-feeding How should I use this medicine? This medicine is for infusion into a vein. It is given by a health care professional in a hospital or clinic setting. Talk to your pediatrician regarding the use of this medicine in children. While this drug may be prescribed for children as young as 6 years old for selected conditions, precautions do apply. Overdosage: If you think you have taken too much of this medicine contact a poison control center or emergency room at once. NOTE: This medicine is only for you. Do not share this medicine with others. What if I miss a dose? It is important not to miss your dose. Call your doctor or health care professional if you are unable to keep an appointment. What may interact with this medicine? Do not take this medicine with any of the following medications:  deferoxamine  dimercaprol  other iron products This medicine may also interact with the following medications:  chloramphenicol  deferasirox  medicine for blood pressure like enalapril This list may not describe all possible interactions. Give your health care provider a list of all the medicines, herbs,  non-prescription drugs, or dietary supplements you use. Also tell them if you smoke, drink alcohol, or use illegal drugs. Some items may interact with your medicine. What should I watch for while using this medicine? Your condition will be monitored carefully while you are receiving this medicine. Visit your doctor for check-ups as directed. What side effects may I notice from receiving this medicine? Side effects that you should report to your doctor or health care professional as soon as possible:  allergic reactions like skin rash, itching or hives, swelling of the face, lips, or tongue  breathing problems  changes in hearing  changes in vision  chills, flushing, or sweating  fast, irregular heartbeat  feeling faint or lightheaded, falls  fever, flu-like symptoms  high or low blood pressure  pain, tingling, numbness in the hands or feet  severe pain in the chest, back, flanks, or groin  swelling of the ankles, feet, hands  trouble passing urine or change in the amount of urine  unusually weak or tired Side effects that usually do not require medical attention (report to your doctor or health care professional if they continue or are bothersome):  cramps  dark colored stools  diarrhea  headache  nausea, vomiting  stomach upset This list may not describe all possible side effects. Call your doctor for medical advice about side effects. You may report side effects to FDA at 1-800-FDA-1088. Where should I keep my medicine? This drug is given in a hospital or clinic and will not be stored at home. NOTE: This sheet is a summary. It may not cover all   possible information. If you have questions about this medicine, talk to your doctor, pharmacist, or health care provider.  2020 Elsevier/Gold Standard (2008-02-02 15:58:57)  

## 2019-04-29 ENCOUNTER — Inpatient Hospital Stay: Payer: Medicare Other

## 2019-04-29 ENCOUNTER — Other Ambulatory Visit: Payer: Self-pay

## 2019-04-29 VITALS — BP 136/62 | HR 63 | Temp 98.3°F | Resp 17

## 2019-04-29 DIAGNOSIS — D508 Other iron deficiency anemias: Secondary | ICD-10-CM | POA: Diagnosis not present

## 2019-04-29 DIAGNOSIS — D509 Iron deficiency anemia, unspecified: Secondary | ICD-10-CM

## 2019-04-29 MED ORDER — ACETAMINOPHEN 325 MG PO TABS
ORAL_TABLET | ORAL | Status: AC
  Filled 2019-04-29: qty 2

## 2019-04-29 MED ORDER — LORATADINE 10 MG PO TABS
10.0000 mg | ORAL_TABLET | Freq: Once | ORAL | Status: AC
  Administered 2019-04-29: 10 mg via ORAL

## 2019-04-29 MED ORDER — LORATADINE 10 MG PO TABS
ORAL_TABLET | ORAL | Status: AC
  Filled 2019-04-29: qty 1

## 2019-04-29 MED ORDER — SODIUM CHLORIDE 0.9 % IV SOLN
125.0000 mg | Freq: Once | INTRAVENOUS | Status: AC
  Administered 2019-04-29: 11:00:00 125 mg via INTRAVENOUS
  Filled 2019-04-29: qty 10

## 2019-04-29 MED ORDER — ACETAMINOPHEN 325 MG PO TABS
650.0000 mg | ORAL_TABLET | Freq: Once | ORAL | Status: AC
  Administered 2019-04-29: 650 mg via ORAL

## 2019-04-29 MED ORDER — SODIUM CHLORIDE 0.9 % IV SOLN
Freq: Once | INTRAVENOUS | Status: AC
  Administered 2019-04-29: 10:00:00 via INTRAVENOUS
  Filled 2019-04-29: qty 250

## 2019-04-29 NOTE — Patient Instructions (Signed)

## 2019-05-06 ENCOUNTER — Other Ambulatory Visit: Payer: Self-pay

## 2019-05-06 ENCOUNTER — Inpatient Hospital Stay: Payer: Medicare Other

## 2019-05-06 VITALS — BP 134/70 | HR 60 | Temp 98.8°F | Resp 18

## 2019-05-06 DIAGNOSIS — D508 Other iron deficiency anemias: Secondary | ICD-10-CM | POA: Diagnosis not present

## 2019-05-06 DIAGNOSIS — D509 Iron deficiency anemia, unspecified: Secondary | ICD-10-CM

## 2019-05-06 MED ORDER — LORATADINE 10 MG PO TABS
ORAL_TABLET | ORAL | Status: AC
  Filled 2019-05-06: qty 1

## 2019-05-06 MED ORDER — ACETAMINOPHEN 325 MG PO TABS
ORAL_TABLET | ORAL | Status: AC
  Filled 2019-05-06: qty 2

## 2019-05-06 MED ORDER — LORATADINE 10 MG PO TABS
10.0000 mg | ORAL_TABLET | Freq: Once | ORAL | Status: AC
  Administered 2019-05-06: 10 mg via ORAL

## 2019-05-06 MED ORDER — ACETAMINOPHEN 325 MG PO TABS
650.0000 mg | ORAL_TABLET | Freq: Once | ORAL | Status: AC
  Administered 2019-05-06: 650 mg via ORAL

## 2019-05-06 MED ORDER — SODIUM CHLORIDE 0.9 % IV SOLN
Freq: Once | INTRAVENOUS | Status: AC
  Administered 2019-05-06: 10:00:00 via INTRAVENOUS
  Filled 2019-05-06: qty 250

## 2019-05-06 MED ORDER — SODIUM CHLORIDE 0.9 % IV SOLN
125.0000 mg | Freq: Once | INTRAVENOUS | Status: AC
  Administered 2019-05-06: 125 mg via INTRAVENOUS
  Filled 2019-05-06: qty 10

## 2019-05-06 NOTE — Patient Instructions (Addendum)
Sodium Ferric Gluconate Complex injection What is this medicine? SODIUM FERRIC GLUCONATE COMPLEX (SOE dee um FER ik GLOO koe nate KOM pleks) is an iron replacement. It is used with epoetin therapy to treat low iron levels in patients who are receiving hemodialysis. This medicine may be used for other purposes; ask your health care provider or pharmacist if you have questions. COMMON BRAND NAME(S): Ferrlecit, Nulecit What should I tell my health care provider before I take this medicine? They need to know if you have any of the following conditions:  anemia that is not from iron deficiency  high levels of iron in the body  an unusual or allergic reaction to iron, benzyl alcohol, other medicines, foods, dyes, or preservatives  pregnant or are trying to become pregnant  breast-feeding How should I use this medicine? This medicine is for infusion into a vein. It is given by a health care professional in a hospital or clinic setting. Talk to your pediatrician regarding the use of this medicine in children. While this drug may be prescribed for children as young as 6 years old for selected conditions, precautions do apply. Overdosage: If you think you have taken too much of this medicine contact a poison control center or emergency room at once. NOTE: This medicine is only for you. Do not share this medicine with others. What if I miss a dose? It is important not to miss your dose. Call your doctor or health care professional if you are unable to keep an appointment. What may interact with this medicine? Do not take this medicine with any of the following medications:  deferoxamine  dimercaprol  other iron products This medicine may also interact with the following medications:  chloramphenicol  deferasirox  medicine for blood pressure like enalapril This list may not describe all possible interactions. Give your health care provider a list of all the medicines, herbs,  non-prescription drugs, or dietary supplements you use. Also tell them if you smoke, drink alcohol, or use illegal drugs. Some items may interact with your medicine. What should I watch for while using this medicine? Your condition will be monitored carefully while you are receiving this medicine. Visit your doctor for check-ups as directed. What side effects may I notice from receiving this medicine? Side effects that you should report to your doctor or health care professional as soon as possible:  allergic reactions like skin rash, itching or hives, swelling of the face, lips, or tongue  breathing problems  changes in hearing  changes in vision  chills, flushing, or sweating  fast, irregular heartbeat  feeling faint or lightheaded, falls  fever, flu-like symptoms  high or low blood pressure  pain, tingling, numbness in the hands or feet  severe pain in the chest, back, flanks, or groin  swelling of the ankles, feet, hands  trouble passing urine or change in the amount of urine  unusually weak or tired Side effects that usually do not require medical attention (report to your doctor or health care professional if they continue or are bothersome):  cramps  dark colored stools  diarrhea  headache  nausea, vomiting  stomach upset This list may not describe all possible side effects. Call your doctor for medical advice about side effects. You may report side effects to FDA at 1-800-FDA-1088. Where should I keep my medicine? This drug is given in a hospital or clinic and will not be stored at home. NOTE: This sheet is a summary. It may not cover all   possible information. If you have questions about this medicine, talk to your doctor, pharmacist, or health care provider.  2020 Elsevier/Gold Standard (2008-02-02 15:58:57)  

## 2019-06-13 ENCOUNTER — Encounter: Payer: Self-pay | Admitting: Internal Medicine

## 2019-06-13 ENCOUNTER — Inpatient Hospital Stay (HOSPITAL_BASED_OUTPATIENT_CLINIC_OR_DEPARTMENT_OTHER): Payer: Medicare Other | Admitting: Internal Medicine

## 2019-06-13 ENCOUNTER — Inpatient Hospital Stay: Payer: Medicare Other | Attending: Internal Medicine

## 2019-06-13 ENCOUNTER — Other Ambulatory Visit: Payer: Self-pay

## 2019-06-13 VITALS — BP 150/83 | HR 70 | Temp 98.7°F | Resp 18 | Ht 73.0 in | Wt 173.2 lb

## 2019-06-13 DIAGNOSIS — E785 Hyperlipidemia, unspecified: Secondary | ICD-10-CM | POA: Insufficient documentation

## 2019-06-13 DIAGNOSIS — D500 Iron deficiency anemia secondary to blood loss (chronic): Secondary | ICD-10-CM | POA: Insufficient documentation

## 2019-06-13 DIAGNOSIS — K922 Gastrointestinal hemorrhage, unspecified: Secondary | ICD-10-CM | POA: Diagnosis not present

## 2019-06-13 DIAGNOSIS — D509 Iron deficiency anemia, unspecified: Secondary | ICD-10-CM

## 2019-06-13 DIAGNOSIS — I10 Essential (primary) hypertension: Secondary | ICD-10-CM | POA: Diagnosis not present

## 2019-06-13 DIAGNOSIS — Q2733 Arteriovenous malformation of digestive system vessel: Secondary | ICD-10-CM | POA: Insufficient documentation

## 2019-06-13 DIAGNOSIS — Z79899 Other long term (current) drug therapy: Secondary | ICD-10-CM | POA: Insufficient documentation

## 2019-06-13 LAB — CBC WITH DIFFERENTIAL (CANCER CENTER ONLY)
Abs Immature Granulocytes: 0.01 10*3/uL (ref 0.00–0.07)
Basophils Absolute: 0 10*3/uL (ref 0.0–0.1)
Basophils Relative: 0 %
Eosinophils Absolute: 0.5 10*3/uL (ref 0.0–0.5)
Eosinophils Relative: 8 %
HCT: 35.9 % — ABNORMAL LOW (ref 39.0–52.0)
Hemoglobin: 11.7 g/dL — ABNORMAL LOW (ref 13.0–17.0)
Immature Granulocytes: 0 %
Lymphocytes Relative: 30 %
Lymphs Abs: 2 10*3/uL (ref 0.7–4.0)
MCH: 27.6 pg (ref 26.0–34.0)
MCHC: 32.6 g/dL (ref 30.0–36.0)
MCV: 84.7 fL (ref 80.0–100.0)
Monocytes Absolute: 0.4 10*3/uL (ref 0.1–1.0)
Monocytes Relative: 7 %
Neutro Abs: 3.5 10*3/uL (ref 1.7–7.7)
Neutrophils Relative %: 55 %
Platelet Count: 200 10*3/uL (ref 150–400)
RBC: 4.24 MIL/uL (ref 4.22–5.81)
RDW: 15.9 % — ABNORMAL HIGH (ref 11.5–15.5)
WBC Count: 6.5 10*3/uL (ref 4.0–10.5)
nRBC: 0 % (ref 0.0–0.2)

## 2019-06-13 LAB — IRON AND TIBC
Iron: 66 ug/dL (ref 42–163)
Saturation Ratios: 26 % (ref 20–55)
TIBC: 258 ug/dL (ref 202–409)
UIBC: 191 ug/dL (ref 117–376)

## 2019-06-13 LAB — FERRITIN: Ferritin: 216 ng/mL (ref 24–336)

## 2019-06-13 NOTE — Progress Notes (Signed)
[facility] Telephone:(336) [phone]   Fax:(336) [phone]  OFFICE PROGRESS NOTE  [name], MD [address] [place] [address]  DIAGNOSIS: History of iron deficiency anemia secondary to gastrointestinal blood loss from AV malformation and gastropathy.  PRIOR THERAPY: Iron infusion with Ferrlecit weekly for 4 weeks started April 15, 2019  CURRENT THERAPY: Ferrous sulfate 325 mg p.o. daily.  INTERVAL HISTORY: Stephen Chavez 85 y.o. male returns to the clinic today for follow-up visit.  The patient is feeling fine today with no concerning complaints except for mild fatigue.  He tolerated the previous iron infusion fairly well.  He denied having any current chest pain, shortness of breath, cough or hemoptysis.  He denied having any fever or chills.  He has no nausea, vomiting, diarrhea but has occasional constipation with the oral iron.  He denied having any recent weight loss or night sweats.  He is here today for evaluation with repeat CBC, iron study and ferritin.  MEDICAL HISTORY: Past Medical History:  Diagnosis Date  . Amaurosis fugax of right eye 09/03/2016  . Carotid stenosis 09/03/2016   Mild 05/2016  . Chronic combined systolic and diastolic heart failure (HCC) 10/23/2016  . Essential hypertension 09/03/2016  . Hyperlipidemia   . Hypertension     ALLERGIES:  has No Known Allergies.  MEDICATIONS:  Current Outpatient Medications  Medication Sig Dispense Refill  . acetaminophen (TYLENOL) 160 MG/5ML suspension Take 240 mg by mouth every 6 (six) hours as needed for mild pain or headache.    . albuterol (PROVENTIL HFA;VENTOLIN HFA) 108 (90 Base) MCG/ACT inhaler Inhale 1-2 puffs into the lungs every 6 (six) hours as needed for wheezing or shortness of breath.    . allopurinol (ZYLOPRIM) 100 MG tablet Take 100 mg by mouth daily.    Marland Kitchen amLODipine (NORVASC) 5 MG tablet Take 1.5 tablets daily (Patient taking differently: Take 5 mg by mouth at bedtime. ) 45  tablet 0  . ferrous sulfate 325 (65 FE) MG tablet Take 325 mg by mouth daily with breakfast.    . fluticasone (FLONASE) 50 MCG/ACT nasal spray Place 2 sprays into both nostrils daily as needed for allergies or rhinitis.    Marland Kitchen loratadine (CLARITIN) 10 MG tablet Take 10 mg by mouth at bedtime as needed for allergies or rhinitis.     . Multiple Vitamins-Minerals (CENTRUM SILVER) CHEW Chew 2 tablets by mouth daily.    . pantoprazole (PROTONIX) 40 MG tablet Take 1 tablet (40 mg total) by mouth daily. 30 tablet 1  . pravastatin (PRAVACHOL) 80 MG tablet Take 80 mg by mouth every evening.     . Psyllium (METAMUCIL FIBER PO) Take by mouth See admin instructions. Mix 1 tablespoonful of powder into 8 ounces of water or juice and drink once a day    . senna (SENOKOT) 8.6 MG TABS tablet Take 1 tablet by mouth daily.     No current facility-administered medications for this visit.    SURGICAL HISTORY:  Past Surgical History:  Procedure Laterality Date  . BIOPSY  04/01/2019   Procedure: BIOPSY;  Surgeon: Kathi Der, MD;  Location: [facility];  Service: Gastroenterology;;  . COLONOSCOPY WITH PROPOFOL N/A 04/01/2019   Procedure: COLONOSCOPY WITH PROPOFOL;  Surgeon: Kathi Der, MD;  Location: [facility];  Service: Gastroenterology;  Laterality: N/A;  . ENTEROSCOPY N/A 04/01/2019   Procedure: Push ENTEROSCOPY;  Surgeon: Kathi Der, MD;  Location: [facility];  Service: Gastroenterology;  Laterality: N/A;  .  EYE SURGERY    . HOT HEMOSTASIS N/A 04/01/2019   Procedure: HOT HEMOSTASIS (ARGON PLASMA COAGULATION/BICAP);  Surgeon: Otis Brace, MD;  Location: [facility] ENDOSCOPY;  Service: Gastroenterology;  Laterality: N/A;  . polyp removal    . POLYPECTOMY  04/01/2019   Procedure: POLYPECTOMY;  Surgeon: Otis Brace, MD;  Location: [facility];  Service: Gastroenterology;;    REVIEW OF SYSTEMS:  A comprehensive review of systems was negative except for: Constitutional: positive for  fatigue   PHYSICAL EXAMINATION: General appearance: alert, cooperative, fatigued and no distress Head: Normocephalic, without obvious abnormality, atraumatic Neck: no adenopathy, no JVD, supple, symmetrical, trachea midline and thyroid not enlarged, symmetric, no tenderness/mass/nodules Lymph nodes: Cervical, supraclavicular, and axillary nodes normal. Resp: clear to auscultation bilaterally Back: symmetric, no curvature. ROM normal. No CVA tenderness. Cardio: regular rate and rhythm, S1, S2 normal, no murmur, click, rub or gallop GI: soft, non-tender; bowel sounds normal; no masses,  no organomegaly Extremities: extremities normal, atraumatic, no cyanosis or edema  ECOG PERFORMANCE STATUS: 1 - Symptomatic but completely ambulatory  Blood pressure (!) 150/83, pulse 70, temperature 98.7 F (37.1 C), temperature source Temporal, resp. rate 18, height 6\' 1"  (1.854 m), weight 173 lb 3.2 oz (78.6 kg), SpO2 98 %.  LABORATORY DATA: Lab Results  Component Value Date   WBC 6.5 06/13/2019   HGB 11.7 (L) 06/13/2019   HCT 35.9 (L) 06/13/2019   MCV 84.7 06/13/2019   PLT 200 06/13/2019      Chemistry      Component Value Date/Time   NA 140 04/01/2019 0715   K 4.3 04/01/2019 0715   CL 109 04/01/2019 0715   CO2 21 (L) 04/01/2019 0715   BUN 15 04/01/2019 0715   CREATININE 1.89 (H) 04/01/2019 0715      Component Value Date/Time   CALCIUM 8.9 04/01/2019 0715   ALKPHOS 49 03/30/2019 1704   AST 16 03/30/2019 1704   ALT 19 03/30/2019 1704   BILITOT 0.7 03/30/2019 1704       RADIOGRAPHIC STUDIES: No results found.  ASSESSMENT AND PLAN: This is a very pleasant 85 years old African-American male with history of iron deficiency anemia secondary to gastrointestinal blood loss from AV malformation. The patient was treated with intravenous iron infusion with Ferrlecit weekly for 4 weeks.  He tolerated the infusion fairly well.  He continues on the oral iron tablets once daily. The patient had  repeat CBC performed earlier today that showed improvement in his hemoglobin and hematocrit.  Iron study and ferritin are still pending. I recommended for the patient to continue on the oral iron tablets for now with vitamin C or orange juice. He was also advised to use stool softener at nighttime during his treatment with the oral iron tablets. I will see the patient back for follow-up visit in 3 months for evaluation with repeat CBC, iron study and ferritin. The patient voices understanding of current disease status and treatment options and is in agreement with the current care plan. All questions were answered. The patient knows to call the clinic with any problems, questions or concerns. We can certainly see the patient much sooner if necessary.  I spent 10 minutes counseling the patient face to face. The total time spent in the appointment was 15 minutes.  Disclaimer: This note was dictated with voice recognition software. Similar sounding words can inadvertently be transcribed and may not be corrected upon review.

## 2019-06-14 ENCOUNTER — Telehealth: Payer: Self-pay | Admitting: Internal Medicine

## 2019-06-14 NOTE — Telephone Encounter (Signed)
Scheduled per los. Called and spoke with patient. Confirmed appt 

## 2019-07-06 ENCOUNTER — Ambulatory Visit: Payer: Medicare Other | Attending: Internal Medicine

## 2019-07-06 DIAGNOSIS — Z23 Encounter for immunization: Secondary | ICD-10-CM | POA: Insufficient documentation

## 2019-07-06 NOTE — Progress Notes (Signed)
   Covid-19 Vaccination Clinic  Name:  Stephen Chavez    MRN: [id number] DOB: 01-24-1933  07/06/2019  Stephen Chavez was observed post Covid-19 immunization for 15 minutes without incidence. He was provided with Vaccine Information Sheet and instruction to access the V-Safe system.   Stephen Chavez was instructed to call 911 with any severe reactions post vaccine: Marland Kitchen Difficulty breathing  . Swelling of your face and throat  . A fast heartbeat  . A bad rash all over your body  . Dizziness and weakness    Immunizations Administered    Name Date Dose VIS Date Route   Pfizer COVID-19 Vaccine 07/06/2019  9:17 AM 0.3 mL 05/27/2019 Intramuscular   Manufacturer: [employer], [employer]   Lot: [id number]   NDC: [address]-4

## 2019-07-07 ENCOUNTER — Ambulatory Visit: Payer: Medicare Other

## 2019-07-25 ENCOUNTER — Ambulatory Visit: Payer: Medicare Other | Attending: Internal Medicine

## 2019-07-25 DIAGNOSIS — Z23 Encounter for immunization: Secondary | ICD-10-CM

## 2019-07-25 NOTE — Progress Notes (Signed)
   Covid-19 Vaccination Clinic  Name:  TRAVIN MARIK    MRN: [id number] DOB: 10-Jul-1932  07/25/2019  Mr. Sandy was observed post Covid-19 immunization for 15 minutes without incidence. He was provided with Vaccine Information Sheet and instruction to access the V-Safe system.   Mr. See was instructed to call 911 with any severe reactions post vaccine: Marland Kitchen Difficulty breathing  . Swelling of your face and throat  . A fast heartbeat  . A bad rash all over your body  . Dizziness and weakness    Immunizations Administered    Name Date Dose VIS Date Route   Pfizer COVID-19 Vaccine 07/25/2019  1:36 PM 0.3 mL 05/27/2019 Intramuscular   Manufacturer: [employer], [employer]   Lot: [id number]   NDC: [address]-9

## 2019-09-11 NOTE — Progress Notes (Signed)
[facility] OFFICE PROGRESS NOTE  [name], MD [address] [place] [address]  DIAGNOSIS: History ofiron deficiency anemia secondary to gastrointestinal blood loss from AV malformation and gastropathy.  PRIOR THERAPY: Iron infusion with Ferrlecit weekly for 4 weeks started April 15, 2019  CURRENT THERAPY: Ferrous sulfate 325 mg p.o. daily.  INTERVAL HISTORY: Stephen Chavez 86 y.o. male returns to the clinic for a follow up visit accompanied by his wife. The patient is feeling fairly well without any concerning complaints except for fatigue. He continues to take his p.o. oral iron supplement without any side effects. Per chart review, it appears his last IV iron infusion was in November 2020. Today, he denies any light headedness or dizziness. Denies any chest pain or palpitations. He reports his baseline dyspnea on exertion. Denies any known bleeding or bruising including epistaxis, gingival bleeding, hemoptysis, hematemesis, or hematuria. He reports persistent dark stools but he is unsure if this is attributed to his oral iron supplement vs. GI blood loss. He has a follow up appointment with Dr. Ewing Schlein, his gastroenterologist, for his history of GI blood loss next week on 09/19/19. He denies nausea, vomiting, or abdominal pain at this time. He is here today for evaluation and repeat blood work.   MEDICAL HISTORY: Past Medical History:  Diagnosis Date  . Amaurosis fugax of right eye 09/03/2016  . Carotid stenosis 09/03/2016   Mild 05/2016  . Chronic combined systolic and diastolic heart failure (HCC) 10/23/2016  . Essential hypertension 09/03/2016  . Hyperlipidemia   . Hypertension     ALLERGIES:  is allergic to allopurinol.  MEDICATIONS:  Current Outpatient Medications  Medication Sig Dispense Refill  . acetaminophen (TYLENOL) 160 MG/5ML suspension Take 240 mg by mouth every 6 (six) hours as needed for mild pain or headache.    Marland Kitchen amLODipine (NORVASC) 5 MG  tablet Take 1.5 tablets daily (Patient taking differently: Take 5 mg by mouth at bedtime. ) 45 tablet 0  . febuxostat (ULORIC) 40 MG tablet Take 40 mg by mouth daily.    . ferrous sulfate 325 (65 FE) MG tablet Take 325 mg by mouth daily with breakfast.    . fluticasone (FLONASE) 50 MCG/ACT nasal spray Place 2 sprays into both nostrils daily as needed for allergies or rhinitis.    Marland Kitchen loratadine (CLARITIN) 10 MG tablet Take 10 mg by mouth at bedtime as needed for allergies or rhinitis.     . Multiple Vitamins-Minerals (CENTRUM SILVER) CHEW Chew 2 tablets by mouth daily.    . pravastatin (PRAVACHOL) 80 MG tablet Take 80 mg by mouth every evening.     . Psyllium (METAMUCIL FIBER PO) Take by mouth See admin instructions. Mix 1 tablespoonful of powder into 8 ounces of water or juice and drink once a day    . senna (SENOKOT) 8.6 MG TABS tablet Take 1 tablet by mouth daily.    . traZODone (DESYREL) 50 MG tablet Take 150 mg by mouth at bedtime as needed.    . pantoprazole (PROTONIX) 40 MG tablet Take 1 tablet (40 mg total) by mouth daily. 30 tablet 1   No current facility-administered medications for this visit.    SURGICAL HISTORY:  Past Surgical History:  Procedure Laterality Date  . BIOPSY  04/01/2019   Procedure: BIOPSY;  Surgeon: Kathi Der, MD;  Location: [facility];  Service: Gastroenterology;;  . COLONOSCOPY WITH PROPOFOL N/A 04/01/2019   Procedure: COLONOSCOPY WITH PROPOFOL;  Surgeon: Kathi Der, MD;  Location:  [place];  Service: Gastroenterology;  Laterality: N/A;  . ENTEROSCOPY N/A 04/01/2019   Procedure: Push ENTEROSCOPY;  Surgeon: Otis Brace, MD;  Location: [place] [facility];  Service: Gastroenterology;  Laterality: N/A;  . EYE SURGERY    . HOT HEMOSTASIS N/A 04/01/2019   Procedure: HOT HEMOSTASIS (ARGON PLASMA COAGULATION/BICAP);  Surgeon: Otis Brace, MD;  Location: [facility];  Service: Gastroenterology;  Laterality: N/A;  . polyp removal    .  POLYPECTOMY  04/01/2019   Procedure: POLYPECTOMY;  Surgeon: Otis Brace, MD;  Location: [facility];  Service: Gastroenterology;;    REVIEW OF SYSTEMS:   Review of Systems  Constitutional: Positive for fatigue. Negative for appetite change, chills, fever and unexpected weight change.  HENT: Negative for mouth sores, nosebleeds, sore throat and trouble swallowing.   Eyes: Negative for eye problems and icterus.  Respiratory: Positive for baseline dyspnea on exertion. Negative for cough, hemoptysis, and wheezing.   Cardiovascular: Negative for chest pain and leg swelling.  Gastrointestinal: Positive for dark stools. Negative for abdominal pain, constipation, diarrhea, nausea and vomiting.  Genitourinary: Negative for bladder incontinence, difficulty urinating, dysuria, frequency and hematuria.   Musculoskeletal: Negative for back pain, gait problem, neck pain and neck stiffness.  Skin: Negative for itching and rash.  Neurological: Negative for dizziness, extremity weakness, gait problem, headaches, light-headedness and seizures.  Hematological: Negative for adenopathy. Does not bruise/bleed easily.  Psychiatric/Behavioral: Negative for confusion, depression and sleep disturbance. The patient is not nervous/anxious.     PHYSICAL EXAMINATION:  Blood pressure 120/78, pulse 77, temperature 98.5 F (36.9 C), temperature source Temporal, resp. rate 20, height 6\' 1"  (1.854 m), weight 168 lb 11.2 oz (76.5 kg), SpO2 100 %.  ECOG PERFORMANCE STATUS: 1 - Symptomatic but completely ambulatory  Physical Exam  Constitutional: Oriented to person, place, and time and well-developed, well-nourished, and in no distress. HENT:  Head: Normocephalic and atraumatic.  Mouth/Throat: Oropharynx is clear and moist. No oropharyngeal exudate.  Eyes: Conjunctivae are normal. Right eye exhibits no discharge. Left eye exhibits no discharge. No scleral icterus.  Neck: Normal range of motion. Neck supple.   Cardiovascular: Normal rate, regular rhythm, normal heart sounds and intact distal pulses.   Pulmonary/Chest: Effort normal and breath sounds normal. No respiratory distress. No wheezes. No rales.  Abdominal: Soft. Bowel sounds are normal. Exhibits no distension and no mass. There is no tenderness.  Musculoskeletal: Normal range of motion. Exhibits no edema.  Lymphadenopathy:    No cervical adenopathy.  Neurological: Alert and oriented to person, place, and time. Exhibits normal muscle tone. Gait normal. Coordination normal.  Skin: Skin is warm and dry. No rash noted. Not diaphoretic. No erythema. No pallor.  Psychiatric: Mood, memory and judgment normal.  Vitals reviewed.  LABORATORY DATA: Lab Results  Component Value Date   WBC 5.8 09/12/2019   HGB 10.3 (L) 09/12/2019   HCT 31.5 (L) 09/12/2019   MCV 86.5 09/12/2019   PLT 257 09/12/2019      Chemistry      Component Value Date/Time   NA 140 04/01/2019 0715   K 4.3 04/01/2019 0715   CL 109 04/01/2019 0715   CO2 21 (L) 04/01/2019 0715   BUN 15 04/01/2019 0715   CREATININE 1.89 (H) 04/01/2019 0715      Component Value Date/Time   CALCIUM 8.9 04/01/2019 0715   ALKPHOS 49 03/30/2019 1704   AST 16 03/30/2019 1704   ALT 19 03/30/2019 1704   BILITOT 0.7 03/30/2019 1704       RADIOGRAPHIC  STUDIES:  No results found.   ASSESSMENT/PLAN:  This is a very pleasant 86 year old African American male with iron deficiency anemia secondary to gastrointestinal blood loss from AV malformations.   She was treated with intravenous iron infusion with ferrlecit weekly for 4 weeks. He takes iron tablets p.o. daily.    Labs were reviewed. He had a repeat CBC, ferritin, and iron studies. His Hbg is 10.3. No indication for blood transfusion at this time. His iron studies are WNL. His ferritin is WNL.  Recommend that the patient continue on oral iron supplements for now with vitamin C or orange juice.   He was also advised to use stool  softener at nighttime during his treatment with the oral iron tablets.  We will see him back for a follow up visit in 3 months for evaluation and repeat CBC, iron studies, and ferritin.   Advised him to continue to follow with his gastroenterologist for management of his history of AVMs.    The patient was advised to call immediately if he has any concerning symptoms in the interval. The patient voices understanding of current disease status and treatment options and is in agreement with the current care plan. All questions were answered. The patient knows to call the clinic with any problems, questions or concerns. We can certainly see the patient much sooner if necessary     Orders Placed This Encounter  Procedures  . CBC with Differential (Cancer Center Only)    Standing Status:   Future    Standing Expiration Date:   09/11/2020  . Ferritin    Standing Status:   Future    Standing Expiration Date:   09/11/2020  . Iron and TIBC    Standing Status:   Future    Standing Expiration Date:   09/11/2020     Kennie Karapetian L Jolonda Gomm, PA-C 09/12/19

## 2019-09-12 ENCOUNTER — Inpatient Hospital Stay: Payer: Medicare Other | Attending: Physician Assistant | Admitting: Physician Assistant

## 2019-09-12 ENCOUNTER — Other Ambulatory Visit: Payer: Self-pay

## 2019-09-12 ENCOUNTER — Inpatient Hospital Stay: Payer: Medicare Other

## 2019-09-12 VITALS — BP 120/78 | HR 77 | Temp 98.5°F | Resp 20 | Ht 73.0 in | Wt 168.7 lb

## 2019-09-12 DIAGNOSIS — D500 Iron deficiency anemia secondary to blood loss (chronic): Secondary | ICD-10-CM | POA: Diagnosis not present

## 2019-09-12 DIAGNOSIS — I5042 Chronic combined systolic (congestive) and diastolic (congestive) heart failure: Secondary | ICD-10-CM | POA: Insufficient documentation

## 2019-09-12 DIAGNOSIS — E785 Hyperlipidemia, unspecified: Secondary | ICD-10-CM | POA: Insufficient documentation

## 2019-09-12 DIAGNOSIS — D509 Iron deficiency anemia, unspecified: Secondary | ICD-10-CM

## 2019-09-12 DIAGNOSIS — K319 Disease of stomach and duodenum, unspecified: Secondary | ICD-10-CM | POA: Insufficient documentation

## 2019-09-12 DIAGNOSIS — I110 Hypertensive heart disease with heart failure: Secondary | ICD-10-CM | POA: Diagnosis present

## 2019-09-12 DIAGNOSIS — Z79899 Other long term (current) drug therapy: Secondary | ICD-10-CM | POA: Insufficient documentation

## 2019-09-12 DIAGNOSIS — Q2733 Arteriovenous malformation of digestive system vessel: Secondary | ICD-10-CM | POA: Diagnosis not present

## 2019-09-12 LAB — CBC WITH DIFFERENTIAL (CANCER CENTER ONLY)
Abs Immature Granulocytes: 0.01 10*3/uL (ref 0.00–0.07)
Basophils Absolute: 0 10*3/uL (ref 0.0–0.1)
Basophils Relative: 0 %
Eosinophils Absolute: 0.3 10*3/uL (ref 0.0–0.5)
Eosinophils Relative: 5 %
HCT: 31.5 % — ABNORMAL LOW (ref 39.0–52.0)
Hemoglobin: 10.3 g/dL — ABNORMAL LOW (ref 13.0–17.0)
Immature Granulocytes: 0 %
Lymphocytes Relative: 24 %
Lymphs Abs: 1.4 10*3/uL (ref 0.7–4.0)
MCH: 28.3 pg (ref 26.0–34.0)
MCHC: 32.7 g/dL (ref 30.0–36.0)
MCV: 86.5 fL (ref 80.0–100.0)
Monocytes Absolute: 0.4 10*3/uL (ref 0.1–1.0)
Monocytes Relative: 6 %
Neutro Abs: 3.8 10*3/uL (ref 1.7–7.7)
Neutrophils Relative %: 65 %
Platelet Count: 257 10*3/uL (ref 150–400)
RBC: 3.64 MIL/uL — ABNORMAL LOW (ref 4.22–5.81)
RDW: 14.2 % (ref 11.5–15.5)
WBC Count: 5.8 10*3/uL (ref 4.0–10.5)
nRBC: 0 % (ref 0.0–0.2)

## 2019-09-12 LAB — IRON AND TIBC
Iron: 57 ug/dL (ref 42–163)
Saturation Ratios: 22 % (ref 20–55)
TIBC: 253 ug/dL (ref 202–409)
UIBC: 196 ug/dL (ref 117–376)

## 2019-09-12 LAB — FERRITIN: Ferritin: 146 ng/mL (ref 24–336)

## 2019-09-13 ENCOUNTER — Telehealth: Payer: Self-pay | Admitting: Physician Assistant

## 2019-09-13 NOTE — Telephone Encounter (Signed)
Scheduled per los. Called and spoke with patient. Confirmed appt 

## 2019-09-19 ENCOUNTER — Other Ambulatory Visit: Payer: Self-pay | Admitting: Family Medicine

## 2019-09-19 DIAGNOSIS — R9389 Abnormal findings on diagnostic imaging of other specified body structures: Secondary | ICD-10-CM

## 2019-09-20 ENCOUNTER — Ambulatory Visit (INDEPENDENT_AMBULATORY_CARE_PROVIDER_SITE_OTHER): Payer: Medicare Other | Admitting: Ophthalmology

## 2019-09-20 ENCOUNTER — Other Ambulatory Visit: Payer: Self-pay

## 2019-09-20 ENCOUNTER — Encounter (INDEPENDENT_AMBULATORY_CARE_PROVIDER_SITE_OTHER): Payer: Self-pay | Admitting: Ophthalmology

## 2019-09-20 DIAGNOSIS — H33102 Unspecified retinoschisis, left eye: Secondary | ICD-10-CM

## 2019-09-20 DIAGNOSIS — H43822 Vitreomacular adhesion, left eye: Secondary | ICD-10-CM | POA: Diagnosis not present

## 2019-09-20 DIAGNOSIS — H43821 Vitreomacular adhesion, right eye: Secondary | ICD-10-CM | POA: Diagnosis not present

## 2019-10-10 ENCOUNTER — Ambulatory Visit
Admission: RE | Admit: 2019-10-10 | Discharge: 2019-10-10 | Disposition: A | Discharge status: Home / Self Care | Payer: Medicare Other | Source: Ambulatory Visit | Attending: Family Medicine | Admitting: Family Medicine

## 2019-10-10 ENCOUNTER — Other Ambulatory Visit: Payer: Self-pay

## 2019-10-10 DIAGNOSIS — R9389 Abnormal findings on diagnostic imaging of other specified body structures: Secondary | ICD-10-CM

## 2019-10-10 MED ORDER — GADOBENATE DIMEGLUMINE 529 MG/ML IV SOLN
16.0000 mL | Freq: Once | INTRAVENOUS | Status: AC | PRN
  Administered 2019-10-10: 16 mL via INTRAVENOUS

## 2019-12-13 ENCOUNTER — Inpatient Hospital Stay: Payer: Medicare Other | Admitting: Internal Medicine

## 2019-12-13 ENCOUNTER — Inpatient Hospital Stay: Payer: Medicare Other

## 2019-12-13 ENCOUNTER — Other Ambulatory Visit: Payer: Self-pay | Admitting: Nephrology

## 2019-12-13 DIAGNOSIS — N1832 Chronic kidney disease, stage 3b: Secondary | ICD-10-CM

## 2019-12-15 ENCOUNTER — Telehealth: Payer: Self-pay | Admitting: Medical Oncology

## 2019-12-15 ENCOUNTER — Telehealth: Payer: Self-pay | Admitting: Internal Medicine

## 2019-12-15 ENCOUNTER — Ambulatory Visit
Admission: RE | Admit: 2019-12-15 | Discharge: 2019-12-15 | Disposition: A | Discharge status: Home / Self Care | Payer: Medicare Other | Source: Ambulatory Visit | Attending: Nephrology | Admitting: Nephrology

## 2019-12-15 DIAGNOSIS — N1832 Chronic kidney disease, stage 3b: Secondary | ICD-10-CM

## 2019-12-15 NOTE — Telephone Encounter (Signed)
Called pt per 6/30 sch message- no answer. Left message for patient to call back to reschedule appt.

## 2019-12-15 NOTE — Telephone Encounter (Signed)
'  Can I get my tooth pulled?" I told him it is okay to get your tooth pulled because his plts were  WNL on 09/12/19.

## 2019-12-15 NOTE — Telephone Encounter (Signed)
He missed his appointment with me this week.  He may need to reschedule the appointment and have his blood count checked.  Thank you.

## 2019-12-16 ENCOUNTER — Telehealth: Payer: Self-pay | Admitting: Internal Medicine

## 2019-12-16 ENCOUNTER — Other Ambulatory Visit: Payer: Self-pay

## 2019-12-16 ENCOUNTER — Inpatient Hospital Stay: Payer: Medicare Other | Attending: Internal Medicine

## 2019-12-16 DIAGNOSIS — D631 Anemia in chronic kidney disease: Secondary | ICD-10-CM | POA: Diagnosis not present

## 2019-12-16 DIAGNOSIS — Z79899 Other long term (current) drug therapy: Secondary | ICD-10-CM | POA: Diagnosis not present

## 2019-12-16 DIAGNOSIS — I110 Hypertensive heart disease with heart failure: Secondary | ICD-10-CM | POA: Diagnosis not present

## 2019-12-16 DIAGNOSIS — D500 Iron deficiency anemia secondary to blood loss (chronic): Secondary | ICD-10-CM | POA: Diagnosis not present

## 2019-12-16 DIAGNOSIS — N183 Chronic kidney disease, stage 3 unspecified: Secondary | ICD-10-CM | POA: Insufficient documentation

## 2019-12-16 DIAGNOSIS — I5042 Chronic combined systolic (congestive) and diastolic (congestive) heart failure: Secondary | ICD-10-CM | POA: Diagnosis not present

## 2019-12-16 DIAGNOSIS — E785 Hyperlipidemia, unspecified: Secondary | ICD-10-CM | POA: Insufficient documentation

## 2019-12-16 LAB — CBC WITH DIFFERENTIAL (CANCER CENTER ONLY)
Abs Immature Granulocytes: 0.02 10*3/uL (ref 0.00–0.07)
Basophils Absolute: 0 10*3/uL (ref 0.0–0.1)
Basophils Relative: 0 %
Eosinophils Absolute: 0.4 10*3/uL (ref 0.0–0.5)
Eosinophils Relative: 5 %
HCT: 34.5 % — ABNORMAL LOW (ref 39.0–52.0)
Hemoglobin: 11.3 g/dL — ABNORMAL LOW (ref 13.0–17.0)
Immature Granulocytes: 0 %
Lymphocytes Relative: 24 %
Lymphs Abs: 1.6 10*3/uL (ref 0.7–4.0)
MCH: 28 pg (ref 26.0–34.0)
MCHC: 32.8 g/dL (ref 30.0–36.0)
MCV: 85.4 fL (ref 80.0–100.0)
Monocytes Absolute: 0.4 10*3/uL (ref 0.1–1.0)
Monocytes Relative: 6 %
Neutro Abs: 4.3 10*3/uL (ref 1.7–7.7)
Neutrophils Relative %: 65 %
Platelet Count: 227 10*3/uL (ref 150–400)
RBC: 4.04 MIL/uL — ABNORMAL LOW (ref 4.22–5.81)
RDW: 13.9 % (ref 11.5–15.5)
WBC Count: 6.7 10*3/uL (ref 4.0–10.5)
nRBC: 0 % (ref 0.0–0.2)

## 2019-12-16 LAB — IRON AND TIBC
Iron: 42 ug/dL (ref 42–163)
Saturation Ratios: 13 % — ABNORMAL LOW (ref 20–55)
TIBC: 331 ug/dL (ref 202–409)
UIBC: 290 ug/dL (ref 117–376)

## 2019-12-16 LAB — FERRITIN: Ferritin: 63 ng/mL (ref 24–336)

## 2019-12-16 NOTE — Telephone Encounter (Signed)
R/s 7/12 appt per 7/2 sch message - unable to reach pt - left message with appt date and time

## 2019-12-21 ENCOUNTER — Other Ambulatory Visit: Payer: Self-pay | Admitting: Physician Assistant

## 2019-12-21 ENCOUNTER — Encounter: Payer: Self-pay | Admitting: Medical Oncology

## 2019-12-21 DIAGNOSIS — D509 Iron deficiency anemia, unspecified: Secondary | ICD-10-CM

## 2019-12-22 ENCOUNTER — Encounter: Payer: Self-pay | Admitting: Internal Medicine

## 2019-12-22 ENCOUNTER — Inpatient Hospital Stay: Payer: Medicare Other

## 2019-12-22 ENCOUNTER — Inpatient Hospital Stay: Payer: Medicare Other | Admitting: Internal Medicine

## 2019-12-22 ENCOUNTER — Other Ambulatory Visit: Payer: Self-pay

## 2019-12-22 VITALS — BP 147/79 | HR 74 | Temp 98.1°F | Resp 17 | Ht 73.0 in | Wt 165.6 lb

## 2019-12-22 DIAGNOSIS — D508 Other iron deficiency anemias: Secondary | ICD-10-CM

## 2019-12-22 DIAGNOSIS — N183 Chronic kidney disease, stage 3 unspecified: Secondary | ICD-10-CM | POA: Diagnosis not present

## 2019-12-22 DIAGNOSIS — I10 Essential (primary) hypertension: Secondary | ICD-10-CM | POA: Diagnosis not present

## 2019-12-22 DIAGNOSIS — D500 Iron deficiency anemia secondary to blood loss (chronic): Secondary | ICD-10-CM | POA: Diagnosis not present

## 2019-12-22 DIAGNOSIS — D649 Anemia, unspecified: Secondary | ICD-10-CM | POA: Diagnosis not present

## 2019-12-22 DIAGNOSIS — D509 Iron deficiency anemia, unspecified: Secondary | ICD-10-CM

## 2019-12-22 LAB — CBC WITH DIFFERENTIAL (CANCER CENTER ONLY)
Abs Immature Granulocytes: 0.02 10*3/uL (ref 0.00–0.07)
Basophils Absolute: 0 10*3/uL (ref 0.0–0.1)
Basophils Relative: 0 %
Eosinophils Absolute: 0.6 10*3/uL — ABNORMAL HIGH (ref 0.0–0.5)
Eosinophils Relative: 7 %
HCT: 34.8 % — ABNORMAL LOW (ref 39.0–52.0)
Hemoglobin: 11.5 g/dL — ABNORMAL LOW (ref 13.0–17.0)
Immature Granulocytes: 0 %
Lymphocytes Relative: 29 %
Lymphs Abs: 2.2 10*3/uL (ref 0.7–4.0)
MCH: 27.6 pg (ref 26.0–34.0)
MCHC: 33 g/dL (ref 30.0–36.0)
MCV: 83.7 fL (ref 80.0–100.0)
Monocytes Absolute: 0.5 10*3/uL (ref 0.1–1.0)
Monocytes Relative: 6 %
Neutro Abs: 4.4 10*3/uL (ref 1.7–7.7)
Neutrophils Relative %: 58 %
Platelet Count: 223 10*3/uL (ref 150–400)
RBC: 4.16 MIL/uL — ABNORMAL LOW (ref 4.22–5.81)
RDW: 14.5 % (ref 11.5–15.5)
WBC Count: 7.7 10*3/uL (ref 4.0–10.5)
nRBC: 0 % (ref 0.0–0.2)

## 2019-12-22 LAB — CMP (CANCER CENTER ONLY)
ALT: 17 U/L (ref 0–44)
AST: 13 U/L — ABNORMAL LOW (ref 15–41)
Albumin: 3.9 g/dL (ref 3.5–5.0)
Alkaline Phosphatase: 84 U/L (ref 38–126)
Anion gap: 10 (ref 5–15)
BUN: 20 mg/dL (ref 8–23)
CO2: 24 mmol/L (ref 22–32)
Calcium: 9.6 mg/dL (ref 8.9–10.3)
Chloride: 110 mmol/L (ref 98–111)
Creatinine: 1.94 mg/dL — ABNORMAL HIGH (ref 0.61–1.24)
GFR, Est AFR Am: 36 mL/min — ABNORMAL LOW (ref >60)
GFR, Estimated: 31 mL/min — ABNORMAL LOW (ref >60)
Glucose, Bld: 124 mg/dL — ABNORMAL HIGH (ref 70–99)
Potassium: 4.6 mmol/L (ref 3.5–5.1)
Sodium: 144 mmol/L (ref 135–145)
Total Bilirubin: 0.4 mg/dL (ref 0.3–1.2)
Total Protein: 7.3 g/dL (ref 6.5–8.1)

## 2019-12-22 NOTE — Progress Notes (Signed)
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  PRIOR THERAPY: Iron infusion with Ferrlecit weekly for 4 weeks started April 15, 2019  CURRENT THERAPY: Ferrous sulfate 325 mg p.o. daily.  INTERVAL HISTORY: Stephen Chavez 86 y.o. male returns to the clinic today for follow-up visit accompanied by his wife.  The patient has no concerning complaints today.  He denied having any fatigue or weakness.  He denied having any dizzy spells or shortness of breath.  He is currently on oral iron tablets and tolerating it fairly well.  He denied having any nausea, vomiting, abdominal pain, diarrhea or constipation.  He denied having any chest pain, shortness of breath, cough or hemoptysis.  He has no weight loss or night sweats.  Is here today for evaluation after repeating CBC, iron study and ferritin.  MEDICAL HISTORY: Past Medical History:  Diagnosis Date  . Amaurosis fugax of right eye 09/03/2016  . Carotid stenosis 09/03/2016   Mild 05/2016  . Chronic combined systolic and diastolic heart failure (HCC) 10/23/2016  . Essential hypertension 09/03/2016  . Hyperlipidemia   . Hypertension     ALLERGIES:  is allergic to allopurinol.  MEDICATIONS:  Current Outpatient Medications  Medication Sig Dispense Refill  . acetaminophen (TYLENOL) 160 MG/5ML suspension Take 240 mg by mouth every 6 (six) hours as needed for mild pain or headache.    Marland Kitchen amLODipine (NORVASC) 5 MG tablet Take 1.5 tablets daily (Patient taking differently: Take 5 mg by mouth at bedtime. ) 45 tablet 0  . febuxostat (ULORIC) 40 MG tablet Take 40 mg by mouth daily.    . ferrous sulfate 325 (65 FE) MG tablet Take 325 mg by mouth daily with breakfast.    . fluticasone (FLONASE) 50 MCG/ACT nasal spray  Place 2 sprays into both nostrils daily as needed for allergies or rhinitis.    Marland Kitchen loratadine (CLARITIN) 10 MG tablet Take 10 mg by mouth at bedtime as needed for allergies or rhinitis.     . Multiple Vitamins-Minerals (CENTRUM SILVER) CHEW Chew 2 tablets by mouth daily.    . pantoprazole (PROTONIX) 40 MG tablet Take 1 tablet (40 mg total) by mouth daily. 30 tablet 1  . pravastatin (PRAVACHOL) 80 MG tablet Take 80 mg by mouth every evening.     . Psyllium (METAMUCIL FIBER PO) Take by mouth See admin instructions. Mix 1 tablespoonful of powder into 8 ounces of water or juice and drink once a day    . senna (SENOKOT) 8.6 MG TABS tablet Take 1 tablet by mouth daily.    . traZODone (DESYREL) 50 MG tablet Take 150 mg by mouth at bedtime as needed.     No current facility-administered medications for this visit.    SURGICAL HISTORY:  Past Surgical History:  Procedure Laterality Date  . BIOPSY  04/01/2019   Procedure: BIOPSY;  Surgeon: Kathi Der, MD;  Location: [facility];  Service: Gastroenterology;;  . COLONOSCOPY WITH PROPOFOL N/A 04/01/2019   Procedure: COLONOSCOPY WITH PROPOFOL;  Surgeon: Kathi Der, MD;  Location: [facility];  Service: Gastroenterology;  Laterality: N/A;  . ENTEROSCOPY N/A 04/01/2019   Procedure: Push ENTEROSCOPY;  Surgeon: Kathi Der, MD;  Location: [facility];  Service: Gastroenterology;  Laterality: N/A;  . EYE SURGERY    . HOT  HEMOSTASIS N/A 04/01/2019   Procedure: HOT HEMOSTASIS (ARGON PLASMA COAGULATION/BICAP);  Surgeon: Kathi Der, MD;  Location: [facility];  Service: Gastroenterology;  Laterality: N/A;  . polyp removal    . POLYPECTOMY  04/01/2019   Procedure: POLYPECTOMY;  Surgeon: Kathi Der, MD;  Location: [facility];  Service: Gastroenterology;;    REVIEW OF SYSTEMS:  A comprehensive review of systems was negative.   PHYSICAL EXAMINATION: General appearance: alert, cooperative and no distress Head: Normocephalic,  without obvious abnormality, atraumatic Neck: no adenopathy, no JVD, supple, symmetrical, trachea midline and thyroid not enlarged, symmetric, no tenderness/mass/nodules Lymph nodes: Cervical, supraclavicular, and axillary nodes normal. Resp: clear to auscultation bilaterally Back: symmetric, no curvature. ROM normal. No CVA tenderness. Cardio: regular rate and rhythm, S1, S2 normal, no murmur, click, rub or gallop GI: soft, non-tender; bowel sounds normal; no masses,  no organomegaly Extremities: extremities normal, atraumatic, no cyanosis or edema  ECOG PERFORMANCE STATUS: 1 - Symptomatic but completely ambulatory  Blood pressure (!) 147/79, pulse 74, temperature 98.1 F (36.7 C), temperature source Temporal, resp. rate 17, height 6\' 1"  (1.854 m), weight 165 lb 9.6 oz (75.1 kg), SpO2 99 %.  LABORATORY DATA: Lab Results  Component Value Date   WBC 7.7 12/22/2019   HGB 11.5 (L) 12/22/2019   HCT 34.8 (L) 12/22/2019   MCV 83.7 12/22/2019   PLT 223 12/22/2019      Chemistry      Component Value Date/Time   NA 140 04/01/2019 0715   K 4.3 04/01/2019 0715   CL 109 04/01/2019 0715   CO2 21 (L) 04/01/2019 0715   BUN 15 04/01/2019 0715   CREATININE 1.89 (H) 04/01/2019 0715      Component Value Date/Time   CALCIUM 8.9 04/01/2019 0715   ALKPHOS 49 03/30/2019 1704   AST 16 03/30/2019 1704   ALT 19 03/30/2019 1704   BILITOT 0.7 03/30/2019 1704       RADIOGRAPHIC STUDIES: 04/01/2019 RENAL  Result Date: 12/15/2019 CLINICAL DATA:  86 year old male with stage 3 chronic kidney disease. EXAM: RENAL / URINARY TRACT ULTRASOUND COMPLETE COMPARISON:  Abdominal MRI dated 10/10/2019 FINDINGS: Right Kidney: Renal measurements: 9.3 x 3.9 x 4.5 cm = volume: 85 mL. There is parenchyma atrophy and increased echogenicity consistent with chronic kidney disease. There is a 3.3 x 3.8 x 3.6 cm lateral interpolar cyst with apparent internal septation and a 3.0 x 2.8 x 2.6 cm inferior pole cyst. No hydronephrosis  or shadowing stone. Left Kidney: Renal measurements: 10.1 x 5.5 x 5.2 cm = volume: 150 mL. Increased renal parenchymal echogenicity. No hydronephrosis or shadowing stone. There is a 1.8 x 1.9 x 2.2 cm interpolar cyst. A 2.5 x 2.2 x 2.4 cm hypoechoic structure in the lateral interpolar left kidney is suboptimally characterized but likely represents a cyst. Bladder: Appears normal for degree of bladder distention. Other: The prostate gland appears enlarged with median lobe hypertrophy indenting the base of the bladder. IMPRESSION: 1. Echogenic kidneys in keeping with chronic kidney disease. No hydronephrosis or shadowing stone. 2. Bilateral renal cysts better evaluated on the recent MRI. 3. Enlarged prostate gland. Electronically Signed   By: 10/12/2019 M.D.   On: 12/15/2019 19:41    ASSESSMENT AND PLAN: This is a very pleasant 86 years old African-American male with history of iron deficiency anemia secondary to gastrointestinal blood loss from AV malformation. The patient was treated with intravenous iron infusion with Ferrlecit weekly for 4 weeks.  He tolerated the infusion fairly well.  He  continues on the oral iron tablets once daily. The patient continues to do well with no concerning complaints. Repeat CBC showed mild anemia with hemoglobin of 11.5 but the iron study and ferritin are within the normal range. I recommended for the patient to continue the oral iron tablets.  His anemia is partially secondary to chronic renal insufficiency. I will see him back for follow-up visit in 6 months with repeat CBC, iron study and ferritin. He was advised to call immediately if he has any concerning symptoms in the interval. The patient voices understanding of current disease status and treatment options and is in agreement with the current care plan. All questions were answered. The patient knows to call the clinic with any problems, questions or concerns. We can certainly see the patient much sooner if  necessary.  Disclaimer: This note was dictated with voice recognition software. Similar sounding words can inadvertently be transcribed and may not be corrected upon review.

## 2019-12-26 ENCOUNTER — Other Ambulatory Visit: Payer: Medicare Other

## 2019-12-26 ENCOUNTER — Ambulatory Visit: Payer: Medicare Other | Admitting: Internal Medicine

## 2020-01-13 ENCOUNTER — Other Ambulatory Visit: Payer: Self-pay

## 2020-01-13 DIAGNOSIS — R6889 Other general symptoms and signs: Secondary | ICD-10-CM

## 2020-01-20 ENCOUNTER — Encounter (INDEPENDENT_AMBULATORY_CARE_PROVIDER_SITE_OTHER): Payer: Medicare Other | Admitting: Ophthalmology

## 2020-01-26 ENCOUNTER — Ambulatory Visit (HOSPITAL_COMMUNITY)
Admission: RE | Admit: 2020-01-26 | Discharge: 2020-01-26 | Disposition: A | Discharge status: Home / Self Care | Payer: Medicare Other | Source: Ambulatory Visit | Attending: Vascular Surgery | Admitting: Vascular Surgery

## 2020-01-26 ENCOUNTER — Ambulatory Visit: Payer: Medicare Other | Admitting: Physician Assistant

## 2020-01-26 ENCOUNTER — Encounter: Payer: Self-pay | Admitting: Physician Assistant

## 2020-01-26 ENCOUNTER — Other Ambulatory Visit: Payer: Self-pay

## 2020-01-26 VITALS — BP 138/71 | HR 63 | Temp 97.7°F | Resp 18 | Ht 71.0 in | Wt 165.0 lb

## 2020-01-26 DIAGNOSIS — R209 Unspecified disturbances of skin sensation: Secondary | ICD-10-CM | POA: Diagnosis not present

## 2020-01-26 DIAGNOSIS — R6889 Other general symptoms and signs: Secondary | ICD-10-CM | POA: Diagnosis not present

## 2020-01-26 NOTE — Progress Notes (Signed)
History of Present Illness:  Patient is a 86 y.o. year old male who presents for evaluation of B LE surveillance .  The patient currently describes a pins and needles  sensation in the both lower extremity. There is not rest pain.  There is no history of ulcerations on the feet.  Atherosclerotic risk factors and other medical problems include HTN, hyperlipidemia, and carotid stenosis.    Past Medical History:  Diagnosis Date  . Amaurosis fugax of right eye 09/03/2016  . Carotid stenosis 09/03/2016   Mild 05/2016  . Chronic combined systolic and diastolic heart failure (HCC) 10/23/2016  . Essential hypertension 09/03/2016  . Hyperlipidemia   . Hypertension     Past Surgical History:  Procedure Laterality Date  . BIOPSY  04/01/2019   Procedure: BIOPSY;  Surgeon: Kathi Der, MD;  Location: [facility];  Service: Gastroenterology;;  . COLONOSCOPY WITH PROPOFOL N/A 04/01/2019   Procedure: COLONOSCOPY WITH PROPOFOL;  Surgeon: Kathi Der, MD;  Location: [facility];  Service: Gastroenterology;  Laterality: N/A;  . ENTEROSCOPY N/A 04/01/2019   Procedure: Push ENTEROSCOPY;  Surgeon: Kathi Der, MD;  Location: [facility];  Service: Gastroenterology;  Laterality: N/A;  . EYE SURGERY    . HOT HEMOSTASIS N/A 04/01/2019   Procedure: HOT HEMOSTASIS (ARGON PLASMA COAGULATION/BICAP);  Surgeon: Kathi Der, MD;  Location: [facility]COPY;  Service: Gastroenterology;  Laterality: N/A;  . polyp removal    . POLYPECTOMY  04/01/2019   Procedure: POLYPECTOMY;  Surgeon: Kathi Der, MD;  [facility];  Service: Gastroenterology;;    ROS:   General:  No weight loss, Fever, chills  HEENT: No recent headaches, no nasal bleeding, no visual changes, no sore throat  Neurologic: No dizziness, blackouts, seizures. No recent symptoms of stroke or mini- stroke. No recent episodes of slurred speech, or temporary blindness.  Cardiac: No recent episodes of chest  pain/pressure, no shortness of breath at rest.  No shortness of breath with exertion.  Denies history of atrial fibrillation or irregular heartbeat  Vascular: No history of rest pain in feet.  No history of claudication.  No history of non-healing ulcer, No history of DVT   Pulmonary: No home oxygen, no productive cough, no hemoptysis,  No asthma or wheezing  Musculoskeletal:  [ ]  Arthritis, [ ]  Low back pain,  [ ]  Joint pain  Hematologic:No history of hypercoagulable state.  No history of easy bleeding.  No history of anemia  Gastrointestinal: No hematochezia or melena,  No gastroesophageal reflux, no trouble swallowing  Urinary: [x ] chronic Kidney disease, [ ]  on HD - [ ]  MWF or [ ]  TTHS, [ ]  Burning with urination, [ ]  Frequent urination, [ ]  Difficulty urinating;   Skin: No rashes  Psychological: No history of anxiety,  No history of depression  Social History Social History   Tobacco Use  . Smoking status: Never Smoker  . Smokeless tobacco: Never Used  Substance Use Topics  . Alcohol use: Yes    Alcohol/week: 4.0 standard drinks    Types: 4 Standard drinks or equivalent per week  . Drug use: No    Family History Family History  Problem Relation Age of Onset  . Hypertension Mother     Allergies  Allergies  Allergen Reactions  . Allopurinol Itching     Current Outpatient Medications  Medication Sig Dispense Refill  . acetaminophen (TYLENOL) 160 MG/5ML suspension Take 240 mg by mouth every 6 (six) hours as needed for mild pain or  headache.    Marland Kitchen amLODipine (NORVASC) 5 MG tablet Take 1.5 tablets daily (Patient taking differently: Take 5 mg by mouth at bedtime. ) 45 tablet 0  . febuxostat (ULORIC) 40 MG tablet Take 40 mg by mouth daily.    . ferrous sulfate 325 (65 FE) MG tablet Take 325 mg by mouth daily with breakfast.    . fluticasone (FLONASE) 50 MCG/ACT nasal spray Place 2 sprays into both nostrils daily as needed for allergies or rhinitis.    Marland Kitchen loratadine  (CLARITIN) 10 MG tablet Take 10 mg by mouth at bedtime as needed for allergies or rhinitis.     . Multiple Vitamins-Minerals (CENTRUM SILVER) CHEW Chew 2 tablets by mouth daily.    . pravastatin (PRAVACHOL) 80 MG tablet Take 80 mg by mouth every evening.     . Psyllium (METAMUCIL FIBER PO) Take by mouth See admin instructions. Mix 1 tablespoonful of powder into 8 ounces of water or juice and drink once a day    . senna (SENOKOT) 8.6 MG TABS tablet Take 1 tablet by mouth daily.    . traZODone (DESYREL) 50 MG tablet Take 150 mg by mouth at bedtime as needed.    . pantoprazole (PROTONIX) 40 MG tablet Take 1 tablet (40 mg total) by mouth daily. 30 tablet 1   No current facility-administered medications for this visit.    Physical Examination  Vitals:   01/26/20 1345  BP: 138/71  Pulse: 63  Resp: 18  Temp: 97.7 F (36.5 C)  TempSrc: Temporal  SpO2: 97%  Weight: 165 lb (74.8 kg)  Height: 5\' 11"  (1.803 m)    Body mass index is 23.01 kg/m.  General:  Alert and oriented, no acute distress HEENT: Normal Neck: No bruit or JVD Pulmonary: Clear to auscultation bilaterally Cardiac: Regular Rate and Rhythm without murmur Abdomen: Soft, non-tender, non-distended, no mass, no scars Skin: No rash Extremity Pulses:  2+ radial, brachial, femoral, popliteal, dorsalis pedis, posterior tibial pulses bilaterally Musculoskeletal: No deformity or edema  Neurologic: Upper and lower extremity motor 5/5 and symmetric  DATA:    ABI Findings:  +---------+------------------+-----+---------+--------+  Right  Rt Pressure (mmHg)IndexWaveform Comment   +---------+------------------+-----+---------+--------+  Brachial 148                      +---------+------------------+-----+---------+--------+  ATA   166        1.12 triphasic      +---------+------------------+-----+---------+--------+  PTA   167        1.13 triphasic       +---------+------------------+-----+---------+--------+         0.79 Normal        +---------+------------------+-----+---------+--------+   +---------+------------------+-----+---------+-------+  Left   Lt Pressure (mmHg)IndexWaveform Comment  +---------+------------------+-----+---------+-------+  Brachial 139                      +---------+------------------+-----+---------+-------+  ATA   177        1.20 triphasic      +---------+------------------+-----+---------+-------+  PTA   168        1.14 triphasic      +---------+------------------+-----+---------+-------+  Great Toe122        0.82 Normal        +---------+------------------+-----+---------+-------+       ASSESSMENT:  No evidence of PAD in B LE with normal ABI's Denise symptoms of claudication, non healing ulcers or rest pain.  He describes symptoms of pins and needles sometimes at night.  PLAN:  He has normal arterial flow to his toes with triphasic flow.  He is not at risk of limb loss.  He will f/u as needed in the future.     Mosetta Pigeon PA-C [facility] Office: [phone]  MD in clinic [place]

## 2020-06-22 ENCOUNTER — Other Ambulatory Visit: Payer: Self-pay

## 2020-06-22 ENCOUNTER — Inpatient Hospital Stay: Payer: Medicare Other | Attending: Internal Medicine

## 2020-06-22 DIAGNOSIS — I110 Hypertensive heart disease with heart failure: Secondary | ICD-10-CM | POA: Insufficient documentation

## 2020-06-22 DIAGNOSIS — D500 Iron deficiency anemia secondary to blood loss (chronic): Secondary | ICD-10-CM | POA: Diagnosis not present

## 2020-06-22 DIAGNOSIS — Q2733 Arteriovenous malformation of digestive system vessel: Secondary | ICD-10-CM | POA: Insufficient documentation

## 2020-06-22 DIAGNOSIS — E785 Hyperlipidemia, unspecified: Secondary | ICD-10-CM | POA: Diagnosis not present

## 2020-06-22 DIAGNOSIS — I5042 Chronic combined systolic (congestive) and diastolic (congestive) heart failure: Secondary | ICD-10-CM | POA: Insufficient documentation

## 2020-06-22 DIAGNOSIS — Z79899 Other long term (current) drug therapy: Secondary | ICD-10-CM | POA: Insufficient documentation

## 2020-06-22 LAB — CBC WITH DIFFERENTIAL (CANCER CENTER ONLY)
Abs Immature Granulocytes: 0.01 10*3/uL (ref 0.00–0.07)
Basophils Absolute: 0 10*3/uL (ref 0.0–0.1)
Basophils Relative: 1 %
Eosinophils Absolute: 0.4 10*3/uL (ref 0.0–0.5)
Eosinophils Relative: 6 %
HCT: 34.2 % — ABNORMAL LOW (ref 39.0–52.0)
Hemoglobin: 11.8 g/dL — ABNORMAL LOW (ref 13.0–17.0)
Immature Granulocytes: 0 %
Lymphocytes Relative: 33 %
Lymphs Abs: 2.1 10*3/uL (ref 0.7–4.0)
MCH: 28.2 pg (ref 26.0–34.0)
MCHC: 34.5 g/dL (ref 30.0–36.0)
MCV: 81.8 fL (ref 80.0–100.0)
Monocytes Absolute: 0.4 10*3/uL (ref 0.1–1.0)
Monocytes Relative: 7 %
Neutro Abs: 3.4 10*3/uL (ref 1.7–7.7)
Neutrophils Relative %: 53 %
Platelet Count: 239 10*3/uL (ref 150–400)
RBC: 4.18 MIL/uL — ABNORMAL LOW (ref 4.22–5.81)
RDW: 14.4 % (ref 11.5–15.5)
WBC Count: 6.3 10*3/uL (ref 4.0–10.5)
nRBC: 0 % (ref 0.0–0.2)

## 2020-06-22 LAB — FERRITIN: Ferritin: 57 ng/mL (ref 24–336)

## 2020-06-22 LAB — IRON AND TIBC
Iron: 59 ug/dL (ref 42–163)
Saturation Ratios: 18 % — ABNORMAL LOW (ref 20–55)
TIBC: 324 ug/dL (ref 202–409)
UIBC: 265 ug/dL (ref 117–376)

## 2020-06-25 ENCOUNTER — Other Ambulatory Visit: Payer: Self-pay

## 2020-06-25 ENCOUNTER — Inpatient Hospital Stay (HOSPITAL_BASED_OUTPATIENT_CLINIC_OR_DEPARTMENT_OTHER): Payer: Medicare Other | Admitting: Internal Medicine

## 2020-06-25 VITALS — BP 137/83 | HR 82 | Temp 97.1°F | Resp 19 | Ht 71.0 in | Wt 173.5 lb

## 2020-06-25 DIAGNOSIS — D500 Iron deficiency anemia secondary to blood loss (chronic): Secondary | ICD-10-CM

## 2020-06-25 NOTE — Progress Notes (Signed)
[facility] [phone]   [phone]  OFFICE PROGRESS NOTE  Stephen Gosselin, MD [address] 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  PRIOR THERAPY: Iron infusion with Ferrlecit weekly for 4 weeks started April 15, 2019  CURRENT THERAPY: Ferrous sulfate 325 mg p.o. daily.  INTERVAL HISTORY: Stephen Chavez 87 y.o. male returns to the clinic today for follow-up visit.  The patient is feeling fine today with no concerning complaints except for mild fatigue.  He continues to take the oral iron tablet but not on daily basis.  He denied having any current chest pain, shortness of breath, cough or hemoptysis.  He denied having any fever or chills.  He has no nausea, vomiting, diarrhea or constipation.  He has no headache or visual changes.  He is here today for evaluation with repeat CBC, iron study and ferritin.  MEDICAL HISTORY: Past Medical History:  Diagnosis Date  . Amaurosis fugax of right eye 09/03/2016  . Carotid stenosis 09/03/2016   Mild 05/2016  . Chronic combined systolic and diastolic heart failure (HCC) 10/23/2016  . Essential hypertension 09/03/2016  . Hyperlipidemia   . Hypertension     ALLERGIES:  is allergic to allopurinol.  MEDICATIONS:  Current Outpatient Medications  Medication Sig Dispense Refill  . acetaminophen (TYLENOL) 160 MG/5ML suspension Take 240 mg by mouth every 6 (six) hours as needed for mild pain or headache.    Marland Kitchen amLODipine (NORVASC) 5 MG tablet Take 1.5 tablets daily (Patient taking differently: Take 5 mg by mouth at bedtime. ) 45 tablet 0  . febuxostat (ULORIC) 40 MG tablet Take 40 mg by mouth daily.    . ferrous sulfate 325 (65 FE) MG tablet Take 325 mg by mouth daily with breakfast.    . fluticasone (FLONASE) 50 MCG/ACT nasal spray Place 2 sprays into both nostrils daily as needed for allergies or  rhinitis.    Marland Kitchen loratadine (CLARITIN) 10 MG tablet Take 10 mg by mouth at bedtime as needed for allergies or rhinitis.     . Multiple Vitamins-Minerals (CENTRUM SILVER) CHEW Chew 2 tablets by mouth daily.    . pantoprazole (PROTONIX) 40 MG tablet Take 1 tablet (40 mg total) by mouth daily. 30 tablet 1  . pravastatin (PRAVACHOL) 80 MG tablet Take 80 mg by mouth every evening.     . Psyllium (METAMUCIL FIBER PO) Take by mouth See admin instructions. Mix 1 tablespoonful of powder into 8 ounces of water or juice and drink once a day    . senna (SENOKOT) 8.6 MG TABS tablet Take 1 tablet by mouth daily.    . traZODone (DESYREL) 50 MG tablet Take 150 mg by mouth at bedtime as needed.     No current facility-administered medications for this visit.    SURGICAL HISTORY:  Past Surgical History:  Procedure Laterality Date  . BIOPSY  04/01/2019   Procedure: BIOPSY;  Surgeon: Kathi Der, MD;  [facility]: [facility];  Service: Gastroenterology;;  . COLONOSCOPY WITH PROPOFOL N/A 04/01/2019   Procedure: COLONOSCOPY WITH PROPOFOL;  Surgeon: Kathi Der, MD;  Location: [facility];  Service: Gastroenterology;  Laterality: N/A;  . ENTEROSCOPY N/A 04/01/2019   Procedure: Push ENTEROSCOPY;  Surgeon: Kathi Der, MD;  Location: [facility];  Service: Gastroenterology;  Laterality: N/A;  . EYE SURGERY    . HOT HEMOSTASIS N/A 04/01/2019   Procedure: HOT HEMOSTASIS (ARGON  PLASMA COAGULATION/BICAP);  Surgeon: Kathi Der, MD;  Location: [facility] ENDOSCOPY;  Service: Gastroenterology;  Laterality: N/A;  . polyp removal    . POLYPECTOMY  04/01/2019   Procedure: POLYPECTOMY;  Surgeon: Kathi Der, MD;  Location: [facility];  Service: Gastroenterology;;    REVIEW OF SYSTEMS:  A comprehensive review of systems was negative except for: Constitutional: positive for fatigue   PHYSICAL EXAMINATION: General appearance: alert, cooperative and no distress Head: Normocephalic, without obvious  abnormality, atraumatic Neck: no adenopathy, no JVD, supple, symmetrical, trachea midline and thyroid not enlarged, symmetric, no tenderness/mass/nodules Lymph nodes: Cervical, supraclavicular, and axillary nodes normal. Resp: clear to auscultation bilaterally Back: symmetric, no curvature. ROM normal. No CVA tenderness. Cardio: regular rate and rhythm, S1, S2 normal, no murmur, click, rub or gallop GI: soft, non-tender; bowel sounds normal; no masses,  no organomegaly Extremities: extremities normal, atraumatic, no cyanosis or edema  ECOG PERFORMANCE STATUS: 1 - Symptomatic but completely ambulatory  Blood pressure 137/83, pulse 82, temperature (!) 97.1 F (36.2 C), temperature source Tympanic, resp. rate 19, height 5\' 11"  (1.803 m), weight 173 lb 8 oz (78.7 kg), SpO2 98 %.  LABORATORY DATA: Lab Results  Component Value Date   WBC 6.3 06/22/2020   HGB 11.8 (L) 06/22/2020   HCT 34.2 (L) 06/22/2020   MCV 81.8 06/22/2020   PLT 239 06/22/2020      Chemistry      Component Value Date/Time   NA 144 12/22/2019 1122   K 4.6 12/22/2019 1122   CL 110 12/22/2019 1122   CO2 24 12/22/2019 1122   BUN 20 12/22/2019 1122   CREATININE 1.94 (H) 12/22/2019 1122      Component Value Date/Time   CALCIUM 9.6 12/22/2019 1122   ALKPHOS 84 12/22/2019 1122   AST 13 (L) 12/22/2019 1122   ALT 17 12/22/2019 1122   BILITOT 0.4 12/22/2019 1122       RADIOGRAPHIC STUDIES: No results found.  ASSESSMENT AND PLAN: This is a very pleasant 87 years old African-American male with history of iron deficiency anemia secondary to gastrointestinal blood loss from AV malformation. The patient was treated with intravenous iron infusion with Ferrlecit weekly for 4 weeks.  He tolerated the infusion fairly well.  The patient is currently on oral iron tablets and he takes it few times a week.  His CBC showed mild anemia but better compared to 6 months ago.  The patient has no evidence for iron deficiency. I  recommended for him to continue on the oral iron tablets at least 4 times weekly. I will see him back for follow-up visit in 6 months for evaluation with repeat CBC, iron study and ferritin. The patient was advised to call immediately if he has any concerning symptoms in the interval. The patient voices understanding of current disease status and treatment options and is in agreement with the current care plan. All questions were answered. The patient knows to call the clinic with any problems, questions or concerns. We can certainly see the patient much sooner if necessary.  Disclaimer: This note was dictated with voice recognition software. Similar sounding words can inadvertently be transcribed and may not be corrected upon review.

## 2020-06-28 ENCOUNTER — Telehealth: Payer: Self-pay | Admitting: Internal Medicine

## 2020-06-28 NOTE — Telephone Encounter (Signed)
Scheduled per 1/10 los. Called and spoke with pt, confirmed added appts  

## 2020-10-10 ENCOUNTER — Other Ambulatory Visit: Payer: Self-pay | Admitting: Gastroenterology

## 2020-10-10 DIAGNOSIS — K8689 Other specified diseases of pancreas: Secondary | ICD-10-CM

## 2020-10-25 ENCOUNTER — Ambulatory Visit
Admission: RE | Admit: 2020-10-25 | Discharge: 2020-10-25 | Disposition: A | Discharge status: Home / Self Care | Payer: Medicare Other | Source: Ambulatory Visit | Attending: Gastroenterology | Admitting: Gastroenterology

## 2020-10-25 DIAGNOSIS — K8689 Other specified diseases of pancreas: Secondary | ICD-10-CM

## 2020-10-25 MED ORDER — GADOBENATE DIMEGLUMINE 529 MG/ML IV SOLN
16.0000 mL | Freq: Once | INTRAVENOUS | Status: AC | PRN
  Administered 2020-10-25: 16 mL via INTRAVENOUS

## 2020-12-24 ENCOUNTER — Other Ambulatory Visit: Payer: Self-pay

## 2020-12-24 ENCOUNTER — Inpatient Hospital Stay: Payer: Medicare Other | Attending: Internal Medicine | Admitting: Internal Medicine

## 2020-12-24 ENCOUNTER — Inpatient Hospital Stay: Payer: Medicare Other

## 2020-12-24 VITALS — BP 140/79 | HR 68 | Temp 98.4°F | Resp 19 | Ht 71.0 in | Wt 166.2 lb

## 2020-12-24 DIAGNOSIS — D509 Iron deficiency anemia, unspecified: Secondary | ICD-10-CM | POA: Diagnosis not present

## 2020-12-24 DIAGNOSIS — Z79899 Other long term (current) drug therapy: Secondary | ICD-10-CM | POA: Diagnosis not present

## 2020-12-24 DIAGNOSIS — E785 Hyperlipidemia, unspecified: Secondary | ICD-10-CM | POA: Insufficient documentation

## 2020-12-24 DIAGNOSIS — I110 Hypertensive heart disease with heart failure: Secondary | ICD-10-CM | POA: Insufficient documentation

## 2020-12-24 DIAGNOSIS — K319 Disease of stomach and duodenum, unspecified: Secondary | ICD-10-CM | POA: Diagnosis present

## 2020-12-24 DIAGNOSIS — D500 Iron deficiency anemia secondary to blood loss (chronic): Secondary | ICD-10-CM | POA: Diagnosis not present

## 2020-12-24 DIAGNOSIS — I5042 Chronic combined systolic (congestive) and diastolic (congestive) heart failure: Secondary | ICD-10-CM | POA: Diagnosis not present

## 2020-12-24 LAB — CBC WITH DIFFERENTIAL (CANCER CENTER ONLY)
Abs Immature Granulocytes: 0.01 10*3/uL (ref 0.00–0.07)
Basophils Absolute: 0 10*3/uL (ref 0.0–0.1)
Basophils Relative: 0 %
Eosinophils Absolute: 0.4 10*3/uL (ref 0.0–0.5)
Eosinophils Relative: 6 %
HCT: 35 % — ABNORMAL LOW (ref 39.0–52.0)
Hemoglobin: 11.9 g/dL — ABNORMAL LOW (ref 13.0–17.0)
Immature Granulocytes: 0 %
Lymphocytes Relative: 28 %
Lymphs Abs: 1.9 10*3/uL (ref 0.7–4.0)
MCH: 28.1 pg (ref 26.0–34.0)
MCHC: 34 g/dL (ref 30.0–36.0)
MCV: 82.5 fL (ref 80.0–100.0)
Monocytes Absolute: 0.4 10*3/uL (ref 0.1–1.0)
Monocytes Relative: 6 %
Neutro Abs: 4.1 10*3/uL (ref 1.7–7.7)
Neutrophils Relative %: 60 %
Platelet Count: 244 10*3/uL (ref 150–400)
RBC: 4.24 MIL/uL (ref 4.22–5.81)
RDW: 14.5 % (ref 11.5–15.5)
WBC Count: 6.8 10*3/uL (ref 4.0–10.5)
nRBC: 0 % (ref 0.0–0.2)

## 2020-12-24 LAB — IRON AND TIBC
Iron: 48 ug/dL (ref 42–163)
Saturation Ratios: 14 % — ABNORMAL LOW (ref 20–55)
TIBC: 353 ug/dL (ref 202–409)
UIBC: 305 ug/dL (ref 117–376)

## 2020-12-24 LAB — FERRITIN: Ferritin: 53 ng/mL (ref 24–336)

## 2020-12-24 NOTE — Progress Notes (Signed)
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  PRIOR THERAPY: Iron infusion with Ferrlecit weekly for 4 weeks started April 15, 2019  CURRENT THERAPY: Ferrous sulfate 325 mg p.o. daily.  INTERVAL HISTORY: Stephen Chavez 87 y.o. male returns to the clinic today for follow-up visit accompanied by his wife.  The patient is feeling fine today with no concerning complaints except for left flank pain and he was seen at the urgent care center and treated for urinary tract infection.  He denied having any current chest pain, shortness of breath, cough or hemoptysis.  He denied having any fever or chills.  He has no nausea, vomiting, diarrhea or constipation.  He has no headache or visual changes.  He has no significant weight loss or night sweats.  He continues to tolerate the oral iron tablet fairly well.  He is here today for evaluation and repeat blood work.  MEDICAL HISTORY: Past Medical History:  Diagnosis Date   Amaurosis fugax of right eye 09/03/2016   Carotid stenosis 09/03/2016   Mild 05/2016   Chronic combined systolic and diastolic heart failure (HCC) 10/23/2016   Essential hypertension 09/03/2016   Hyperlipidemia    Hypertension     ALLERGIES:  is allergic to allopurinol and colchicine.  MEDICATIONS:  Current Outpatient Medications  Medication Sig Dispense Refill   acetaminophen (TYLENOL) 160 MG/5ML suspension Take 240 mg by mouth every 6 (six) hours as needed for mild pain or headache.     amLODipine (NORVASC) 5 MG tablet Take 1.5 tablets daily (Patient taking differently: Take 5 mg by mouth at bedtime. ) 45 tablet 0   febuxostat (ULORIC) 40 MG tablet Take 40 mg by mouth daily.     ferrous sulfate 325 (65 FE) MG tablet Take 325 mg by mouth  daily with breakfast.     fluticasone (FLONASE) 50 MCG/ACT nasal spray Place 2 sprays into both nostrils daily as needed for allergies or rhinitis.     loratadine (CLARITIN) 10 MG tablet Take 10 mg by mouth at bedtime as needed for allergies or rhinitis.      Multiple Vitamins-Minerals (CENTRUM SILVER) CHEW Chew 2 tablets by mouth daily.     pantoprazole (PROTONIX) 40 MG tablet Take 1 tablet (40 mg total) by mouth daily. 30 tablet 1   pravastatin (PRAVACHOL) 80 MG tablet Take 80 mg by mouth every evening.      Psyllium (METAMUCIL FIBER PO) Take by mouth See admin instructions. Mix 1 tablespoonful of powder into 8 ounces of water or juice and drink once a day     senna (SENOKOT) 8.6 MG TABS tablet Take 1 tablet by mouth daily.     traZODone (DESYREL) 50 MG tablet Take 150 mg by mouth at bedtime as needed.     No current facility-administered medications for this visit.    SURGICAL HISTORY:  Past Surgical History:  Procedure Laterality Date   BIOPSY  04/01/2019   Procedure: BIOPSY;  Surgeon: Kathi Der, MD;  [facility]: [facility];  Service: Gastroenterology;;   COLONOSCOPY WITH PROPOFOL N/A 04/01/2019   Procedure: COLONOSCOPY WITH PROPOFOL;  Surgeon: Kathi Der, MD;  Location: [facility];  Service: Gastroenterology;  Laterality: N/A;   ENTEROSCOPY N/A 04/01/2019   Procedure: Push ENTEROSCOPY;  Surgeon: Kathi Der, MD;  Location:  [facility];  Service: Gastroenterology;  Laterality: N/A;   EYE SURGERY     HOT HEMOSTASIS N/A 04/01/2019   Procedure: HOT HEMOSTASIS (ARGON PLASMA COAGULATION/BICAP);  Surgeon: Kathi Der, MD;  Location[facility] ENDOSCOPY;  Service: Gastroenterology;  Laterality: N/A;   polyp removal     POLYPECTOMY  04/01/2019   Procedure: POLYPECTOMY;  Surgeon: Kathi Der, MD;  Location: [facility];  Service: Gastroenterology;;    REVIEW OF SYSTEMS:  A comprehensive review of systems was negative except for: Constitutional: positive for  fatigue   PHYSICAL EXAMINATION: General appearance: alert, cooperative, and no distress Head: Normocephalic, without obvious abnormality, atraumatic Neck: no adenopathy, no JVD, supple, symmetrical, trachea midline, and thyroid not enlarged, symmetric, no tenderness/mass/nodules Lymph nodes: Cervical, supraclavicular, and axillary nodes normal. Resp: clear to auscultation bilaterally Back: symmetric, no curvature. ROM normal. No CVA tenderness. Cardio: regular rate and rhythm, S1, S2 normal, no murmur, click, rub or gallop GI: soft, non-tender; bowel sounds normal; no masses,  no organomegaly Extremities: extremities normal, atraumatic, no cyanosis or edema  ECOG PERFORMANCE STATUS: 1 - Symptomatic but completely ambulatory  Blood pressure 140/79, pulse 68, temperature 98.4 F (36.9 C), resp. rate 19, height 5\' 11"  (1.803 m), weight 166 lb 3.2 oz (75.4 kg), SpO2 97 %.  LABORATORY DATA: Lab Results  Component Value Date   WBC 6.8 12/24/2020   HGB 11.9 (L) 12/24/2020   HCT 35.0 (L) 12/24/2020   MCV 82.5 12/24/2020   PLT 244 12/24/2020      Chemistry      Component Value Date/Time   NA 144 12/22/2019 1122   K 4.6 12/22/2019 1122   CL 110 12/22/2019 1122   CO2 24 12/22/2019 1122   BUN 20 12/22/2019 1122   CREATININE 1.94 (H) 12/22/2019 1122      Component Value Date/Time   CALCIUM 9.6 12/22/2019 1122   ALKPHOS 84 12/22/2019 1122   AST 13 (L) 12/22/2019 1122   ALT 17 12/22/2019 1122   BILITOT 0.4 12/22/2019 1122       RADIOGRAPHIC STUDIES: No results found.  ASSESSMENT AND PLAN: This is a very pleasant 87 years old African-American male with history of iron deficiency anemia secondary to gastrointestinal blood loss from AV malformation. The patient was treated with intravenous iron infusion with Ferrlecit weekly for 4 weeks.  He tolerated the infusion fairly well.  The patient is currently on oral iron tablet few times a week and tolerating it well. CBC today showed  very mild anemia and consistent with the previous value.  Iron study and ferritin are still pending. I recommended for him to continue his oral iron tablets for now. I will see him back for follow-up visit in 6 months for evaluation and repeat blood work. The patient was advised to call immediately if she has any concerning symptoms in the interval. The patient voices understanding of current disease status and treatment options and is in agreement with the current care plan. All questions were answered. The patient knows to call the clinic with any problems, questions or concerns. We can certainly see the patient much sooner if necessary.  Disclaimer: This note was dictated with voice recognition software. Similar sounding words can inadvertently be transcribed and may not be corrected upon review.

## 2020-12-25 ENCOUNTER — Telehealth: Payer: Self-pay | Admitting: Internal Medicine

## 2020-12-25 NOTE — Telephone Encounter (Signed)
Scheduled per los. Called, left msg. Mailed printout  

## 2021-03-27 ENCOUNTER — Telehealth (HOSPITAL_BASED_OUTPATIENT_CLINIC_OR_DEPARTMENT_OTHER): Payer: Self-pay | Admitting: Cardiovascular Disease

## 2021-03-27 NOTE — Telephone Encounter (Signed)
Error

## 2021-04-04 ENCOUNTER — Encounter (HOSPITAL_BASED_OUTPATIENT_CLINIC_OR_DEPARTMENT_OTHER): Payer: Self-pay

## 2021-05-17 ENCOUNTER — Ambulatory Visit
Admission: RE | Admit: 2021-05-17 | Discharge: 2021-05-17 | Disposition: A | Discharge status: Home / Self Care | Payer: Medicare Other | Source: Ambulatory Visit | Attending: Family Medicine | Admitting: Family Medicine

## 2021-05-17 ENCOUNTER — Other Ambulatory Visit: Payer: Self-pay | Admitting: Family Medicine

## 2021-05-17 DIAGNOSIS — R059 Cough, unspecified: Secondary | ICD-10-CM

## 2021-06-23 ENCOUNTER — Encounter: Payer: Self-pay | Admitting: Internal Medicine

## 2021-06-26 ENCOUNTER — Other Ambulatory Visit: Payer: Self-pay

## 2021-06-26 DIAGNOSIS — D509 Iron deficiency anemia, unspecified: Secondary | ICD-10-CM

## 2021-06-27 ENCOUNTER — Inpatient Hospital Stay: Payer: Medicare Other

## 2021-06-27 ENCOUNTER — Other Ambulatory Visit: Payer: Self-pay

## 2021-06-27 ENCOUNTER — Inpatient Hospital Stay: Payer: Medicare Other | Attending: Internal Medicine | Admitting: Internal Medicine

## 2021-06-27 VITALS — BP 134/78 | HR 73 | Temp 98.5°F | Resp 18 | Ht 71.0 in | Wt 173.5 lb

## 2021-06-27 DIAGNOSIS — I110 Hypertensive heart disease with heart failure: Secondary | ICD-10-CM | POA: Diagnosis not present

## 2021-06-27 DIAGNOSIS — D500 Iron deficiency anemia secondary to blood loss (chronic): Secondary | ICD-10-CM | POA: Insufficient documentation

## 2021-06-27 DIAGNOSIS — I5042 Chronic combined systolic (congestive) and diastolic (congestive) heart failure: Secondary | ICD-10-CM | POA: Insufficient documentation

## 2021-06-27 DIAGNOSIS — Z79899 Other long term (current) drug therapy: Secondary | ICD-10-CM | POA: Insufficient documentation

## 2021-06-27 DIAGNOSIS — D649 Anemia, unspecified: Secondary | ICD-10-CM

## 2021-06-27 DIAGNOSIS — K319 Disease of stomach and duodenum, unspecified: Secondary | ICD-10-CM | POA: Insufficient documentation

## 2021-06-27 DIAGNOSIS — D509 Iron deficiency anemia, unspecified: Secondary | ICD-10-CM

## 2021-06-27 LAB — CBC WITH DIFFERENTIAL (CANCER CENTER ONLY)
Abs Immature Granulocytes: 0.01 10*3/uL (ref 0.00–0.07)
Basophils Absolute: 0 10*3/uL (ref 0.0–0.1)
Basophils Relative: 1 %
Eosinophils Absolute: 0.5 10*3/uL (ref 0.0–0.5)
Eosinophils Relative: 7 %
HCT: 34.4 % — ABNORMAL LOW (ref 39.0–52.0)
Hemoglobin: 11.6 g/dL — ABNORMAL LOW (ref 13.0–17.0)
Immature Granulocytes: 0 %
Lymphocytes Relative: 29 %
Lymphs Abs: 2.2 10*3/uL (ref 0.7–4.0)
MCH: 28.2 pg (ref 26.0–34.0)
MCHC: 33.7 g/dL (ref 30.0–36.0)
MCV: 83.5 fL (ref 80.0–100.0)
Monocytes Absolute: 0.4 10*3/uL (ref 0.1–1.0)
Monocytes Relative: 6 %
Neutro Abs: 4.4 10*3/uL (ref 1.7–7.7)
Neutrophils Relative %: 57 %
Platelet Count: 211 10*3/uL (ref 150–400)
RBC: 4.12 MIL/uL — ABNORMAL LOW (ref 4.22–5.81)
RDW: 13.8 % (ref 11.5–15.5)
WBC Count: 7.6 10*3/uL (ref 4.0–10.5)
nRBC: 0 % (ref 0.0–0.2)

## 2021-06-27 LAB — IRON AND IRON BINDING CAPACITY (CC-WL,HP ONLY)
Iron: 54 ug/dL (ref 45–182)
Saturation Ratios: 16 % — ABNORMAL LOW (ref 17.9–39.5)
TIBC: 339 ug/dL (ref 250–450)
UIBC: 285 ug/dL (ref 117–376)

## 2021-06-27 LAB — FERRITIN: Ferritin: 75 ng/mL (ref 24–336)

## 2021-06-27 NOTE — Progress Notes (Signed)
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  PRIOR THERAPY: Iron infusion with Ferrlecit weekly for 4 weeks started April 15, 2019  CURRENT THERAPY: Ferrous sulfate 325 mg p.o. daily.  INTERVAL HISTORY: Stephen Chavez 88 y.o. male returns to the clinic today for follow-up visit accompanied by his wife.  The patient is feeling fine today with no concerning complaints.  He continues to have mild fatigue.  He denied having any bleeding, bruises or ecchymosis.  He has no nausea, vomiting, diarrhea or constipation.  He has no chest pain, shortness of breath, cough or hemoptysis.  He has no headache or visual changes.  He continues to tolerate his oral iron tablet fairly well.  The patient is here today for evaluation and repeat CBC, iron study and ferritin.  MEDICAL HISTORY: Past Medical History:  Diagnosis Date   Amaurosis fugax of right eye 09/03/2016   Carotid stenosis 09/03/2016   Mild 05/2016   Chronic combined systolic and diastolic heart failure ([place]) 10/23/2016   Essential hypertension 09/03/2016   Hyperlipidemia    Hypertension     ALLERGIES:  is allergic to allopurinol and colchicine.  MEDICATIONS:  Current Outpatient Medications  Medication Sig Dispense Refill   acetaminophen (TYLENOL) 160 MG/5ML suspension Take 240 mg by mouth every 6 (six) hours as needed for mild pain or headache.     amLODipine (NORVASC) 5 MG tablet Take 1.5 tablets daily (Patient taking differently: Take 5 mg by mouth at bedtime. ) 45 tablet 0   febuxostat (ULORIC) 40 MG tablet Take 40 mg by mouth daily.     ferrous sulfate 325 (65 FE) MG tablet Take 325 mg by mouth daily with breakfast.     fluticasone (FLONASE) 50 MCG/ACT nasal spray Place 2 sprays into both  nostrils daily as needed for allergies or rhinitis.     loratadine (CLARITIN) 10 MG tablet Take 10 mg by mouth at bedtime as needed for allergies or rhinitis.      Multiple Vitamins-Minerals (CENTRUM SILVER) CHEW Chew 2 tablets by mouth daily.     pantoprazole (PROTONIX) 40 MG tablet Take 1 tablet (40 mg total) by mouth daily. 30 tablet 1   pravastatin (PRAVACHOL) 80 MG tablet Take 80 mg by mouth every evening.      Psyllium (METAMUCIL FIBER PO) Take by mouth See admin instructions. Mix 1 tablespoonful of powder into 8 ounces of water or juice and drink once a day     senna (SENOKOT) 8.6 MG TABS tablet Take 1 tablet by mouth daily.     traZODone (DESYREL) 50 MG tablet Take 150 mg by mouth at bedtime as needed.     No current facility-administered medications for this visit.    SURGICAL HISTORY:  Past Surgical History:  Procedure Laterality Date   BIOPSY  04/01/2019   Procedure: BIOPSY;  Surgeon: Otis Brace, MD;  Location: [facility];  Service: Gastroenterology;;   COLONOSCOPY WITH PROPOFOL N/A 04/01/2019   Procedure: COLONOSCOPY WITH PROPOFOL;  Surgeon: Otis Brace, MD;  Location: [facility];  Service: Gastroenterology;  Laterality: N/A;   ENTEROSCOPY N/A 04/01/2019   Procedure: Push ENTEROSCOPY;  Surgeon: Otis Brace, MD;  Location: [facility];  Service: Gastroenterology;  Laterality: N/A;   EYE SURGERY     HOT HEMOSTASIS N/A 04/01/2019  Procedure: HOT HEMOSTASIS (ARGON PLASMA COAGULATION/BICAP);  Surgeon: Otis Brace, MD;  Location: [facility] ENDOSCOPY;  Service: Gastroenterology;  Laterality: N/A;   polyp removal     POLYPECTOMY  04/01/2019   Procedure: POLYPECTOMY;  Surgeon: Otis Brace, MD;  Location: [facility] ENDOSCOPY;  Service: Gastroenterology;;    REVIEW OF SYSTEMS:  A comprehensive review of systems was negative except for: Constitutional: positive for fatigue   PHYSICAL EXAMINATION: General appearance: alert, cooperative, and no distress Head:  Normocephalic, without obvious abnormality, atraumatic Neck: no adenopathy, no JVD, supple, symmetrical, trachea midline, and thyroid not enlarged, symmetric, no tenderness/mass/nodules Lymph nodes: Cervical, supraclavicular, and axillary nodes normal. Resp: clear to auscultation bilaterally Back: symmetric, no curvature. ROM normal. No CVA tenderness. Cardio: regular rate and rhythm, S1, S2 normal, no murmur, click, rub or gallop GI: soft, non-tender; bowel sounds normal; no masses,  no organomegaly Extremities: extremities normal, atraumatic, no cyanosis or edema  ECOG PERFORMANCE STATUS: 1 - Symptomatic but completely ambulatory  Blood pressure 134/78, pulse 73, temperature 98.5 F (36.9 C), temperature source Oral, resp. rate 18, height 5\' 11"  (1.803 m), weight 173 lb 8 oz (78.7 kg), SpO2 98 %.  LABORATORY DATA: Lab Results  Component Value Date   WBC 7.6 06/27/2021   HGB 11.6 (L) 06/27/2021   HCT 34.4 (L) 06/27/2021   MCV 83.5 06/27/2021   PLT 211 06/27/2021      Chemistry      Component Value Date/Time   NA 144 12/22/2019 1122   K 4.6 12/22/2019 1122   CL 110 12/22/2019 1122   CO2 24 12/22/2019 1122   BUN 20 12/22/2019 1122   CREATININE 1.94 (H) 12/22/2019 1122      Component Value Date/Time   CALCIUM 9.6 12/22/2019 1122   ALKPHOS 84 12/22/2019 1122   AST 13 (L) 12/22/2019 1122   ALT 17 12/22/2019 1122   BILITOT 0.4 12/22/2019 1122       RADIOGRAPHIC STUDIES: No results found.  ASSESSMENT AND PLAN: This is a very pleasant 88 years old African-American male with history of iron deficiency anemia secondary to gastrointestinal blood loss from AV malformation. The patient was treated with intravenous iron infusion with Ferrlecit weekly for 4 weeks.  Is currently on oral iron tablets with ferrous sulfate 325 mg p.o. daily with vitamin C.  He is tolerating his oral iron tablet fairly well. Repeat CBC showed persistent mild anemia.  Iron study and ferritin are still  pending. I recommended for the patient to continue his current treatment with the oral iron tablet.  I will consider him for iron infusion if the pending iron studies showed significant iron deficiency. I will see the patient back for follow-up visit in 6 months for evaluation and repeat CBC, iron study and ferritin. He was advised to call immediately if he has any other concerning symptoms in the interval. The patient voices understanding of current disease status and treatment options and is in agreement with the current care plan. All questions were answered. The patient knows to call the clinic with any problems, questions or concerns. We can certainly see the patient much sooner if necessary.  Disclaimer: This note was dictated with voice recognition software. Similar sounding words can inadvertently be transcribed and may not be corrected upon review.

## 2021-07-05 ENCOUNTER — Encounter (HOSPITAL_BASED_OUTPATIENT_CLINIC_OR_DEPARTMENT_OTHER): Payer: Self-pay | Admitting: Cardiovascular Disease

## 2021-07-05 ENCOUNTER — Other Ambulatory Visit: Payer: Self-pay

## 2021-07-05 ENCOUNTER — Ambulatory Visit (HOSPITAL_BASED_OUTPATIENT_CLINIC_OR_DEPARTMENT_OTHER): Payer: Medicare Other | Admitting: Cardiovascular Disease

## 2021-07-05 VITALS — BP 122/74 | HR 61 | Ht 71.0 in | Wt 174.9 lb

## 2021-07-05 DIAGNOSIS — R5383 Other fatigue: Secondary | ICD-10-CM | POA: Diagnosis not present

## 2021-07-05 DIAGNOSIS — I10 Essential (primary) hypertension: Secondary | ICD-10-CM | POA: Diagnosis not present

## 2021-07-05 DIAGNOSIS — I2510 Atherosclerotic heart disease of native coronary artery without angina pectoris: Secondary | ICD-10-CM | POA: Diagnosis not present

## 2021-07-05 DIAGNOSIS — R0602 Shortness of breath: Secondary | ICD-10-CM | POA: Diagnosis not present

## 2021-07-05 HISTORY — DX: Atherosclerotic heart disease of native coronary artery without angina pectoris: I25.10

## 2021-07-05 MED ORDER — ROSUVASTATIN CALCIUM 40 MG PO TABS: 40.0000 mg | ORAL_TABLET | Freq: Every day | ORAL | 3 refills | Status: DC

## 2021-07-05 NOTE — Progress Notes (Signed)
Cardiology Office Note   Date:  07/05/2021   ID:  Stephen Chavez, DOB [birth year], MRN [id number]  PCP:  Margretta Sidle, MD  Cardiologist:   Skeet Latch, MD   No chief complaint on file.   History of Present Illness: Stephen Chavez is a 88 y.o. 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  Stephen Chavez had transient monocular visual loss in his right eye 05/2016. The episode lasted several minutes. Since then it is have been 3 or 4 times in the same eye. He has been seen by an ophthalmologist and no pathology was noted.  His symptoms were felt to be due to amarourosis fugax and TIA evaluation was recommended.  He had carotid Dopplers that revealed mild carotid stenosis bilaterally.  Aspirin was increased from 81 mg 325 mg daily. He followed up with his PCP, [name], and was referred to cardiology for further evaluation.  He was seen in 2017 and had an echo with bubble that revealed LVEF 45 to 50% with diffuse hypokinesis and grade 1 diastolic dysfunction.  There is moderate tricuspid regurgitation.  It was negative for atrial shunting.  He wore a 30-day monitor that did not reveal any significant arrhythmias.  His blood pressure was poorly controlled so amlodipine was increased.  He has not been seen by cardiology since that time.  He had an echo 06/2018 which revealed LVEF 60 to 65% with grade 1 diastolic dysfunction.  PASP was 40 mmHg.  Stephen Chavez has iron deficiency anemia in the setting of GI AVMs.  He gets iron infusions with Dr. Inda Merlin.  He had bilateral PE 06/2018.  He was noted to have calcification of the LAD on chest CT at that time.  His wife notes that he is sometimes short of breath.  She notes that he isn't getting much exercise.  Sometimes just getting him dressed gets him short of breath.  He has mild LE edema at times but has no orthopnea or PND.  He  denies any chest pain or pressure.  They do not check his blood pressure regularly at home.  She does try to limit his salt intake.  For the last few days he is struggled with sleep.  He does snore but denies daytime somnolence and she has not noticed any apneic episodes.  Past Medical History:  Diagnosis Date   Amaurosis fugax of right eye 09/03/2016   CAD in native artery 07/05/2021   Carotid stenosis 09/03/2016   Mild 05/2016   Chronic combined systolic and diastolic heart failure ([place]) 10/23/2016   Essential hypertension 09/03/2016   Hyperlipidemia    Hypertension     Past Surgical History:  Procedure Laterality Date   BIOPSY  04/01/2019   Procedure: BIOPSY;  Surgeon: Otis Brace, MD;  Location: [place];  Service: Gastroenterology;;   COLONOSCOPY WITH PROPOFOL N/A 04/01/2019   Procedure: COLONOSCOPY WITH PROPOFOL;  Surgeon: Otis Brace, MD;  Location: [place];  Service: Gastroenterology;  Laterality: N/A;   ENTEROSCOPY N/A 04/01/2019   Procedure: Push ENTEROSCOPY;  Surgeon: Otis Brace, MD;  Location: [facility] ENDOSCOPY;  Service: Gastroenterology;  Laterality: N/A;   EYE SURGERY     HOT HEMOSTASIS N/A 04/01/2019   Procedure: HOT HEMOSTASIS (ARGON PLASMA COAGULATION/BICAP);  Surgeon: Otis Brace, MD;  Location: [facility] ENDOSCOPY;  Service: Gastroenterology;  Laterality: N/A;   polyp removal     POLYPECTOMY  04/01/2019   Procedure: POLYPECTOMY;  Surgeon: Kathi DerBrahmbhatt, Parag, MD;  Location: [facility]OS[facility];  Service: Gastroenterology;;     Current Outpatient Medications  Medication Sig Dispense Refill   acetaminophen (TYLENOL) 160 MG/5ML suspension Take 240 mg by mouth every 6 (six) hours as needed for mild pain or headache.     amLODipine (NORVASC) 5 MG tablet Take 5 mg by mouth daily.     febuxostat (ULORIC) 40 MG tablet Take 40 mg by mouth daily.     ferrous sulfate 325 (65 FE) MG tablet Take 325 mg by mouth daily with breakfast.     fluticasone (FLONASE) 50  MCG/ACT nasal spray Place 2 sprays into both nostrils daily as needed for allergies or rhinitis.     loratadine (CLARITIN) 10 MG tablet Take 10 mg by mouth at bedtime as needed for allergies or rhinitis.      Multiple Vitamins-Minerals (CENTRUM SILVER) CHEW Chew 2 tablets by mouth daily.     pravastatin (PRAVACHOL) 80 MG tablet Take 80 mg by mouth every evening.      predniSONE (DELTASONE) 5 MG tablet See admin instructions. As directed     Psyllium (METAMUCIL FIBER PO) Take by mouth See admin instructions. Mix 1 tablespoonful of powder into 8 ounces of water or juice and drink once a day     senna (SENOKOT) 8.6 MG TABS tablet Take 1 tablet by mouth daily.     traZODone (DESYREL) 50 MG tablet Take 150 mg by mouth at bedtime as needed.     pantoprazole (PROTONIX) 40 MG tablet Take 1 tablet (40 mg total) by mouth daily. 30 tablet 1   No current facility-administered medications for this visit.    Allergies:   Allopurinol and Colchicine    Social History:  The patient  reports that he has never smoked. He has never used smokeless tobacco. He reports current alcohol use of about 4.0 standard drinks per week. He reports that he does not use drugs.   Family History:  The patient's family history includes Hypertension in his mother.    ROS:  Please see the history of present illness.   Otherwise, review of systems are positive for none.   All other systems are reviewed and negative.    PHYSICAL EXAM: VS:  BP 122/74    Pulse 61    Ht 5\' 11"  (1.803 m)    Wt 174 lb 14.4 oz (79.3 kg)    BMI 24.39 kg/m  , BMI Body mass index is 24.39 kg/m. GENERAL:  Well appearing HEENT:  Pupils equal round and reactive, fundi not visualized, oral mucosa unremarkable NECK:  No jugular venous distention, waveform within normal limits, carotid upstroke brisk and symmetric, no bruits, no thyromegaly LYMPHATICS:  No cervical adenopathy LUNGS:  Clear to auscultation bilaterally HEART:  RRR.  PMI not displaced or  sustained,S1 and S2 within normal limits, no S3, no S4, no clicks, no rubs, no murmurs ABD:  Flat, positive bowel sounds normal in frequency in pitch, no bruits, no rebound, no guarding, no midline pulsatile mass, no hepatomegaly, no splenomegaly EXT:  2 plus pulses throughout, no edema, no cyanosis no clubbing SKIN:  No rashes no nodules NEURO:  Cranial nerves II through XII grossly intact, motor grossly intact throughout PSYCH:  Cognitively intact, oriented to person place and time   EKG:  EKG is ordered today. The ekg ordered today demonstrates sinus rhythm.  Rate 61 bpm.  LAFB.  LVH.  30 Day Event Monitor 09/2016:  Quality: Fair.  Baseline artifact. Predominant rhythm: sinus rhythm Average heart rate: 73 bpm Minimum heart rate 59 bpm Maximum heart rate 99 bpm   No arrhythmias noted  Echo 09/23/16: Study Conclusions   - Left ventricle: The cavity size was normal. Wall thickness was    increased in a pattern of moderate LVH. There was severe focal    basal hypertrophy of the septum. Systolic function was mildly    reduced. The estimated ejection fraction was in the range of 45%    to 50%. Diffuse hypokinesis. Doppler parameters are consistent    with abnormal left ventricular relaxation (grade 1 diastolic    dysfunction). The E/e&' ratio is between 8-15, suggesting    indeterminate LV filling pressure.  - Aortic valve: Trileaflet. Sclerosis without stenosis. There was    trivial regurgitation.  - Mitral valve: Mildly thickened leaflets . There was trivial    regurgitation.  - Tricuspid valve: There was moderate regurgitation.  - Pulmonary arteries: PA peak pressure: 28 mm Hg (S).  - Inferior vena cava: The vessel was normal in size. The    respirophasic diameter changes were in the normal range (= 50%),    consistent with normal central venous pressure.   Echo 06/28/18: Study Conclusions   - Left ventricle: The cavity size was normal. Wall thickness was    increased in a  pattern of mild LVH. Systolic function was normal.    The estimated ejection fraction was in the range of 60% to 65%.    Wall motion was normal; there were no regional wall motion    abnormalities. Doppler parameters are consistent with abnormal    left ventricular relaxation (grade 1 diastolic dysfunction). The    E;e&' ratio is between 8-15, suggesting indeterminate LV filling    pressure.  - Aortic valve: Trileaflet. Sclerosis without stenosis. There was    trivial regurgitation.  - Left atrium: The atrium was normal in size.  - Right ventricle: The cavity size was normal. Wall thickness was    normal. Systolic function was normal.  - Right atrium: The atrium was normal in size.  - Tricuspid valve: There was mild regurgitation.  - Pulmonary arteries: PA peak pressure: 40 mm Hg (S) + RAP.  - Pericardium, extracardiac: Not well visualized.   Impressions:   - Compared to a prior study in 2018, the LVEF is higher at 60-65%.   Recent Labs: 06/27/2021: Hemoglobin 11.6; Platelet Count 211    Lipid Panel No results found for: CHOL, TRIG, HDL, CHOLHDL, VLDL, LDLCALC, LDLDIRECT    Wt Readings from Last 3 Encounters:  07/05/21 174 lb 14.4 oz (79.3 kg)  06/27/21 173 lb 8 oz (78.7 kg)  12/24/20 166 lb 3.2 oz (75.4 kg)      ASSESSMENT AND PLAN:  Essential hypertension BP elevated today initially but improved on repeat.  Continue amlodipine.  He is going to work on trying to increase his exercise.  CKD (chronic kidney disease), stage III Creatinine is 2.  We will avoid IV contrast if at all possible.  Chronic diastolic CHF (congestive heart failure) (HCC) LVEF was previously 40 to 45% but improved with time.  He now reports some shortness of breath and lower extremity edema.  We will repeat an echocardiogram to assess.  HLD (hyperlipidemia) LDL goal is less than 70.  Switch pravastatin to rosuvastatin 40 mg.  Repeat lipids and a CMP in 3 months.  CAD in native artery LAD  calcification noted on chest CT.  He does report exertional dyspnea.  We will get a Lexiscan Myoview to assess for ischemia.  Treating lipids as above.    Current medicines are reviewed at length with the patient today.  The patient does not have concerns regarding medicines.  The following changes have been made: switch pravastatin to rosuvastatin  Labs/ tests ordered today include:   Orders Placed This Encounter  Procedures   TSH   MYOCARDIAL PERFUSION IMAGING   EKG 12-Lead   ECHOCARDIOGRAM COMPLETE     Disposition:   FU with Kailan Laws C. Oval Linsey, MD, [facility] in 3 months.      Signed, Natisha Trzcinski C. Oval Linsey, MD, [facility]  07/05/2021 3:39 PM    [place]

## 2021-07-05 NOTE — Assessment & Plan Note (Signed)
Creatinine is 2.  We will avoid IV contrast if at all possible.

## 2021-07-05 NOTE — Assessment & Plan Note (Addendum)
BP elevated today initially but improved on repeat.  Continue amlodipine.  He is going to work on trying to increase his exercise.

## 2021-07-05 NOTE — Patient Instructions (Signed)
Medication Instructions:  STOP PRAVACHOL   START ROSUVASTATIN 40 MG DAILY   *If you need a refill on your cardiac medications before your next appointment, please call your pharmacy*  Lab Work: TSH TODAY   FASTING LP/CMET IN 3 MONTHS PRIOR TO FOLLOW UP   If you have labs (blood work) drawn today and your tests are completely normal, you will receive your results only by: MyChart Message (if you have MyChart) OR A paper copy in the mail If you have any lab test that is abnormal or we need to change your treatment, we will call you to review the results.  Testing/Procedures: Your physician has requested that you have an echocardiogram. Echocardiography is a painless test that uses sound waves to create images of your heart. It provides your doctor with information about the size and shape of your heart and how well your hearts chambers and valves are working. This procedure takes approximately one hour. There are no restrictions for this procedure.   Your physician has requested that you have a lexiscan myoview. For further information please visit [email or web address] Please follow instruction sheet, as given.  Follow-Up: At [facility], you and your health needs are our priority.  As part of our continuing mission to provide you with exceptional heart care, we have created designated Provider Care Teams.  These Care Teams include your primary Cardiologist (physician) and Advanced Practice Providers (APPs -  Physician Assistants and Nurse Practitioners) who all work together to provide you with the care you need, when you need it.  We recommend signing up for the patient portal called "MyChart".  Sign up information is provided on this After Visit Summary.  MyChart is used to connect with patients for Virtual Visits (Telemedicine).  Patients are able to view lab/test results, encounter notes, upcoming appointments, etc.  Non-urgent messages can be sent to your provider as well.   To  learn more about what you can do with MyChart, go to [email or web address].    Your next appointment:   3 month(s) AFTER YOU HAVE YOUR LABS   The format for your next appointment:   In Person  Provider:   Chilton Si, MD

## 2021-07-05 NOTE — Assessment & Plan Note (Signed)
LAD calcification noted on chest CT.  He does report exertional dyspnea.  We will get a Lexiscan Myoview to assess for ischemia.  Treating lipids as above.

## 2021-07-05 NOTE — Assessment & Plan Note (Signed)
LVEF was previously 40 to 45% but improved with time.  He now reports some shortness of breath and lower extremity edema.  We will repeat an echocardiogram to assess.

## 2021-07-05 NOTE — Assessment & Plan Note (Signed)
LDL goal is less than 70.  Switch pravastatin to rosuvastatin 40 mg.  Repeat lipids and a CMP in 3 months.

## 2021-07-06 LAB — TSH: TSH: 0.407 u[IU]/mL — ABNORMAL LOW (ref 0.450–4.500)

## 2021-07-08 ENCOUNTER — Telehealth (HOSPITAL_BASED_OUTPATIENT_CLINIC_OR_DEPARTMENT_OTHER): Payer: Self-pay | Admitting: Cardiovascular Disease

## 2021-07-08 ENCOUNTER — Other Ambulatory Visit (HOSPITAL_BASED_OUTPATIENT_CLINIC_OR_DEPARTMENT_OTHER): Payer: Self-pay

## 2021-07-08 NOTE — Telephone Encounter (Signed)
°*  STAT* If patient is at the pharmacy, call can be transferred to refill team.   1. Which medications need to be refilled? (please list name of each medication and dose if known) new prescription for Crestor  2. Which pharmacy/location (including street and city if local pharmacy) is medication to be sent to? [facility]  3. Do they need a 30 day or 90 day supply? 90 days and refills

## 2021-07-08 NOTE — Telephone Encounter (Signed)
Attempted to contact patient to clarify which pharmacy he wanted [place] to use, no answer. Left message with instructions to call back

## 2021-07-09 MED ORDER — ROSUVASTATIN CALCIUM 40 MG PO TABS: 40.0000 mg | ORAL_TABLET | Freq: Every day | ORAL | 3 refills | Status: DC

## 2021-07-09 NOTE — Telephone Encounter (Signed)
Patient called back.  He stated he uses [facility], [id number], [place] [address],  [phone]

## 2021-07-15 ENCOUNTER — Telehealth (HOSPITAL_COMMUNITY): Payer: Self-pay | Admitting: *Deleted

## 2021-07-15 NOTE — Telephone Encounter (Signed)
Left message on voicemail per DPR in reference to upcoming appointment scheduled on 07/22/2021 at 10:15 with detailed instructions given per Myocardial Perfusion Study Information Sheet for the test. LM to arrive 15 minutes early, and that it is imperative to arrive on time for appointment to keep from having the test rescheduled. If you need to cancel or reschedule your appointment, please call the office within 24 hours of your appointment. Failure to do so may result in a cancellation of your appointment, and a $50 no show fee. Phone number given for call back for any questions.

## 2021-07-18 ENCOUNTER — Encounter (HOSPITAL_BASED_OUTPATIENT_CLINIC_OR_DEPARTMENT_OTHER): Payer: Self-pay

## 2021-07-22 ENCOUNTER — Ambulatory Visit (HOSPITAL_COMMUNITY): Payer: Medicare Other | Attending: Cardiovascular Disease

## 2021-07-22 ENCOUNTER — Ambulatory Visit (HOSPITAL_BASED_OUTPATIENT_CLINIC_OR_DEPARTMENT_OTHER): Payer: Medicare Other

## 2021-07-22 ENCOUNTER — Other Ambulatory Visit: Payer: Self-pay

## 2021-07-22 DIAGNOSIS — I10 Essential (primary) hypertension: Secondary | ICD-10-CM

## 2021-07-22 DIAGNOSIS — R5383 Other fatigue: Secondary | ICD-10-CM | POA: Insufficient documentation

## 2021-07-22 DIAGNOSIS — R0602 Shortness of breath: Secondary | ICD-10-CM

## 2021-07-22 LAB — MYOCARDIAL PERFUSION IMAGING
LV dias vol: 80 mL (ref 62–150)
LV sys vol: 33 mL
Nuc Stress EF: 59 %
Rest Nuclear Isotope Dose: 10.8 mCi
SDS: 2
SRS: 3
SSS: 5
ST Depression (mm): 0 mm
Stress Nuclear Isotope Dose: 29.7 mCi
TID: 0.97

## 2021-07-22 LAB — ECHOCARDIOGRAM COMPLETE
Area-P 1/2: 1.98 cm2
S' Lateral: 2.7 cm

## 2021-07-22 MED ORDER — TECHNETIUM TC 99M TETROFOSMIN IV KIT
10.8000 | PACK | Freq: Once | INTRAVENOUS | Status: AC | PRN
  Administered 2021-07-22: 10.8 via INTRAVENOUS
  Filled 2021-07-22: qty 11

## 2021-07-22 MED ORDER — REGADENOSON 0.4 MG/5ML IV SOLN
0.4000 mg | Freq: Once | INTRAVENOUS | Status: AC
  Administered 2021-07-22: 0.4 mg via INTRAVENOUS

## 2021-07-22 MED ORDER — TECHNETIUM TC 99M TETROFOSMIN IV KIT
29.7000 | PACK | Freq: Once | INTRAVENOUS | Status: AC | PRN
  Administered 2021-07-22: 29.7 via INTRAVENOUS
  Filled 2021-07-22: qty 30

## 2021-07-23 ENCOUNTER — Telehealth: Payer: Self-pay | Admitting: Cardiovascular Disease

## 2021-07-23 NOTE — Telephone Encounter (Signed)
Left message for pt to call.

## 2021-07-23 NOTE — Telephone Encounter (Signed)
Patient's wife states the patient has been experiencing extreme fatigue and she would like to know if Dr. Duke Salvia has any recommendations.

## 2021-07-24 NOTE — Telephone Encounter (Signed)
-----   Message from Chilton Si, MD sent at 07/24/2021 12:33 PM EST ----- Echo shows that his heart is pumping function remains abnormal.  I do not see anything here that would be causing him to have edema or to be fatigued.

## 2021-07-24 NOTE — Telephone Encounter (Signed)
Patient's wife calling back. She states she spoke with his PCP and [facility]. She says on 1/7 he was with the cancer center they said vitals were good, but his TSH looked good.

## 2021-07-24 NOTE — Telephone Encounter (Signed)
-----   Message from Skeet Latch, MD sent at 07/24/2021 12:34 PM EST ----- Stress test is normal.  I do not think his heart is causing his fatigue.

## 2021-07-24 NOTE — Telephone Encounter (Signed)
Chilton Si, MD to Me      1:36 PM Resulted his labs and studies.  Recommend PCP follow-up.   Advised wife of results and need for repeat labs She is waiting for PCP office to call her back regarding fatigue If patient sees PCP she will ask for the thyroid labs to be done, she wrote down what Dr Duke Salvia recemmending She will call the office back and let us know Sent results to PCP

## 2021-07-24 NOTE — Telephone Encounter (Signed)
-----   Message from Chilton Si, MD sent at 07/24/2021 12:31 PM EST ----- Thyroid is very mildly abnormal.  Recommend checking TSH again and adding T3 and free T4.

## 2021-07-24 NOTE — Telephone Encounter (Signed)
Really tired, no energy. Has gotten worse since visit with Dr Kelvin Cellar with PCP and offered patient come in for labs  Patient has had nuclear and Echo since visit Not eating or drinking well, no appetite  Advised to contact PCP for visit  Will forward to Dr Duke Salvia for review

## 2021-07-25 ENCOUNTER — Other Ambulatory Visit: Payer: Self-pay

## 2021-07-25 ENCOUNTER — Encounter (HOSPITAL_COMMUNITY): Payer: Self-pay | Admitting: *Deleted

## 2021-07-25 ENCOUNTER — Emergency Department (HOSPITAL_COMMUNITY)
Admission: EM | Admit: 2021-07-25 | Discharge: 2021-07-25 | Disposition: A | Discharge status: Home / Self Care | Payer: Medicare Other | Attending: Emergency Medicine | Admitting: Emergency Medicine

## 2021-07-25 DIAGNOSIS — I2510 Atherosclerotic heart disease of native coronary artery without angina pectoris: Secondary | ICD-10-CM | POA: Insufficient documentation

## 2021-07-25 DIAGNOSIS — R531 Weakness: Secondary | ICD-10-CM | POA: Diagnosis not present

## 2021-07-25 DIAGNOSIS — N189 Chronic kidney disease, unspecified: Secondary | ICD-10-CM | POA: Insufficient documentation

## 2021-07-25 DIAGNOSIS — R630 Anorexia: Secondary | ICD-10-CM | POA: Diagnosis not present

## 2021-07-25 DIAGNOSIS — R0981 Nasal congestion: Secondary | ICD-10-CM | POA: Diagnosis not present

## 2021-07-25 DIAGNOSIS — Z79899 Other long term (current) drug therapy: Secondary | ICD-10-CM | POA: Diagnosis not present

## 2021-07-25 DIAGNOSIS — R059 Cough, unspecified: Secondary | ICD-10-CM | POA: Diagnosis not present

## 2021-07-25 LAB — URINALYSIS, ROUTINE W REFLEX MICROSCOPIC
Bilirubin Urine: NEGATIVE
Glucose, UA: NEGATIVE mg/dL
Hgb urine dipstick: NEGATIVE
Ketones, ur: NEGATIVE mg/dL
Leukocytes,Ua: NEGATIVE
Nitrite: NEGATIVE
Protein, ur: NEGATIVE mg/dL
Specific Gravity, Urine: 1.011 (ref 1.005–1.030)
pH: 5 (ref 5.0–8.0)

## 2021-07-25 LAB — CBC WITH DIFFERENTIAL/PLATELET
Abs Immature Granulocytes: 0.02 10*3/uL (ref 0.00–0.07)
Basophils Absolute: 0 10*3/uL (ref 0.0–0.1)
Basophils Relative: 0 %
Eosinophils Absolute: 0.4 10*3/uL (ref 0.0–0.5)
Eosinophils Relative: 6 %
HCT: 33.7 % — ABNORMAL LOW (ref 39.0–52.0)
Hemoglobin: 11.5 g/dL — ABNORMAL LOW (ref 13.0–17.0)
Immature Granulocytes: 0 %
Lymphocytes Relative: 19 %
Lymphs Abs: 1.4 10*3/uL (ref 0.7–4.0)
MCH: 28.8 pg (ref 26.0–34.0)
MCHC: 34.1 g/dL (ref 30.0–36.0)
MCV: 84.5 fL (ref 80.0–100.0)
Monocytes Absolute: 0.7 10*3/uL (ref 0.1–1.0)
Monocytes Relative: 9 %
Neutro Abs: 5.2 10*3/uL (ref 1.7–7.7)
Neutrophils Relative %: 66 %
Platelets: 254 10*3/uL (ref 150–400)
RBC: 3.99 MIL/uL — ABNORMAL LOW (ref 4.22–5.81)
RDW: 13.9 % (ref 11.5–15.5)
WBC: 7.8 10*3/uL (ref 4.0–10.5)
nRBC: 0 % (ref 0.0–0.2)

## 2021-07-25 LAB — COMPREHENSIVE METABOLIC PANEL
ALT: 12 U/L (ref 0–44)
AST: 14 U/L — ABNORMAL LOW (ref 15–41)
Albumin: 3.3 g/dL — ABNORMAL LOW (ref 3.5–5.0)
Alkaline Phosphatase: 64 U/L (ref 38–126)
Anion gap: 10 (ref 5–15)
BUN: 22 mg/dL (ref 8–23)
CO2: 21 mmol/L — ABNORMAL LOW (ref 22–32)
Calcium: 9.1 mg/dL (ref 8.9–10.3)
Chloride: 106 mmol/L (ref 98–111)
Creatinine, Ser: 2.17 mg/dL — ABNORMAL HIGH (ref 0.61–1.24)
GFR, Estimated: 29 mL/min — ABNORMAL LOW (ref >60)
Glucose, Bld: 107 mg/dL — ABNORMAL HIGH (ref 70–99)
Potassium: 4.4 mmol/L (ref 3.5–5.1)
Sodium: 137 mmol/L (ref 135–145)
Total Bilirubin: 0.6 mg/dL (ref 0.3–1.2)
Total Protein: 7.2 g/dL (ref 6.5–8.1)

## 2021-07-25 LAB — TSH: TSH: 1.053 u[IU]/mL (ref 0.350–4.500)

## 2021-07-25 LAB — T4, FREE: Free T4: 1.19 ng/dL — ABNORMAL HIGH (ref 0.61–1.12)

## 2021-07-25 NOTE — ED Triage Notes (Signed)
Pt and family member reports having fatigue and generalized weakness x 1 week and not eating/drinking at baseline. Denies n/v/d or fever.

## 2021-07-25 NOTE — ED Provider Triage Note (Signed)
Emergency Medicine Provider Triage Evaluation Note  Stephen Chavez , a 88 y.o. male  was evaluated in triage.  Brought in by wife with concern for decrease in daily activity for the past 1 to 2 weeks.  Wife states patient gets out of bed and sits on the sofa all day.  Patient denies chest pain, shortness of breath, abdominal pain, changes in bowel or bladder habits, fevers or chills.  He has no complaints.  Has a history of anemia, wife called patient's hematologist who reported his most recent labs were reassuring.  Called primary care who recommended thyroid work-up.  No new or different symptoms today prompting visit so much as wife is concerned that he is less active.  Review of Systems  Positive: Less active Negative: Abd pain, chest pain, fever  Physical Exam  BP (!) 151/80 (BP Location: Right Arm)    Pulse 85    Temp 98.8 F (37.1 C) (Oral)    Resp 17    SpO2 95%  Gen:   Awake, no distress   Resp:  Normal effort  MSK:   Moves extremities without difficulty  Other:  Abd soft and non tender  Medical Decision Making  Medically screening exam initiated at 1:23 PM.  Appropriate orders placed.  Stephen Chavez was informed that the remainder of the evaluation will be completed by another provider, this initial triage assessment does not replace that evaluation, and the importance of remaining in the ED until their evaluation is complete.  Plan is to assess labs, if reassuring, may likely be dc to follow up with PCP for thyroid results.    Tacy Learn, PA-C 07/25/21 1333

## 2021-07-25 NOTE — ED Provider Notes (Signed)
[place] EMERGENCY DEPARTMENT  Provider Note  CSN: [id number] Arrival date & time: 07/25/21 1236  History Chief Complaint  Patient presents with   Weakness    Stephen Chavez is a 88 y.o. male with history of CKD, CAD, PVD brought to the ED with his wife who supplements history. He has had general weakness for the last week or two. No other specific symptoms but just gets tired easily, no energy, and decreased appetite. He was seen by Cardiology recently who did not feel his symptoms we cardiac related after negative workup. He denies any fever, CP, SOB, N/V/D or dysuria. He has had some nasal congestion but minimal cough.    Home Medications Prior to Admission medications   Medication Sig Start Date End Date Taking? Authorizing Provider  acetaminophen (TYLENOL) 160 MG/5ML suspension Take 240 mg by mouth every 6 (six) hours as needed for mild pain or headache.    [provider]  amLODipine (NORVASC) 5 MG tablet Take 5 mg by mouth daily.    [provider]  febuxostat (ULORIC) 40 MG tablet Take 40 mg by mouth daily.    [provider]  ferrous sulfate 325 (65 FE) MG tablet Take 325 mg by mouth daily with breakfast.    [provider]  fluticasone (FLONASE) 50 MCG/ACT nasal spray Place 2 sprays into both nostrils daily as needed for allergies or rhinitis.    [provider]  loratadine (CLARITIN) 10 MG tablet Take 10 mg by mouth at bedtime as needed for allergies or rhinitis.     [provider]  Multiple Vitamins-Minerals (CENTRUM SILVER) CHEW Chew 2 tablets by mouth daily.    [provider]  pantoprazole (PROTONIX) 40 MG tablet Take 1 tablet (40 mg total) by mouth daily. 04/01/19 05/01/19  Debbe Odea, MD  predniSONE (DELTASONE) 5 MG tablet See admin instructions. As directed 07/02/21   [provider]  Psyllium (METAMUCIL FIBER PO) Take by mouth See admin instructions. Mix 1 tablespoonful of  powder into 8 ounces of water or juice and drink once a day    [provider]  rosuvastatin (CRESTOR) 40 MG tablet Take 1 tablet (40 mg total) by mouth daily. 07/09/21 10/07/21  Skeet Latch, MD  senna (SENOKOT) 8.6 MG TABS tablet Take 1 tablet by mouth daily.    [provider]  traZODone (DESYREL) 50 MG tablet Take 150 mg by mouth at bedtime as needed. 08/08/19   [provider]     Allergies    Allopurinol and Colchicine   Review of Systems   Review of Systems Please see HPI for pertinent positives and negatives  Physical Exam BP 131/73    Pulse 76    Temp 98.8 F (37.1 C) (Oral)    Resp 20    SpO2 97%   Physical Exam Vitals and nursing note reviewed.  Constitutional:      Appearance: Normal appearance.  HENT:     Head: Normocephalic and atraumatic.     Nose: Nose normal.     Mouth/Throat:     Mouth: Mucous membranes are moist.  Eyes:     Extraocular Movements: Extraocular movements intact.     Conjunctiva/sclera: Conjunctivae normal.  Cardiovascular:     Rate and Rhythm: Normal rate.  Pulmonary:     Effort: Pulmonary effort is normal.     Breath sounds: Normal breath sounds.  Abdominal:     General: Abdomen is flat.     Palpations:  Abdomen is soft.     Tenderness: There is no abdominal tenderness.  Musculoskeletal:        General: No swelling. Normal range of motion.     Cervical back: Neck supple.  Skin:    General: Skin is warm and dry.  Neurological:     General: No focal deficit present.     Mental Status: He is alert.  Psychiatric:        Mood and Affect: Mood normal.    ED Results / Procedures / Treatments   EKG None  Procedures Procedures  Medications Ordered in the ED Medications - No data to display  Initial Impression and Plan  Patient with general weakness/malaise and decreased appetite. No other specific symptoms of concern, but was advised to have his thyroid checked. Labs done in triage show CBC with  baseline anemia, unchanged. CMP with CKD about at baseline. Normal UA and TSH in the normal range. Free T4 is mildly elevated of unclear significance. Patient does not have any indication for admission or further ED testing at this time. Encouraged to drink plenty of fluids, try supplemental milkshakes and followup with PCP next week. Patient and wife are in agreement with this plan.   ED Course       MDM Rules/Calculators/A&P Medical Decision Making Problems Addressed: Generalized weakness: undiagnosed new problem with uncertain prognosis  Amount and/or Complexity of Data Reviewed Labs: ordered. Decision-making details documented in ED Course.  Risk Decision regarding hospitalization.    Final Clinical Impression(s) / ED Diagnoses Final diagnoses:  Generalized weakness    Rx / DC Orders ED Discharge Orders     None        Truddie Hidden, MD 07/25/21 2055

## 2021-07-25 NOTE — ED Notes (Signed)
E-signature pad unavailable at time of pt discharge. This RN discussed discharge materials with pt and answered all pt questions. Pt stated understanding of discharge material. ? ?

## 2021-07-26 LAB — T4: T4, Total: 7.7 ug/dL (ref 4.5–12.0)

## 2021-07-26 LAB — T3: T3, Total: 79 ng/dL (ref 71–180)

## 2021-07-30 ENCOUNTER — Emergency Department (HOSPITAL_COMMUNITY): Payer: Medicare Other

## 2021-07-30 ENCOUNTER — Other Ambulatory Visit: Payer: Self-pay

## 2021-07-30 ENCOUNTER — Encounter (HOSPITAL_COMMUNITY): Payer: Self-pay | Admitting: Emergency Medicine

## 2021-07-30 ENCOUNTER — Observation Stay (HOSPITAL_COMMUNITY)
Admission: EM | Admit: 2021-07-30 | Discharge: 2021-08-01 | Disposition: A | Discharge status: Home / Self Care | Payer: Medicare Other | Attending: Internal Medicine | Admitting: Internal Medicine

## 2021-07-30 DIAGNOSIS — D509 Iron deficiency anemia, unspecified: Secondary | ICD-10-CM | POA: Diagnosis present

## 2021-07-30 DIAGNOSIS — D508 Other iron deficiency anemias: Secondary | ICD-10-CM

## 2021-07-30 DIAGNOSIS — R2689 Other abnormalities of gait and mobility: Secondary | ICD-10-CM | POA: Insufficient documentation

## 2021-07-30 DIAGNOSIS — I10 Essential (primary) hypertension: Secondary | ICD-10-CM

## 2021-07-30 DIAGNOSIS — I5042 Chronic combined systolic (congestive) and diastolic (congestive) heart failure: Secondary | ICD-10-CM | POA: Diagnosis not present

## 2021-07-30 DIAGNOSIS — I5032 Chronic diastolic (congestive) heart failure: Secondary | ICD-10-CM

## 2021-07-30 DIAGNOSIS — Z79899 Other long term (current) drug therapy: Secondary | ICD-10-CM | POA: Diagnosis not present

## 2021-07-30 DIAGNOSIS — N179 Acute kidney failure, unspecified: Principal | ICD-10-CM | POA: Insufficient documentation

## 2021-07-30 DIAGNOSIS — I2510 Atherosclerotic heart disease of native coronary artery without angina pectoris: Secondary | ICD-10-CM | POA: Diagnosis not present

## 2021-07-30 DIAGNOSIS — R7989 Other specified abnormal findings of blood chemistry: Secondary | ICD-10-CM | POA: Insufficient documentation

## 2021-07-30 DIAGNOSIS — I130 Hypertensive heart and chronic kidney disease with heart failure and stage 1 through stage 4 chronic kidney disease, or unspecified chronic kidney disease: Secondary | ICD-10-CM | POA: Insufficient documentation

## 2021-07-30 DIAGNOSIS — E782 Mixed hyperlipidemia: Secondary | ICD-10-CM | POA: Diagnosis present

## 2021-07-30 DIAGNOSIS — R531 Weakness: Secondary | ICD-10-CM | POA: Diagnosis present

## 2021-07-30 DIAGNOSIS — N1832 Chronic kidney disease, stage 3b: Secondary | ICD-10-CM | POA: Insufficient documentation

## 2021-07-30 DIAGNOSIS — Z20822 Contact with and (suspected) exposure to covid-19: Secondary | ICD-10-CM | POA: Insufficient documentation

## 2021-07-30 LAB — RESP PANEL BY RT-PCR (FLU A&B, COVID) ARPGX2
Influenza A by PCR: NEGATIVE
Influenza B by PCR: NEGATIVE
SARS Coronavirus 2 by RT PCR: NEGATIVE

## 2021-07-30 LAB — CBC WITH DIFFERENTIAL/PLATELET
Abs Immature Granulocytes: 0.02 10*3/uL (ref 0.00–0.07)
Basophils Absolute: 0 10*3/uL (ref 0.0–0.1)
Basophils Relative: 1 %
Eosinophils Absolute: 0.6 10*3/uL — ABNORMAL HIGH (ref 0.0–0.5)
Eosinophils Relative: 7 %
HCT: 34.1 % — ABNORMAL LOW (ref 39.0–52.0)
Hemoglobin: 11.1 g/dL — ABNORMAL LOW (ref 13.0–17.0)
Immature Granulocytes: 0 %
Lymphocytes Relative: 17 %
Lymphs Abs: 1.4 10*3/uL (ref 0.7–4.0)
MCH: 27.4 pg (ref 26.0–34.0)
MCHC: 32.6 g/dL (ref 30.0–36.0)
MCV: 84.2 fL (ref 80.0–100.0)
Monocytes Absolute: 0.6 10*3/uL (ref 0.1–1.0)
Monocytes Relative: 8 %
Neutro Abs: 5.3 10*3/uL (ref 1.7–7.7)
Neutrophils Relative %: 67 %
Platelets: 348 10*3/uL (ref 150–400)
RBC: 4.05 MIL/uL — ABNORMAL LOW (ref 4.22–5.81)
RDW: 13.7 % (ref 11.5–15.5)
WBC: 7.8 10*3/uL (ref 4.0–10.5)
nRBC: 0 % (ref 0.0–0.2)

## 2021-07-30 LAB — HEPATIC FUNCTION PANEL
ALT: 12 U/L (ref 0–44)
AST: 17 U/L (ref 15–41)
Albumin: 3.2 g/dL — ABNORMAL LOW (ref 3.5–5.0)
Alkaline Phosphatase: 65 U/L (ref 38–126)
Bilirubin, Direct: <0.1 mg/dL (ref 0.0–0.2)
Total Bilirubin: 0.4 mg/dL (ref 0.3–1.2)
Total Protein: 7.3 g/dL (ref 6.5–8.1)

## 2021-07-30 LAB — BASIC METABOLIC PANEL
Anion gap: 10 (ref 5–15)
Anion gap: 8 (ref 5–15)
BUN: 22 mg/dL (ref 8–23)
BUN: 22 mg/dL (ref 8–23)
CO2: 22 mmol/L (ref 22–32)
CO2: 23 mmol/L (ref 22–32)
Calcium: 9.6 mg/dL (ref 8.9–10.3)
Calcium: 8.8 mg/dL — ABNORMAL LOW (ref 8.9–10.3)
Chloride: 106 mmol/L (ref 98–111)
Chloride: 107 mmol/L (ref 98–111)
Creatinine, Ser: 2.41 mg/dL — ABNORMAL HIGH (ref 0.61–1.24)
Creatinine, Ser: 2.16 mg/dL — ABNORMAL HIGH (ref 0.61–1.24)
GFR, Estimated: 26 mL/min — ABNORMAL LOW (ref >60)
GFR, Estimated: 29 mL/min — ABNORMAL LOW (ref >60)
Glucose, Bld: 173 mg/dL — ABNORMAL HIGH (ref 70–99)
Glucose, Bld: 97 mg/dL (ref 70–99)
Potassium: 4.3 mmol/L (ref 3.5–5.1)
Potassium: 4.5 mmol/L (ref 3.5–5.1)
Sodium: 138 mmol/L (ref 135–145)
Sodium: 138 mmol/L (ref 135–145)

## 2021-07-30 LAB — URINALYSIS, ROUTINE W REFLEX MICROSCOPIC
Bilirubin Urine: NEGATIVE
Glucose, UA: NEGATIVE mg/dL
Hgb urine dipstick: NEGATIVE
Ketones, ur: NEGATIVE mg/dL
Leukocytes,Ua: NEGATIVE
Nitrite: NEGATIVE
Protein, ur: NEGATIVE mg/dL
Specific Gravity, Urine: 1.011 (ref 1.005–1.030)
pH: 5 (ref 5.0–8.0)

## 2021-07-30 MED ORDER — SODIUM CHLORIDE 0.9 % IV BOLUS
500.0000 mL | Freq: Once | INTRAVENOUS | Status: AC
  Administered 2021-07-30: 500 mL via INTRAVENOUS

## 2021-07-30 NOTE — ED Provider Notes (Signed)
[place] EMERGENCY DEPARTMENT Provider Note   CSN: [id number] Arrival date & time: 07/30/21  1008     History  Chief Complaint  Patient presents with   Weakness    Stephen Chavez is a 88 y.o. 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  Presents to the emergency department with a chief complaint of weakness, fatigue, and abnormal lab results.  Patient's wife reports that he saw his PCP yesterday and they performed lab testing.  They were contacted today by his PCP and told to come to the emergency department due to elevated kidney function.  Patient reports that he has been having generalized weakness fatigue over the last 3 to 4 weeks.  Symptoms have been worse over the last 5 days.  Patient has had decreased p.o. intake over this time.  Wife reports that patient had a Tmax of 99.6 F temp orally on Saturday and Sunday.  Patient has had nonproductive cough over the last week.     Weakness Associated symptoms: frequency   Associated symptoms: no abdominal pain, no chest pain, no dizziness, no dysuria, no fever, no headaches, no nausea, no shortness of breath and no vomiting       Home Medications Prior to Admission medications   Medication Sig Start Date End Date Taking? Authorizing Provider  acetaminophen (TYLENOL) 160 MG/5ML suspension Take 240 mg by mouth every 6 (six) hours as needed for mild pain or headache.    [provider]  amLODipine (NORVASC) 5 MG tablet Take 5 mg by mouth daily.    [provider]  febuxostat (ULORIC) 40 MG tablet Take 40 mg by mouth daily.    [provider]  ferrous sulfate 325 (65 FE) MG tablet Take 325 mg by mouth daily with breakfast.    [provider]  fluticasone (FLONASE) 50 MCG/ACT nasal spray Place 2 sprays into both nostrils daily as needed for allergies or  rhinitis.    [provider]  loratadine (CLARITIN) 10 MG tablet Take 10 mg by mouth at bedtime as needed for allergies or rhinitis.     [provider]  Multiple Vitamins-Minerals (CENTRUM SILVER) CHEW Chew 2 tablets by mouth daily.    [provider]  pantoprazole (PROTONIX) 40 MG tablet Take 1 tablet (40 mg total) by mouth daily. 04/01/19 05/01/19  Debbe Odea, MD  predniSONE (DELTASONE) 5 MG tablet See admin instructions. As directed 07/02/21   [provider]  Psyllium (METAMUCIL FIBER PO) Take by mouth See admin instructions. Mix 1 tablespoonful of powder into 8 ounces of water or juice and drink once a day    [provider]  rosuvastatin (CRESTOR) 40 MG tablet Take 1 tablet (40 mg total) by mouth daily. 07/09/21 10/07/21  Skeet Latch, MD  senna (SENOKOT) 8.6 MG TABS tablet Take 1 tablet by mouth daily.    [provider]  traZODone (DESYREL) 50 MG tablet Take 150 mg by mouth at bedtime as needed. 08/08/19   [provider]      Allergies    Allopurinol and Colchicine    Review of Systems   Review of Systems  Constitutional:  Positive for fatigue. Negative for chills and fever.  Eyes:  Negative for visual disturbance.  Respiratory:  Negative for shortness of breath.   Cardiovascular:  Negative for chest pain.  Gastrointestinal:  Negative for abdominal pain, nausea  and vomiting.  Genitourinary:  Positive for frequency. Negative for difficulty urinating, dysuria, flank pain, hematuria, penile discharge, penile pain, penile swelling, scrotal swelling and testicular pain.  Musculoskeletal:  Negative for back pain and neck pain.  Skin:  Negative for color change and rash.  Neurological:  Positive for weakness (Generalized). Negative for dizziness, syncope, light-headedness and headaches.  Psychiatric/Behavioral:  Negative for confusion.    Physical Exam Updated Vital Signs BP 130/70    Pulse 80    Temp 98.7 F (37.1 C)  (Oral)    Resp 18    SpO2 95%  Physical Exam Vitals and nursing note reviewed.  Constitutional:      General: He is not in acute distress.    Appearance: He is not ill-appearing, toxic-appearing or diaphoretic.  HENT:     Head: Normocephalic.  Eyes:     General: No scleral icterus.       Right eye: No discharge.        Left eye: No discharge.  Cardiovascular:     Rate and Rhythm: Normal rate.     Pulses:          Radial pulses are 2+ on the right side and 2+ on the left side.  Pulmonary:     Effort: Pulmonary effort is normal. No tachypnea, bradypnea or respiratory distress.     Breath sounds: Normal breath sounds. No stridor. No decreased breath sounds, wheezing, rhonchi or rales.  Abdominal:     General: Abdomen is flat. There is no distension. There are no signs of injury.     Tenderness: There is no abdominal tenderness. There is no guarding or rebound.     Hernia: There is no hernia in the umbilical area or ventral area.  Skin:    General: Skin is warm and dry.  Neurological:     General: No focal deficit present.     Mental Status: He is alert.     GCS: GCS eye subscore is 4. GCS verbal subscore is 5. GCS motor subscore is 6.     Comments: Moves all limbs equally without difficulty  Psychiatric:        Behavior: Behavior is cooperative.    ED Results / Procedures / Treatments   Labs (all labs ordered are listed, but only abnormal results are displayed) Labs Reviewed  CBC WITH DIFFERENTIAL/PLATELET - Abnormal; Notable for the following components:      Result Value   RBC 4.05 (*)    Hemoglobin 11.1 (*)    HCT 34.1 (*)    Eosinophils Absolute 0.6 (*)    All other components within normal limits  BASIC METABOLIC PANEL - Abnormal; Notable for the following components:   Glucose, Bld 173 (*)    Creatinine, Ser 2.41 (*)    GFR, Estimated 26 (*)    All other components within normal limits  HEPATIC FUNCTION PANEL - Abnormal; Notable for the following components:    Albumin 3.2 (*)    All other components within normal limits  RESP PANEL BY RT-PCR (FLU A&B, COVID) ARPGX2  URINALYSIS, ROUTINE W REFLEX MICROSCOPIC    EKG None  Radiology DG Chest 2 View  Result Date: 07/30/2021 CLINICAL DATA:  Weakness.  Decreased appetite. EXAM: CHEST - 2 VIEW COMPARISON:  05/17/2021 FINDINGS: Midline trachea. Normal heart size and mediastinal contours. No pleural effusion or pneumothorax. Numerous leads and wires project over the chest. There may be minimal left base scarring laterally. IMPRESSION: No acute process or explanation for weakness.  Electronically Signed   By: Abigail Miyamoto M.D.   On: 07/30/2021 12:44    Procedures Procedures    Medications Ordered in ED Medications  sodium chloride 0.9 % bolus 500 mL (0 mLs Intravenous Stopped 07/30/21 1344)    ED Course/ Medical Decision Making/ A&P                           Medical Decision Making Amount and/or Complexity of Data Reviewed Labs: ordered. Radiology: ordered.  Risk Decision regarding hospitalization.   Alert 88 year old male in no acute stress, nontoxic-appearing.  Presents emerged part with a chief complaint of normal labs, generalized weakness, and fatigue.  Information was obtained from patient and patient's wife at bedside.  Medical records were reviewed including previous provider notes, labs, and imaging.  Patient has past medical history as noted in HPI which complicates his care.  Due to reports of generalized fatigue and weakness will obtain CBC, BMP, hepatic function, urinalysis, respiratory panel, and chest x-ray to evaluate for possible infectious or metabolic cause.  Patient had recent TSH, free T4, and free T3 ordered and therefore this was not repeated today.  Lab work was independently reviewed myself.  Pertinent findings include: -Creatinine 2.41, this is up from 2.17 five days prior. -Anemia, appears at patient's baseline. -Respiratory panel negative for COVID-19 and  influenza -UA pending  Chest x-ray was independently reviewed myself and shows no acute cardiopulmonary abnormality.  Due to patient's AKI was given 500 mL fluid bolus in the emergency department.  Fluid was limited due to his history of CHF.  Due to worsening AKI despite outpatient treatment will consult hospitalist for admission.  Patient care transferred to [facility] at the end of my shift. Patient presentation, ED course, and plan of care discussed with review of all pertinent labs and imaging. Please see his/her note for further details regarding further ED course and disposition.          Final Clinical Impression(s) / ED Diagnoses Final diagnoses:  AKI (acute kidney injury) [facility])    Rx / DC Orders ED Discharge Orders     None         Dyann Ruddle 07/30/21 1542    Tegeler, Gwenyth Allegra, MD 07/31/21 [phone]

## 2021-07-30 NOTE — ED Notes (Signed)
Pt resting on stretcher with eyes closed, respirations even and unlabored. No acute changes noted. Wife at bedside. Will continue to monitor.

## 2021-07-30 NOTE — ED Provider Triage Note (Signed)
Emergency Medicine Provider Triage Evaluation Note  Stephen Chavez , a 88 y.o. male  was evaluated in triage.  Pt complains of general fatigue, slightly decreased appetite for a few days. No fever, chills, no chest pain, shortness of breath, nausea, vomiting.  Review of Systems  Positive: fatigue Negative: Chest pain, shortness of breath  Physical Exam  BP 125/76 (BP Location: Right Arm)    Pulse 94    Temp 98.7 F (37.1 C) (Oral)    Resp 14    SpO2 93%  Gen:   Awake, no distress   Resp:  Normal effort  MSK:   Moves extremities without difficulty  Other:  No ttp Abdomen  Medical Decision Making  Medically screening exam initiated at 11:14 AM.  Appropriate orders placed.  Stephen Chavez was informed that the remainder of the evaluation will be completed by another provider, this initial triage assessment does not replace that evaluation, and the importance of remaining in the ED until their evaluation is complete.  Sent here for elevated labs, on my investigation patient had creatinine of 2.17 up from 1.94 at last baseline.  He has not had any recent readings in our files.  No history of diabetes, kidney function presumably secondary to age/hypertension.   Stephen Chavez, [place] 07/30/21 1116

## 2021-07-30 NOTE — ED Provider Notes (Signed)
Care assumed from Parke Poisson at shift change, please see their note for full detail, but in brief Stephen Chavez is a 88 y.o. male presents with weakness.  Plan: Waiting for phone call for admit  Physical Exam  BP (!) 118/58    Pulse 73    Temp 99.5 F (37.5 C) (Rectal)    Resp 17    SpO2 95%   Physical Exam  Procedures  Procedures  ED Course / MDM    Medical Decision Making Amount and/or Complexity of Data Reviewed Labs: ordered. Radiology: ordered.  Risk Decision regarding hospitalization.   I spoke with the [facility] team who wants to reevaluate patient after his 500 mL fluid bolus to see if he has improved.  After recheck and monitoring, there was some mild improvement in creatinine, down from 2.46 to 2.16, but given that patient has continued weakness, inappetence, patient's wife is concerned that he will decline again if he returns home.  I once again consulted and spoke with Dr. Sherryll Burger who who agrees to admit patient.  Ultimate treatment and disposition to be decided by admitting team.       Janell Quiet, PA-C 07/30/21 2114    Benjiman Core, MD 07/30/21 2350

## 2021-07-30 NOTE — ED Triage Notes (Signed)
Wife stated, he has been weak for about a week and unable to have a good appetite. His Dr.'s office called and stated his labs were abnormal to do with his kidneys.

## 2021-07-30 NOTE — ED Notes (Signed)
Pt given sandwich bag and soda per request. No acute changes noted. Pt denies any complaints. Will continue to monitor.

## 2021-07-30 NOTE — H&P (Addendum)
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 emergency department with complaints of generalized weakness as well as concerns by patient's PCP for acute kidney injury.  Patient explains that approximately 2 weeks ago he  began to experience generalized weakness.  This was associated with a dry nonproductive cough and dyspnea on exertion.  Patient also complains of an some weight loss over the same span of time do to poor appetite.  Slightly over 1 week ago he has begun to develop generalized weakness.  This weakness initially was mild in intensity but progressively is becoming more and more severe.  Weakness is worse with exertion and improved with rest.  Patient complains of associated poor appetite over the span of time.  Patient denies any associated shortness of breath, chest pain, fever, sick contacts or any contacts with confirmed COVID-19 infection.  Due to progressively worsening symptoms patient presented to see his primary care provider on 2/13.  Today, patient was contacted by his primary care provider and told that his kidney function was abnormal and to come to the emergency department.  Upon evaluation in the emergency department creatinine has been noted to be 2.41, up from baseline of approximately 1.9 concerning for early acute kidney injury.  ER provider was concerned that patient  appeared to be volume depleted and initiated the patient on intravenous isotonic fluids.  Initial work-up for infection has been negative thus far.  Due to continued severe generalized weakness in the emergency department and need for continued intravenous fluids the hospitalist group has been called to assess patient for admission to the hospital.    Review of Systems: Review of Systems  Constitutional:  Positive for malaise/fatigue.  Neurological:  Positive for weakness.    Past Medical History:  Diagnosis Date   Amaurosis fugax of right eye 09/03/2016   CAD in native artery 07/05/2021   Carotid stenosis 09/03/2016   Mild 05/2016   Chronic combined systolic and diastolic heart failure ([place]) 10/23/2016   Essential hypertension 09/03/2016   Hyperlipidemia    Hypertension     Past Surgical History:  Procedure Laterality Date   BIOPSY  04/01/2019   Procedure: BIOPSY;  Surgeon: Otis Brace, MD;  Location: [place];  Service: Gastroenterology;;   COLONOSCOPY WITH PROPOFOL N/A 04/01/2019   Procedure: COLONOSCOPY WITH PROPOFOL;  Surgeon: Otis Brace, MD;  Location: [place];  Service: Gastroenterology;  Laterality: N/A;   ENTEROSCOPY N/A 04/01/2019   Procedure: Push ENTEROSCOPY;  Surgeon: Otis Brace, MD;  Location: [place] ENDOSCOPY;  Service: Gastroenterology;  Laterality: N/A;   EYE SURGERY     HOT HEMOSTASIS N/A 04/01/2019   Procedure: HOT HEMOSTASIS (ARGON PLASMA COAGULATION/BICAP);  Surgeon: Otis Brace, MD;  Location: [facility] ENDOSCOPY;  Service: Gastroenterology;  Laterality: N/A;   polyp removal     POLYPECTOMY  04/01/2019   Procedure: POLYPECTOMY;  Surgeon: Otis Brace, MD;  Location: [facility] ENDOSCOPY;  Service: Gastroenterology;;    Social History:  reports that he has never smoked. He has never used smokeless tobacco. He reports current alcohol use of about 4.0 standard drinks per week. He reports that he does not use drugs.  Allergies  Allergen  Reactions   Allopurinol Itching   Colchicine Itching    Family History  Problem Relation Age of Onset   Hypertension Mother     Prior to Admission medications   Medication Sig Start Date End Date Taking? Authorizing Provider  acetaminophen (TYLENOL) 160 MG/5ML suspension Take 240 mg by mouth every 6 (six) hours as needed for mild pain or headache.   Yes [provider]  amLODipine (NORVASC) 5 MG tablet Take 5 mg by mouth at bedtime.   Yes [provider]  cetirizine (ZYRTEC) 10 MG tablet Take 10 mg by mouth daily as needed for allergies or rhinitis. 02/01/21  Yes [provider]  febuxostat (ULORIC) 40 MG tablet Take 40 mg by mouth daily as needed (for gout flares).   Yes [provider]  ferrous sulfate 325 (65 FE) MG tablet Take 325 mg by mouth daily with breakfast.   Yes [provider]  METAMUCIL FIBER PO Take 12 g by mouth See admin instructions. Mix 12 grams of powder into 6-8 ounces of apple juice and drink once a day   Yes [provider]  mirtazapine (REMERON) 7.5 MG tablet Take 3.75-7.5 mg by mouth See admin instructions. Starting on 07/28/2021, take 3.75 mg by mouth at bedtime and increase to 7.5 mg after 1 week 07/28/21  Yes [provider]  [place] FAST-MAX CONGEST COUGH 2.5-5-100 MG/5ML LIQD Take 5 mLs by mouth every 12 (twelve) hours as needed (for coughing).   Yes [provider]  Multiple Vitamins-Minerals (CENTRUM SILVER) CHEW Chew 1-2 tablets by mouth daily with breakfast.   Yes [provider]  pantoprazole (PROTONIX) 40 MG tablet Take 1 tablet (40 mg total) by mouth daily. Patient taking differently: Take 40 mg by mouth daily before breakfast. 04/01/19 08/27/21 Yes Rizwan, Eunice Blase, MD  rosuvastatin (CRESTOR) 40 MG tablet Take 1 tablet (40 mg total) by mouth daily. Patient taking differently: Take 40 mg by mouth at bedtime. 07/09/21 10/07/21 Yes Skeet Latch, MD  senna (SENOKOT) 8.6 MG TABS  tablet Take 1 tablet by mouth daily.    [provider]    Physical Exam:  Vitals:   07/31/21 0430 07/31/21 0500 07/31/21 0600 07/31/21 0700  BP: 119/66 122/71 128/71 126/65  Pulse: 85 87 89 88  Resp: (!) 21 14 18 16   Temp:      TempSrc:      SpO2: 93% 94% 93% 95%    Constitutional: Awake alert and oriented x3, no associated distress.   Skin: no rashes, no lesions, poor skin turgor noted. Eyes: Pupils are equally reactive to light.  No evidence of scleral icterus or conjunctival pallor.  ENMT: Dry mucous membranes noted.  Posterior pharynx clear of any exudate or lesions.   Neck: normal, supple, no masses, no thyromegaly.  No evidence of jugular venous distension.   Respiratory: Scattered rhonchi bilaterally.  No evidence of wheezing.  No evidence of rales.  Normal respiratory effort. No accessory muscle use.  Cardiovascular: Regular rate and rhythm, no murmurs / rubs / gallops. No extremity edema. 2+ pedal pulses. No carotid bruits.  Chest:   Nontender without crepitus or deformity.   Back:   Nontender without crepitus or deformity. Abdomen: Abdomen is soft and nontender.  No evidence of intra-abdominal masses.  Positive  bowel sounds noted in all quadrants.   Musculoskeletal: No joint deformity upper and lower extremities. Good ROM, no contractures. Normal muscle tone.  Neurologic: CN 2-12 grossly intact. Sensation intact.  Patient moving all 4 extremities spontaneously.  Patient is following all commands.  Patient is responsive to verbal stimuli.   Psychiatric: Patient exhibits normal mood with appropriate affect.  Patient seems to possess insight as to their current situation.    Data Reviewed:  I have personally reviewed and interpreted labs, imaging.  Significant findings are:  COVID-19 PCR testing negative.   Creatinine noted to be 2.41.   Chest x-ray personally reviewed revealing no evidence of acute cardiopulmonary disease.   Urinalysis unremarkable.  EKG:  Personally reviewed.  Rhythm is normal sinus rhythm with heart rate of 92 bpm.  Left anterior fascicular block.  No dynamic ST segment changes appreciated.    Assessment and Plan: * Generalized weakness Patient complaining of severe progressive generalized weakness No focal neurologic deficit on exam to justify neuroimaging PT evaluation ordered Treating hypervolemia with intravenous volume resuscitation Additionally obtaining iron panel, vitamin B12, TSH, vitamin D  Acute renal failure superimposed on stage 3b chronic kidney disease ([place])- (present on admission) Patient exhibiting evidence of mild acute kidney injury secondary to volume depletion and poor oral intake Creatinine is currently 2.41 an increase compared to baseline of 1.9 Hydrating patient with intravenous isotonic fluids. Strict input and output monitoring Monitoring renal function and electrolytes with serial chemistries Avoiding nephrotoxic agents if at all possible   Elevated d-dimer- (present on admission) Patient exhibiting substantially elevated D-dimer of 2.72 Patient complaining of 2-week history of ongoing dyspnea with exertion and nonproductive cough Chest x-ray absolutely unremarkable Considering current renal function will obtain VQ scan and bilateral lower extremity ultrasound  Iron deficiency anemia- (present on admission) Longstanding history of anemia, with history of duodenal AVMs and gastric ulceration No clinical evidence of bleeding at this time Obtaining iron panel, folate, vitamin B12 Monitoring hemoglobin and hematocrit with serial CBCs  Chronic diastolic CHF (congestive heart failure) ([place])- (present on admission) No clinical evidence of cardiogenic volume overload   Essential hypertension- (present on admission) Resume patients home regimen of oral antihypertensives Titrate antihypertensive regimen as necessary to achieve adequate BP control PRN intravenous antihypertensives for  excessively elevated blood pressure    Mixed hyperlipidemia- (present on admission) Continuing home regimen of lipid lowering therapy.        Code Status:  Full code  code status decision has been confirmed with: patient Family Communication: deferred   Consults: None  Severity of Illness:  The appropriate patient status for this patient is OBSERVATION. Observation status is judged to be reasonable and necessary in order to provide the required intensity of service to ensure the patient's safety. The patient's presenting symptoms, physical exam findings, and initial radiographic and laboratory data in the context of their medical condition is felt to place them at decreased risk for further clinical deterioration. Furthermore, it is anticipated that the patient will be medically stable for discharge from the hospital within 2 midnights of admission.   Author:  Vernelle Emerald MD  07/31/2021 8:37 AM

## 2021-07-31 ENCOUNTER — Observation Stay (HOSPITAL_COMMUNITY): Payer: Medicare Other

## 2021-07-31 ENCOUNTER — Observation Stay (HOSPITAL_BASED_OUTPATIENT_CLINIC_OR_DEPARTMENT_OTHER): Payer: Medicare Other

## 2021-07-31 DIAGNOSIS — Z86711 Personal history of pulmonary embolism: Secondary | ICD-10-CM

## 2021-07-31 DIAGNOSIS — R7989 Other specified abnormal findings of blood chemistry: Secondary | ICD-10-CM | POA: Diagnosis not present

## 2021-07-31 DIAGNOSIS — R531 Weakness: Secondary | ICD-10-CM | POA: Diagnosis not present

## 2021-07-31 LAB — CBC WITH DIFFERENTIAL/PLATELET
Abs Immature Granulocytes: 0.01 10*3/uL (ref 0.00–0.07)
Basophils Absolute: 0 10*3/uL (ref 0.0–0.1)
Basophils Relative: 0 %
Eosinophils Absolute: 0.5 10*3/uL (ref 0.0–0.5)
Eosinophils Relative: 6 %
HCT: 34.3 % — ABNORMAL LOW (ref 39.0–52.0)
Hemoglobin: 11.8 g/dL — ABNORMAL LOW (ref 13.0–17.0)
Immature Granulocytes: 0 %
Lymphocytes Relative: 19 %
Lymphs Abs: 1.4 10*3/uL (ref 0.7–4.0)
MCH: 28.6 pg (ref 26.0–34.0)
MCHC: 34.4 g/dL (ref 30.0–36.0)
MCV: 83.1 fL (ref 80.0–100.0)
Monocytes Absolute: 0.7 10*3/uL (ref 0.1–1.0)
Monocytes Relative: 10 %
Neutro Abs: 4.8 10*3/uL (ref 1.7–7.7)
Neutrophils Relative %: 65 %
Platelets: 326 10*3/uL (ref 150–400)
RBC: 4.13 MIL/uL — ABNORMAL LOW (ref 4.22–5.81)
RDW: 13.6 % (ref 11.5–15.5)
WBC: 7.4 10*3/uL (ref 4.0–10.5)
nRBC: 0 % (ref 0.0–0.2)

## 2021-07-31 LAB — COMPREHENSIVE METABOLIC PANEL
ALT: 13 U/L (ref 0–44)
AST: 15 U/L (ref 15–41)
Albumin: 3.2 g/dL — ABNORMAL LOW (ref 3.5–5.0)
Alkaline Phosphatase: 63 U/L (ref 38–126)
Anion gap: 9 (ref 5–15)
BUN: 21 mg/dL (ref 8–23)
CO2: 25 mmol/L (ref 22–32)
Calcium: 9.4 mg/dL (ref 8.9–10.3)
Chloride: 105 mmol/L (ref 98–111)
Creatinine, Ser: 2.12 mg/dL — ABNORMAL HIGH (ref 0.61–1.24)
GFR, Estimated: 30 mL/min — ABNORMAL LOW (ref >60)
Glucose, Bld: 103 mg/dL — ABNORMAL HIGH (ref 70–99)
Potassium: 4.8 mmol/L (ref 3.5–5.1)
Sodium: 139 mmol/L (ref 135–145)
Total Bilirubin: 0.6 mg/dL (ref 0.3–1.2)
Total Protein: 7.1 g/dL (ref 6.5–8.1)

## 2021-07-31 LAB — D-DIMER, QUANTITATIVE: D-Dimer, Quant: 2.72 ug/mL-FEU — ABNORMAL HIGH (ref 0.00–0.50)

## 2021-07-31 LAB — VITAMIN B12: Vitamin B-12: 860 pg/mL (ref 180–914)

## 2021-07-31 LAB — TROPONIN I (HIGH SENSITIVITY): Troponin I (High Sensitivity): 12 ng/L (ref <18)

## 2021-07-31 LAB — FOLATE: Folate: 46.2 ng/mL (ref >5.9)

## 2021-07-31 LAB — IRON AND TIBC
Iron: 30 ug/dL — ABNORMAL LOW (ref 45–182)
Saturation Ratios: 12 % — ABNORMAL LOW (ref 17.9–39.5)
TIBC: 258 ug/dL (ref 250–450)
UIBC: 228 ug/dL

## 2021-07-31 LAB — MAGNESIUM: Magnesium: 2.2 mg/dL (ref 1.7–2.4)

## 2021-07-31 LAB — PHOSPHORUS: Phosphorus: 3.1 mg/dL (ref 2.5–4.6)

## 2021-07-31 LAB — VITAMIN D 25 HYDROXY (VIT D DEFICIENCY, FRACTURES): Vit D, 25-Hydroxy: 34.2 ng/mL (ref 30–100)

## 2021-07-31 MED ORDER — LORATADINE 10 MG PO TABS: 10.0000 mg | ORAL_TABLET | Freq: Every day | ORAL | Status: DC | PRN

## 2021-07-31 MED ORDER — ONDANSETRON HCL 4 MG/2ML IJ SOLN: 4.0000 mg | Freq: Four times a day (QID) | INTRAMUSCULAR | Status: DC | PRN

## 2021-07-31 MED ORDER — SENNA 8.6 MG PO TABS
1.0000 | ORAL_TABLET | Freq: Every day | ORAL | Status: DC
  Administered 2021-07-31 – 2021-08-01 (×2): 8.6 mg via ORAL
  Filled 2021-07-31 (×3): qty 1

## 2021-07-31 MED ORDER — HYDRALAZINE HCL 20 MG/ML IJ SOLN: 10.0000 mg | Freq: Four times a day (QID) | INTRAMUSCULAR | Status: DC | PRN

## 2021-07-31 MED ORDER — ONDANSETRON HCL 4 MG PO TABS: 4.0000 mg | ORAL_TABLET | Freq: Four times a day (QID) | ORAL | Status: DC | PRN

## 2021-07-31 MED ORDER — GUAIFENESIN-DM 100-10 MG/5ML PO SYRP: 5.0000 mL | ORAL_SOLUTION | Freq: Two times a day (BID) | ORAL | Status: DC | PRN

## 2021-07-31 MED ORDER — FERROUS SULFATE 325 (65 FE) MG PO TABS
325.0000 mg | ORAL_TABLET | Freq: Every day | ORAL | Status: DC
  Administered 2021-07-31: 325 mg via ORAL
  Filled 2021-07-31: qty 1

## 2021-07-31 MED ORDER — LACTATED RINGERS IV SOLN: INTRAVENOUS | Status: AC

## 2021-07-31 MED ORDER — SODIUM CHLORIDE 0.9 % IV SOLN
510.0000 mg | Freq: Once | INTRAVENOUS | Status: AC
  Administered 2021-07-31: 510 mg via INTRAVENOUS
  Filled 2021-07-31: qty 17

## 2021-07-31 MED ORDER — TECHNETIUM TO 99M ALBUMIN AGGREGATED
4.0000 | Freq: Once | INTRAVENOUS | Status: AC | PRN
  Administered 2021-07-31: 4 via INTRAVENOUS

## 2021-07-31 MED ORDER — AMLODIPINE BESYLATE 5 MG PO TABS
5.0000 mg | ORAL_TABLET | Freq: Every day | ORAL | Status: DC
  Administered 2021-07-31 (×2): 5 mg via ORAL
  Filled 2021-07-31 (×2): qty 1

## 2021-07-31 MED ORDER — POLYETHYLENE GLYCOL 3350 17 G PO PACK: 17.0000 g | PACK | Freq: Every day | ORAL | Status: DC | PRN

## 2021-07-31 MED ORDER — ACETAMINOPHEN 650 MG RE SUPP: 650.0000 mg | Freq: Four times a day (QID) | RECTAL | Status: DC | PRN

## 2021-07-31 MED ORDER — PANTOPRAZOLE SODIUM 40 MG PO TBEC
40.0000 mg | DELAYED_RELEASE_TABLET | Freq: Every day | ORAL | Status: DC
  Administered 2021-07-31 – 2021-08-01 (×2): 40 mg via ORAL
  Filled 2021-07-31 (×2): qty 1

## 2021-07-31 MED ORDER — ENOXAPARIN SODIUM 40 MG/0.4ML IJ SOSY
40.0000 mg | PREFILLED_SYRINGE | Freq: Every day | INTRAMUSCULAR | Status: DC
  Administered 2021-07-31: 40 mg via SUBCUTANEOUS
  Filled 2021-07-31: qty 0.4

## 2021-07-31 MED ORDER — ENOXAPARIN SODIUM 30 MG/0.3ML IJ SOSY
30.0000 mg | PREFILLED_SYRINGE | Freq: Every day | INTRAMUSCULAR | Status: DC
  Administered 2021-08-01: 30 mg via SUBCUTANEOUS
  Filled 2021-07-31: qty 0.3

## 2021-07-31 MED ORDER — ACETAMINOPHEN 325 MG PO TABS: 650.0000 mg | ORAL_TABLET | Freq: Four times a day (QID) | ORAL | Status: DC | PRN

## 2021-07-31 MED ORDER — ADULT MULTIVITAMIN W/MINERALS CH
1.0000 | ORAL_TABLET | Freq: Every day | ORAL | Status: DC
  Administered 2021-07-31 – 2021-08-01 (×2): 1 via ORAL
  Filled 2021-07-31 (×2): qty 1

## 2021-07-31 MED ORDER — PSYLLIUM 95 % PO PACK
1.0000 | PACK | Freq: Every day | ORAL | Status: DC
  Administered 2021-08-01: 1 via ORAL
  Filled 2021-07-31 (×2): qty 1

## 2021-07-31 MED ORDER — MIRTAZAPINE 15 MG PO TABS: 7.5000 mg | ORAL_TABLET | Freq: Every day | ORAL | Status: DC

## 2021-07-31 MED ORDER — ROSUVASTATIN CALCIUM 20 MG PO TABS
40.0000 mg | ORAL_TABLET | Freq: Every day | ORAL | Status: DC
  Administered 2021-07-31 (×2): 40 mg via ORAL
  Filled 2021-07-31 (×2): qty 2

## 2021-07-31 MED ORDER — FEBUXOSTAT 40 MG PO TABS
40.0000 mg | ORAL_TABLET | Freq: Every day | ORAL | Status: DC | PRN
  Filled 2021-07-31: qty 1

## 2021-07-31 MED ORDER — MIRTAZAPINE 7.5 MG PO TABS
3.7500 mg | ORAL_TABLET | Freq: Every day | ORAL | Status: DC
  Administered 2021-07-31 – 2021-08-01 (×2): 3.75 mg via ORAL
  Filled 2021-07-31 (×3): qty 1

## 2021-07-31 NOTE — ED Notes (Signed)
Breakfast Orders Placed °

## 2021-07-31 NOTE — Assessment & Plan Note (Signed)
•   No clinical evidence of cardiogenic volume overload ° °

## 2021-07-31 NOTE — Progress Notes (Signed)
PT Cancellation Note  Patient Details Name: Stephen Chavez MRN: [id number] DOB: [birth year]   Cancelled Treatment:    Reason Eval/Treat Not Completed: Patient not medically ready.  Results for LE DVT's not released and no results regarding potential PE.  Hold therapy for now.   Ivar Drape 07/31/2021, 10:25 AM  Samul Dada, PT PhD Acute Rehab Dept. Number: [facility] [id number] and [facility] [phone]

## 2021-07-31 NOTE — Progress Notes (Signed)
Stephen Chavez  [id number] DOB: Dec 24, 1932 DOA: 07/30/2021 PCP: Margretta Sidle, MD    Brief Narrative:  [phone] with a history of iron deficiency anemia, diastolic CHF, HTN, HLD, CKD stage IIIb, PE, and UGIB due to AVMs who presented to the ED with generalized weakness for 2 weeks.  He was seen by his PCP 2/13 and when labs returned revealing acute kidney injury he was directed to the ED.  In the ED he was found to have a creatinine of 2.4, compared to a baseline of approximately 1.9.  Consultants:  None  Code Status: FULL CODE  Antimicrobials:  None  DVT prophylaxis: SCDs  Interim Hx: Afebrile.  Vital signs stable.  Saturation 96% room air.  Resting comfortably on the hospital stretcher at the time of my visit.  Denies chest pain shortness of breath fever chills.  Reports that he feels better overall.  Assessment & Plan:  Generalized weakness B12 is 860 -vitamin D not low -folate normal -awaiting PT eval  Acute kidney injury on chronic kidney disease stage IIIb Baseline creatinine 1.9 -improving with gentle hydration  Recent Labs  Lab 07/25/21 1330 07/30/21 1119 07/30/21 1828 07/31/21 0003  CREATININE 2.17* 2.41* 2.16* 2.12*     Elevated D-dimer POA VQ scan and venous duplex without evidence of thrombosis  Chronic iron deficiency anemia Felt to be due to duodenal AVMs  Chronic diastolic CHF Not clinically overloaded at present  HTN Well-controlled at present  HLD Continue usual home medication   Family Communication:  Disposition: From home -anticipate discharge home 2/16  Objective: Blood pressure 126/65, pulse 88, temperature 98.7 F (37.1 C), temperature source Oral, resp. rate 16, SpO2 95 %.  Intake/Output Summary (Last 24 hours) at 07/31/2021 0941 Last data filed at 07/31/2021 0846 Gross per 24 hour  Intake 1500.7 ml  Output --  Net 1500.7 ml   There were no vitals filed for this visit.  Examination: General: No acute respiratory  distress Lungs: Clear to auscultation bilaterally without wheezes or crackles Cardiovascular: Regular rate and rhythm without murmur gallop or rub normal S1 and S2 Abdomen: Nontender, nondistended, soft, bowel sounds positive, no rebound, no ascites, no appreciable mass Extremities: No significant cyanosis, clubbing, or edema bilateral lower extremities  CBC: Recent Labs  Lab 07/25/21 1330 07/30/21 1119 07/31/21 0003  WBC 7.8 7.8 7.4  NEUTROABS 5.2 5.3 4.8  HGB 11.5* 11.1* 11.8*  HCT 33.7* 34.1* 34.3*  MCV 84.5 84.2 83.1  PLT 254 348 [id number]   Basic Metabolic Panel: Recent Labs  Lab 07/30/21 1119 07/30/21 1828 07/30/21 2341 07/31/21 0003  NA 138 138  --  139  K 4.3 4.5  --  4.8  CL 106 107  --  105  CO2 22 23  --  25  GLUCOSE 173* 97  --  103*  BUN 22 22  --  21  CREATININE 2.41* 2.16*  --  2.12*  CALCIUM 9.6 8.8*  --  9.4  MG  --   --  2.2  --   PHOS  --   --  3.1  --    GFR: Estimated Creatinine Clearance: 26.6 mL/min (A) (by C-G formula based on SCr of 2.12 mg/dL (H)).  Liver Function Tests: Recent Labs  Lab 07/25/21 1330 07/30/21 1119 07/31/21 0003  AST 14* 17 15  ALT 12 12 13   ALKPHOS 64 65 63  BILITOT 0.6 0.4 0.6  PROT 7.2 7.3 7.1  ALBUMIN 3.3* 3.2* 3.2*     Scheduled Meds:  amLODipine  5 mg Oral QHS   enoxaparin (LOVENOX) injection  40 mg Subcutaneous Daily   ferrous sulfate  325 mg Oral Q breakfast   mirtazapine  3.75 mg Oral QHS   [START ON 08/04/2021] mirtazapine  7.5 mg Oral QHS   multivitamin with minerals  1 tablet Oral Daily   pantoprazole  40 mg Oral QAC breakfast   psyllium  1 packet Oral Daily   rosuvastatin  40 mg Oral QHS   senna  1 tablet Oral Daily   Continuous Infusions:  lactated ringers Stopped (07/31/21 0846)     LOS: 0 days   Cherene Altes, MD [facility] Office  [phone] Pager - Text Page per Shea Evans  If 7PM-7AM, please contact night-coverage per Amion 07/31/2021, 9:41 AM

## 2021-07-31 NOTE — Assessment & Plan Note (Signed)
.   Continuing home regimen of lipid lowering therapy.  

## 2021-07-31 NOTE — Assessment & Plan Note (Signed)
•   Patient exhibiting evidence of mild acute kidney injury secondary to volume depletion and poor oral intake  Creatinine is currently 2.41 an increase compared to baseline of 1.9  Hydrating patient with intravenous isotonic fluids.  Strict input and output monitoring  Monitoring renal function and electrolytes with serial chemistries  Avoiding nephrotoxic agents if at all possible

## 2021-07-31 NOTE — Assessment & Plan Note (Signed)
.   Resume patients home regimen of oral antihypertensives . Titrate antihypertensive regimen as necessary to achieve adequate BP control . PRN intravenous antihypertensives for excessively elevated blood pressure   

## 2021-07-31 NOTE — ED Notes (Signed)
Pt resting on stretcher with eyes closed, respirations even and unlabored. No acute changes noted. Will continue to monitor. Family at bedside.

## 2021-07-31 NOTE — Assessment & Plan Note (Signed)
·   Longstanding history of anemia, with history of duodenal AVMs and gastric ulceration  No clinical evidence of bleeding at this time  Obtaining iron panel, folate, vitamin B12  Monitoring hemoglobin and hematocrit with serial CBCs

## 2021-07-31 NOTE — Assessment & Plan Note (Addendum)
·   Patient complaining of severe progressive generalized weakness  No focal neurologic deficit on exam to justify neuroimaging  PT evaluation ordered  Treating hypervolemia with intravenous volume resuscitation  Additionally obtaining iron panel, vitamin B12, TSH, vitamin D

## 2021-07-31 NOTE — Progress Notes (Signed)
NEW ADMISSION NOTE New Admission Note:   Arrival Method: stretcher Mental Orientation: A&O X4 Telemetry: [id number] Assessment: Completed Skin: intact dry flaky feet cracking  IV: RFA Pain: 0/10 Tubes: none Safety Measures: Safety Fall Prevention Plan has been given, discussed and signed Admission: Completed [facility] [place] Orientation: Patient has been orientated to the room, unit and staff.  Family: none at bedside  Orders have been reviewed and implemented. Will continue to monitor the patient. Call light has been placed within reach and bed alarm has been activated.   Keanen Dohse S Derral Colucci, RN

## 2021-07-31 NOTE — Care Management Obs Status (Signed)
MEDICARE OBSERVATION STATUS NOTIFICATION   Patient Details  Name: Stephen Chavez MRN: [id number] Date of Birth: 10/18/1932   Medicare Observation Status Notification Given:  Yes    Tom-Johnson, Hershal Coria, RN 07/31/2021, 5:09 PM

## 2021-07-31 NOTE — Evaluation (Signed)
Physical Therapy Evaluation Patient Details Name: Stephen Chavez MRN: [id number] DOB: 03-May-1933 Today's Date: 07/31/2021  History of Present Illness  88 yo male cleared for PE and DVT was admitted 2/14, continues to have issues feeling weak.  Also was concerning about AKI, after history of cough, SOB with exertion and wgt loss.  PMHx:  anemia, CHF, HTN, HLD, CKD3b, GI bleed, AVM's in duodenum and gastric ulcers, PE,  Clinical Impression  Pt was seen for initial walk on hallway with no AD needed, but demonstrates minor loss of endurance and fatigue to move.  Pt maintained HR and O2 sats in normal range, no signs of dizziness or light headed feelings.  Recommend PT see just to work on standing and gait endurance, and if stairs are done may not need further skilled care to work with him in [place].  No follow up PT should be needed, unless the stairs present a greater challenge than anticipated.       Recommendations for follow up therapy are one component of a multi-disciplinary discharge planning process, led by the attending physician.  Recommendations may be updated based on patient status, additional functional criteria and insurance authorization.  Follow Up Recommendations No PT follow up    Assistance Recommended at Discharge Set up Supervision/Assistance  Patient can return home with the following  Help with stairs or ramp for entrance;Assistance with cooking/housework    Equipment Recommendations None recommended by PT  Recommendations for Other Services       Functional Status Assessment Patient has had a recent decline in their functional status and demonstrates the ability to make significant improvements in function in a reasonable and predictable amount of time.     Precautions / Restrictions Precautions Precautions: Fall Precaution Comments: monitor HR and sats Restrictions Weight Bearing Restrictions: No      Mobility  Bed Mobility Overal bed mobility: Needs  Assistance Bed Mobility: Supine to Sit, Sit to Supine     Supine to sit: Min guard Sit to supine: Min guard   General bed mobility comments: min guard for safety    Transfers Overall transfer level: Needs assistance Equipment used: None Transfers: Sit to/from Stand Sit to Stand: Supervision           General transfer comment: supervised due to lines and monitoring vitals    Ambulation/Gait Ambulation/Gait assistance: Supervision Gait Distance (Feet): 150 Feet Assistive device: 1 person hand held assist Gait Pattern/deviations: Step-through pattern, Decreased stride length, Wide base of support Gait velocity: reduced Gait velocity interpretation: <1.31 ft/sec, indicative of household ambulator Pre-gait activities: standing balance training General Gait Details: pt is walking with minor changes but looks to be close to baseline  Stairs            Wheelchair Mobility    Modified Rankin (Stroke Patients Only)       Balance Overall balance assessment: Modified Independent                                           Pertinent Vitals/Pain Pain Assessment Pain Assessment: No/denies pain    Home Living Family/patient expects to be discharged to:: Private residence Living Arrangements: Spouse/significant other                 Additional Comments: pt has been home with wife, 2 STE and L  side railing    Prior Function Prior Level  of Function : Independent/Modified Independent             Mobility Comments: no falls, used no AD       Hand Dominance   Dominant Hand: Right    Extremity/Trunk Assessment   Upper Extremity Assessment Upper Extremity Assessment: Overall WFL for tasks assessed    Lower Extremity Assessment Lower Extremity Assessment: Generalized weakness (minor hip and knee weakness)    Cervical / Trunk Assessment Cervical / Trunk Assessment: Kyphotic (mild)  Communication   Communication: No difficulties   Cognition Arousal/Alertness: Awake/alert Behavior During Therapy: WFL for tasks assessed/performed Overall Cognitive Status: Within Functional Limits for tasks assessed                                 General Comments: pt is getting up to walk with help, supervised him and pt is demosntrating no loss of balance but is fatigued with the effort        General Comments General comments (skin integrity, edema, etc.): pt is getting up to walk with help of PT to supervise and is not especially unsafe, just quickly tired    Exercises     Assessment/Plan    PT Assessment Patient needs continued PT services  PT Problem List Decreased strength;Decreased activity tolerance;Cardiopulmonary status limiting activity       PT Treatment Interventions Gait training;Stair training;Functional mobility training;Therapeutic activities;Therapeutic exercise;Balance training;Neuromuscular re-education;Patient/family education    PT Goals (Current goals can be found in the Care Plan section)  Acute Rehab PT Goals Patient Stated Goal: to get home with wife and feel better PT Goal Formulation: With patient Time For Goal Achievement: 08/05/21 Potential to Achieve Goals: Good    Frequency Min 3X/week     Co-evaluation               AM-PAC PT "6 Clicks" Mobility  Outcome Measure Help needed turning from your back to your side while in a flat bed without using bedrails?: None Help needed moving from lying on your back to sitting on the side of a flat bed without using bedrails?: None Help needed moving to and from a bed to a chair (including a wheelchair)?: A Little Help needed standing up from a chair using your arms (e.g., wheelchair or bedside chair)?: A Little Help needed to walk in hospital room?: A Little Help needed climbing 3-5 steps with a railing? : A Little 6 Click Score: 20    End of Session Equipment Utilized During Treatment: Gait belt Activity Tolerance: Patient  limited by fatigue Patient left: in bed;with call bell/phone within reach Nurse Communication: Mobility status PT Visit Diagnosis: Other abnormalities of gait and mobility (R26.89);Muscle weakness (generalized) (M62.81)    Time: [id number] PT Time Calculation (min) (ACUTE ONLY): 14 min   Charges:   PT Evaluation $PT Eval Moderate Complexity: 1 Mod         Ramond Dial 07/31/2021, 7:56 PM  Mee Hives, PT PhD Acute Rehab Dept. Number: [place] [facility] [place]

## 2021-07-31 NOTE — Progress Notes (Addendum)
Lower extremity venous bilateral study completed.   Please see CV Proc for preliminary results.   Barbie Croston, RDMS, RVT  

## 2021-07-31 NOTE — Assessment & Plan Note (Signed)
·   Patient exhibiting substantially elevated D-dimer of 2.72  Patient complaining of 2-week history of ongoing dyspnea with exertion and nonproductive cough  Chest x-ray absolutely unremarkable  Considering current renal function will obtain VQ scan and bilateral lower extremity ultrasound

## 2021-08-01 DIAGNOSIS — R531 Weakness: Secondary | ICD-10-CM | POA: Diagnosis not present

## 2021-08-01 LAB — COMPREHENSIVE METABOLIC PANEL
ALT: 11 U/L (ref 0–44)
AST: 17 U/L (ref 15–41)
Albumin: 2.9 g/dL — ABNORMAL LOW (ref 3.5–5.0)
Alkaline Phosphatase: 59 U/L (ref 38–126)
Anion gap: 11 (ref 5–15)
BUN: 18 mg/dL (ref 8–23)
CO2: 23 mmol/L (ref 22–32)
Calcium: 9.5 mg/dL (ref 8.9–10.3)
Chloride: 104 mmol/L (ref 98–111)
Creatinine, Ser: 2.05 mg/dL — ABNORMAL HIGH (ref 0.61–1.24)
GFR, Estimated: 31 mL/min — ABNORMAL LOW (ref >60)
Glucose, Bld: 100 mg/dL — ABNORMAL HIGH (ref 70–99)
Potassium: 4.6 mmol/L (ref 3.5–5.1)
Sodium: 138 mmol/L (ref 135–145)
Total Bilirubin: 0.6 mg/dL (ref 0.3–1.2)
Total Protein: 6.8 g/dL (ref 6.5–8.1)

## 2021-08-01 LAB — CBC
HCT: 31.4 % — ABNORMAL LOW (ref 39.0–52.0)
Hemoglobin: 11 g/dL — ABNORMAL LOW (ref 13.0–17.0)
MCH: 28.6 pg (ref 26.0–34.0)
MCHC: 35 g/dL (ref 30.0–36.0)
MCV: 81.8 fL (ref 80.0–100.0)
Platelets: 332 10*3/uL (ref 150–400)
RBC: 3.84 MIL/uL — ABNORMAL LOW (ref 4.22–5.81)
RDW: 13.6 % (ref 11.5–15.5)
WBC: 7.4 10*3/uL (ref 4.0–10.5)
nRBC: 0 % (ref 0.0–0.2)

## 2021-08-01 MED ORDER — FERROUS SULFATE 325 (65 FE) MG PO TABS: 325.0000 mg | ORAL_TABLET | Freq: Every day | ORAL | 3 refills | Status: AC

## 2021-08-01 NOTE — Discharge Summary (Signed)
DISCHARGE SUMMARY  Stephen Chavez  MR#: [id number]  DOB:1932-09-03  Date of Admission: 07/30/2021 Date of Discharge: 08/01/2021  Attending Physician:Sparsh Callens Hennie Duos, MD  Patient's NF:[id number], Stephen Flavors, MD  Consults: none   Disposition: D/C home   Follow-up Appts:  Follow-up Information     Margretta Sidle, MD Follow up in 1 week(s).   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  He was seen by his PCP 2/13 and when labs returned revealing acute kidney injury he was directed to the ED.  In the ED he was found to have a creatinine of 2.4, compared to a baseline of approximately 1.9.  Hospital Course:  Generalized weakness B12 is 860 -vitamin D not low -folate normal -the patient actually did quite well with physical therapy -this appears to be primarily related to dehydration, poor dietary intake, and likely clinical depression related to the loss of his son a few months ago -I have spoken to him at great length concerning his need to push physical activity, to focus primarily on casual walking -I have encouraged him to monitor and assure consistent intake of water on a daily basis -I have encouraged him to push his intake of nutritious foods and supplement with over-the-counter meal supplements as necessary to include Ensure boost or the like -should this proved to not be successful over the next 2 to 3 weeks consideration could be given to initiating an antidepressant -the patient was COVID and influenza negative   Acute kidney injury on chronic  kidney disease stage IIIb Baseline creatinine 1.9 -improved with gentle hydration -likely simply related to poor intake  Elevated D-dimer POA VQ scan and venous duplex without evidence of thrombosis   Chronic iron deficiency anemia 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   amLODipine 5 MG tablet Commonly known as: NORVASC Take 5 mg by mouth at bedtime.   Centrum Silver Chew Chew 1-2 tablets by mouth daily with breakfast.   cetirizine 10 MG tablet Commonly known as: ZYRTEC Take 10 mg by mouth daily as needed for allergies or rhinitis.   febuxostat 40 MG tablet Commonly known as: ULORIC Take 40 mg by mouth daily as needed (for gout flares).   ferrous sulfate 325 (65 FE) MG tablet Take 1 tablet (325 mg total) by mouth daily with breakfast. Start taking on: August 12, 2021 What changed: These instructions start on August 12, 2021. If you are unsure what  to do until then, ask your doctor or other care provider.   METAMUCIL FIBER PO Take 12 g by mouth See admin instructions. Mix 12 grams of powder into 6-8 ounces of apple juice and drink once a day   mirtazapine 7.5 MG tablet Commonly known as: REMERON Take 3.75-7.5 mg by mouth See admin instructions. Starting on 07/28/2021, take 3.75 mg by mouth at bedtime and increase to 7.5 mg after 1 week   Mucinex Fast-Max Congest Cough 2.5-5-100 MG/5ML Liqd Generic drug: Phenylephrine-DM-GG Take 5  mLs by mouth every 12 (twelve) hours as needed (for coughing).   pantoprazole 40 MG tablet Commonly known as: Protonix Take 1 tablet (40 mg total) by mouth daily. What changed: when to take this   rosuvastatin 40 MG tablet Commonly known as: CRESTOR Take 1 tablet (40 mg total) by mouth daily. What changed: when to take this   senna 8.6 MG Tabs tablet Commonly known as: SENOKOT Take 1 tablet by mouth daily.        Day of Discharge BP 108/66    Pulse 87    Temp 98.5 F (36.9 C) (Oral)    Resp 18    Ht 6' (1.829 m)    Wt 72.3 kg    SpO2 94%    BMI 21.62 kg/m   Physical Exam: General: No acute respiratory distress Lungs: Clear to auscultation bilaterally without wheezes or crackles Cardiovascular: Regular rate and rhythm without murmur gallop or rub normal S1 and S2 Abdomen: Nontender, nondistended, soft, bowel sounds positive, no rebound, no ascites, no appreciable mass Extremities: No significant cyanosis, clubbing, or edema bilateral lower extremities  Basic Metabolic Panel: Recent Labs  Lab 07/25/21 1330 07/30/21 1119 07/30/21 1828 07/30/21 2341 07/31/21 0003 08/01/21 0220  NA 137 138 138  --  139 138  K 4.4 4.3 4.5  --  4.8 4.6  CL 106 106 107  --  105 104  CO2 21* 22 23  --  25 23  GLUCOSE 107* 173* 97  --  103* 100*  BUN 22 22 22   --  21 18  CREATININE 2.17* 2.41* 2.16*  --  2.12* 2.05*  CALCIUM 9.1 9.6 8.8*  --  9.4 9.5  MG  --   --   --  2.2  --   --   PHOS  --   --   --  3.1  --   --     Liver Function Tests: Recent Labs  Lab 07/25/21 1330 07/30/21 1119 07/31/21 0003 08/01/21 0220  AST 14* 17 15 17   ALT 12 12 13 11   ALKPHOS 64 65 63 59  BILITOT 0.6 0.4 0.6 0.6  PROT 7.2 7.3 7.1 6.8  ALBUMIN 3.3* 3.2* 3.2* 2.9*    CBC: Recent Labs  Lab 07/25/21 1330 07/30/21 1119 07/31/21 0003 08/01/21 0220  WBC 7.8 7.8 7.4 7.4  NEUTROABS 5.2 5.3 4.8  --   HGB 11.5* 11.1* 11.8* 11.0*  HCT 33.7* 34.1* 34.3* 31.4*  MCV 84.5 84.2 83.1 81.8  PLT 254 348  326 332    Time spent in discharge (includes decision making & examination of pt): 30 minutes  08/01/2021, 10:32 AM   Cherene Altes, MD [facility] Office  [phone]

## 2021-08-01 NOTE — Progress Notes (Signed)
DISCHARGE NOTE HOME Stephen Chavez to be discharged Home per MD order. Discussed prescriptions and follow up appointments with the patient. Prescriptions given to patient; medication list explained in detail. Patient verbalized understanding.  Skin clean, dry and intact without evidence of skin break down, no evidence of skin tears noted. IV catheter discontinued intact. Site without signs and symptoms of complications. Dressing and pressure applied. Pt denies pain at the site currently. No complaints noted.  Patient free of lines, drains, and wounds.   An After Visit Summary (AVS) was printed and given to the patient. Patient escorted via wheelchair, and discharged home via private auto.  Chandan Fly S Oryon Gary, RN

## 2021-08-01 NOTE — Progress Notes (Signed)
°  Transition of Care [facility]) Screening Note   Patient Details  Name: Stephen Chavez Date of Birth: 08/10/1932   Transition of Care [facility]) CM/SW Contact:    Tom-Johnson, Hershal Coria, RN Phone Number: 08/01/2021, 11:23 AM  Patient is scheduled for discharge today. Admitted for Generalized Weakness. From home with wife and has nine children whom are supportive with his care. Independent with care and drive self prior to admission. Has a cane, walker and shower seat at home. PCP is [name], MD and uses [facility]. Transition of Care Department [facility]) has reviewed patient and no TOC needs or recommendations have been identified at this time. Denies any needs. Wife to transport at discharge. No further TOC needs noted.

## 2021-08-12 NOTE — Telephone Encounter (Signed)
Patient has been seen hospitalized since labs done

## 2021-10-22 ENCOUNTER — Ambulatory Visit (HOSPITAL_BASED_OUTPATIENT_CLINIC_OR_DEPARTMENT_OTHER): Payer: Medicare Other | Admitting: Cardiovascular Disease

## 2021-11-13 ENCOUNTER — Ambulatory Visit (HOSPITAL_BASED_OUTPATIENT_CLINIC_OR_DEPARTMENT_OTHER): Payer: Medicare Other | Admitting: Family

## 2021-11-13 VITALS — BP 122/60 | HR 68 | Ht 72.0 in | Wt 165.5 lb

## 2021-11-13 DIAGNOSIS — N1832 Chronic kidney disease, stage 3b: Secondary | ICD-10-CM

## 2021-11-13 DIAGNOSIS — E785 Hyperlipidemia, unspecified: Secondary | ICD-10-CM | POA: Diagnosis not present

## 2021-11-13 DIAGNOSIS — I10 Essential (primary) hypertension: Secondary | ICD-10-CM | POA: Diagnosis not present

## 2021-11-13 DIAGNOSIS — I25118 Atherosclerotic heart disease of native coronary artery with other forms of angina pectoris: Secondary | ICD-10-CM | POA: Diagnosis not present

## 2021-11-13 NOTE — Progress Notes (Unsigned)
Office Visit    Patient Name: Stephen Chavez Date of Encounter: 11/15/2021  PCP:  Margretta Sidle, [facility]  Cardiologist:  Skeet Latch, MD  Advanced Practice Provider:  No care team member to display Electrophysiologist:  None      Chief Complaint    Stephen Chavez is a 88 y.o. male with a hx of CAD, HTN, CKD3b, HLD presents today for hospital follow-up  Past Medical History    Past Medical History:  Diagnosis Date   Amaurosis fugax of right eye 09/03/2016   CAD in native artery 07/05/2021   Carotid stenosis 09/03/2016   Mild 05/2016   Chronic combined systolic and diastolic heart failure ([place]) 10/23/2016   Essential hypertension 09/03/2016   Hyperlipidemia    Hypertension    Past Surgical History:  Procedure Laterality Date   BIOPSY  04/01/2019   Procedure: BIOPSY;  Surgeon: Otis Brace, MD;  Location: [place];  Service: Gastroenterology;;   COLONOSCOPY WITH PROPOFOL N/A 04/01/2019   Procedure: COLONOSCOPY WITH PROPOFOL;  Surgeon: Otis Brace, MD;  Location: [place];  Service: Gastroenterology;  Laterality: N/A;   ENTEROSCOPY N/A 04/01/2019   Procedure: Push ENTEROSCOPY;  Surgeon: Otis Brace, MD;  Location: [place] [facility];  Service: Gastroenterology;  Laterality: N/A;   EYE SURGERY     HOT HEMOSTASIS N/A 04/01/2019   Procedure: HOT HEMOSTASIS (ARGON PLASMA COAGULATION/BICAP);  Surgeon: Otis Brace, MD;  Location: [facility];  Service: Gastroenterology;  Laterality: N/A;   polyp removal     POLYPECTOMY  04/01/2019   Procedure: POLYPECTOMY;  Surgeon: Otis Brace, MD;  Location: [place] [facility]COPY;  Service: Gastroenterology;;    Allergies  Allergies  Allergen Reactions   Allopurinol Itching   Colchicine Itching    History of Present Illness    Stephen Chavez is a 88 y.o. male with a hx of chronic systolic and diastolic heart failure, mild carotid stenosis, CKD 3, amaurosis fugax, chronic anemia  secondary to GI AVM, hypertension last seen 07/05/2021 by Dr. Oval Linsey.  Prior transient monocular visual loss right eye 05/2016 which lasted several minutes which has recurred 3-4 times.  No pathology noted by ophthalmologist.  Felt to be due to amaurosis fugax and TIA evaluation recommended.  Carotid Doppler revealed mild carotid stenosis bilaterally.  Aspirin increased to 325 mg daily.  Referred to cardiology for evaluation with echo bubble 2017 EF 45-50% with diffuse hypokinesis and grade 1 diastolic dysfunction, moderate TR, negative for atrial shunting.  3-day monitor with no significant arrhythmias.  BP poorly controlled amlodipine increased.  He was subsequently lost to follow-up until 07/05/2021.  He did have echocardiogram in the interim 06/2018 with EF 60 to 123456, grade 1 diastolic dysfunction, PASP 40 mmHg.  He had bilateral PE 06/2018.  He was noted calcification of the LAD on CT chest at that time.  Last seen in clinic 07/05/2021.  Repeat echocardiogram ordered to assess shortness of breath and lower extremity edema. It showed normal LVEF 55-60%, severe asymmetric LVH, no RWMA, gr1DD, trivial MR/AI. Myoview ordered to assess for ischemia which was low risk.   Subsequently admitted 2/14 - 08/01/2021 with generalized weakness.  Appear to be primarily related to dehydration, poor dietary intake, likely clinical depression related to loss of son a few months prior.  He did have AKI.  VQ scan and venous duplex without evidence of thrombosis despite elevated D-dimer.  He was provided IV iron in hospital stay due to chronic iron deficiency anemia.  Presents today for follow up with his wife. his chief complaint today is swelling in his right hand which started 5 days ago. Seeing provider tomorrow regarding gout which he has had flares of before. No chest pain, dyspnea, lightheadedness, LE edema, orthopnea, PND. Energy level slowly improving. Wife notes he sits most of the day. They did have a camping trip  with friends but notes most friends live out of town. Encouraged to try classes or club at [facility] locally.   EKGs/Labs/Other Studies Reviewed:   The following studies were reviewed today:   EKG:  No EKG today.   Recent Labs: 07/25/2021: TSH 1.053 07/30/2021: Magnesium 2.2 08/01/2021: ALT 11; BUN 18; Creatinine, Ser 2.05; Hemoglobin 11.0; Platelets 332; Potassium 4.6; Sodium 138  Recent Lipid Panel No results found for: CHOL, TRIG, HDL, CHOLHDL, VLDL, LDLCALC, LDLDIRECT    Home Medications   Current Meds  Medication Sig   acetaminophen (TYLENOL) 160 MG/5ML suspension Take 240 mg by mouth every 6 (six) hours as needed for mild pain or headache.   amLODipine (NORVASC) 5 MG tablet Take 5 mg by mouth at bedtime.   cetirizine (ZYRTEC) 10 MG tablet Take 10 mg by mouth daily as needed for allergies or rhinitis.   febuxostat (ULORIC) 40 MG tablet Take 40 mg by mouth daily as needed (for gout flares).   ferrous sulfate 325 (65 FE) MG tablet Take 1 tablet (325 mg total) by mouth daily with breakfast.   METAMUCIL FIBER PO Take 12 g by mouth See admin instructions. Mix 12 grams of powder into 6-8 ounces of apple juice and drink once a day   mirtazapine (REMERON) 7.5 MG tablet Take 3.75-7.5 mg by mouth See admin instructions. Starting on 07/28/2021, take 3.75 mg by mouth at bedtime and increase to 7.5 mg after 1 week   MUCINEX FAST-MAX CONGEST COUGH 2.5-5-100 MG/5ML LIQD Take 5 mLs by mouth every 12 (twelve) hours as needed (for coughing).   Multiple Vitamins-Minerals (CENTRUM SILVER) CHEW Chew 1-2 tablets by mouth daily with breakfast.   pantoprazole (PROTONIX) 40 MG tablet Take 1 tablet (40 mg total) by mouth daily. (Patient taking differently: Take 40 mg by mouth daily before breakfast.)   rosuvastatin (CRESTOR) 40 MG tablet Take 1 tablet (40 mg total) by mouth daily. (Patient taking differently: Take 40 mg by mouth at bedtime.)   senna (SENOKOT) 8.6 MG TABS tablet Take 1 tablet  by mouth daily.     Review of Systems      All other systems reviewed and are otherwise negative except as noted above.  Physical Exam    VS:  BP 122/60 (BP Location: Left Arm, Patient Position: Sitting, Cuff Size: Normal)   Pulse 68   Ht 6' (1.829 m)   Wt 165 lb 8 oz (75.1 kg)   SpO2 95%   BMI 22.45 kg/m  , BMI Body mass index is 22.45 kg/m.  Wt Readings from Last 3 Encounters:  11/13/21 165 lb 8 oz (75.1 kg)  07/31/21 159 lb 6.3 oz (72.3 kg)  07/22/21 174 lb (78.9 kg)     GEN: Well nourished, well developed, in no acute distress. HEENT: normal. Neck: Supple, no JVD, carotid bruits, or masses. Cardiac: RRR, no murmurs, rubs, or gallops. No clubbing, cyanosis, edema.  Radials/PT 2+ and equal bilaterally.  Respiratory:  Respirations regular and unlabored, clear to auscultation bilaterally. GI: Soft, nontender, nondistended. MS: No deformity or atrophy. Skin: Warm and dry, no rash. Neuro:  Strength and sensation  are intact. Psych: Normal affect.  Assessment & Plan    HTN - BP well controlled. Continue current antihypertensive regimen.    CKD III - Careful titration of diuretic and antihypertensive.   Chronic diastolic heart failure - Euvolemic and well compensated on exam. Echo 10/2021 normal LVEF, gr1DD. Low sodium diet, fluid restriction <2L, and daily weights encouraged. Educated to contact our office for weight gain of 2 lbs overnight or 5 lbs in one week.   HLD - Continue Rosuvastatin. Repeat lipids upcoming.   CAD - LAD calcification on CT chest. Myoview 10/2021 low risk study. Continue GDMT rosuvastatin, amlodipine. Mar[facility] Heart healthy diet and regular cardiovascular exercise encouraged.       Disposition: Follow up in 3 month(s) with Skeet Latch, MD or APP.  Signed, Loel Dubonnet, NP 11/15/2021, 11:14 PM [place]

## 2021-11-13 NOTE — Patient Instructions (Signed)
Medication Instructions:  Your Physician recommend you continue on your current medication as directed.    *If you need a refill on your cardiac medications before your next appointment, please call your pharmacy*  Follow-Up: At [facility], you and your health needs are our priority.  As part of our continuing mission to provide you with exceptional heart care, we have created designated Provider Care Teams.  These Care Teams include your primary Cardiologist (physician) and Advanced Practice Providers (APPs -  Physician Assistants and Nurse Practitioners) who all work together to provide you with the care you need, when you need it.  We recommend signing up for the patient portal called "MyChart".  Sign up information is provided on this After Visit Summary.  MyChart is used to connect with patients for Virtual Visits (Telemedicine).  Patients are able to view lab/test results, encounter notes, upcoming appointments, etc.  Non-urgent messages can be sent to your provider as well.   To learn more about what you can do with MyChart, go to [email or web address].    Your next appointment:   3-4 month(s)  The format for your next appointment:   In Person  Provider:   Chilton Si, MD or [name], NP{  Other Instructions Heart Healthy Diet Recommendations: A low-salt diet is recommended. Meats should be grilled, baked, or boiled. Avoid fried foods. Focus on lean protein sources like fish or chicken with vegetables and fruits. The American Heart Association is a Chief Technology Officer!  American Heart Association Diet and Lifeystyle Recommendations   Exercise recommendations: The American Heart Association recommends 150 minutes of moderate intensity exercise weekly. Try 30 minutes of moderate intensity exercise 4-5 times per week. This could include walking, jogging, or swimming.   Important Information About Sugar

## 2021-11-15 ENCOUNTER — Encounter (HOSPITAL_BASED_OUTPATIENT_CLINIC_OR_DEPARTMENT_OTHER): Payer: Self-pay | Admitting: Family

## 2021-12-03 ENCOUNTER — Telehealth: Payer: Self-pay | Admitting: Internal Medicine

## 2021-12-03 NOTE — Telephone Encounter (Signed)
Called patient regarding upcoming July appointments, patient has been called and voicemail was left. 

## 2021-12-26 ENCOUNTER — Inpatient Hospital Stay: Payer: Medicare Other | Attending: Internal Medicine

## 2021-12-26 ENCOUNTER — Other Ambulatory Visit: Payer: Self-pay

## 2021-12-26 ENCOUNTER — Inpatient Hospital Stay (HOSPITAL_BASED_OUTPATIENT_CLINIC_OR_DEPARTMENT_OTHER): Payer: Medicare Other | Admitting: Internal Medicine

## 2021-12-26 VITALS — BP 136/71 | HR 77 | Temp 98.7°F | Resp 16 | Wt 167.1 lb

## 2021-12-26 DIAGNOSIS — D500 Iron deficiency anemia secondary to blood loss (chronic): Secondary | ICD-10-CM

## 2021-12-26 DIAGNOSIS — K319 Disease of stomach and duodenum, unspecified: Secondary | ICD-10-CM | POA: Insufficient documentation

## 2021-12-26 DIAGNOSIS — Z79899 Other long term (current) drug therapy: Secondary | ICD-10-CM | POA: Insufficient documentation

## 2021-12-26 LAB — IRON AND IRON BINDING CAPACITY (CC-WL,HP ONLY)
Iron: 40 ug/dL — ABNORMAL LOW (ref 45–182)
Saturation Ratios: 12 % — ABNORMAL LOW (ref 17.9–39.5)
TIBC: 322 ug/dL (ref 250–450)
UIBC: 282 ug/dL (ref 117–376)

## 2021-12-26 LAB — CBC WITH DIFFERENTIAL (CANCER CENTER ONLY)
Abs Immature Granulocytes: 0.01 10*3/uL (ref 0.00–0.07)
Basophils Absolute: 0 10*3/uL (ref 0.0–0.1)
Basophils Relative: 0 %
Eosinophils Absolute: 0.2 10*3/uL (ref 0.0–0.5)
Eosinophils Relative: 3 %
HCT: 30.5 % — ABNORMAL LOW (ref 39.0–52.0)
Hemoglobin: 10.5 g/dL — ABNORMAL LOW (ref 13.0–17.0)
Immature Granulocytes: 0 %
Lymphocytes Relative: 29 %
Lymphs Abs: 2.3 10*3/uL (ref 0.7–4.0)
MCH: 28.7 pg (ref 26.0–34.0)
MCHC: 34.4 g/dL (ref 30.0–36.0)
MCV: 83.3 fL (ref 80.0–100.0)
Monocytes Absolute: 0.5 10*3/uL (ref 0.1–1.0)
Monocytes Relative: 6 %
Neutro Abs: 4.8 10*3/uL (ref 1.7–7.7)
Neutrophils Relative %: 62 %
Platelet Count: 165 10*3/uL (ref 150–400)
RBC: 3.66 MIL/uL — ABNORMAL LOW (ref 4.22–5.81)
RDW: 15.8 % — ABNORMAL HIGH (ref 11.5–15.5)
WBC Count: 7.7 10*3/uL (ref 4.0–10.5)
nRBC: 0 % (ref 0.0–0.2)

## 2021-12-26 LAB — FERRITIN: Ferritin: 28 ng/mL (ref 24–336)

## 2021-12-26 NOTE — Progress Notes (Signed)
[facility] [phone]   [phone]  OFFICE PROGRESS NOTE  Stephen Olszewski, MD [address] 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  PRIOR THERAPY: Iron infusion with Ferrlecit weekly for 4 weeks started April 15, 2019  CURRENT THERAPY: Ferrous sulfate 325 mg p.o. daily.  INTERVAL HISTORY: Stephen Chavez 88 y.o. male returns to the clinic today for follow-up visit accompanied by his wife.  The patient is feeling fine today with no concerning complaints except for mild fatigue and occasional shortness of breath with exertion.  The patient denied having any nausea, vomiting, diarrhea or constipation.  He denied having any recent weight loss or night sweats.  He has no chest pain or hemoptysis.  He has been tolerating his treatment with ferrous sulfate with orange juice fairly well.  He is here today for evaluation and repeat blood work.   MEDICAL HISTORY: Past Medical History:  Diagnosis Date   Amaurosis fugax of right eye 09/03/2016   CAD in native artery 07/05/2021   Carotid stenosis 09/03/2016   Mild 05/2016   Chronic combined systolic and diastolic heart failure (HCC) 10/23/2016   Essential hypertension 09/03/2016   Hyperlipidemia    Hypertension     ALLERGIES:  is allergic to allopurinol and colchicine.  MEDICATIONS:  Current Outpatient Medications  Medication Sig Dispense Refill   acetaminophen (TYLENOL) 160 MG/5ML suspension Take 240 mg by mouth every 6 (six) hours as needed for mild pain or headache.     amLODipine (NORVASC) 5 MG tablet Take 5 mg by mouth at bedtime.     cetirizine (ZYRTEC) 10 MG tablet Take 10 mg by mouth daily as needed for allergies or rhinitis.     febuxostat (ULORIC) 40 MG tablet Take 40 mg by mouth daily as needed (for gout flares).     ferrous sulfate 325 (65 FE) MG tablet Take 1 tablet (325 mg  total) by mouth daily with breakfast.  3   METAMUCIL FIBER PO Take 12 g by mouth See admin instructions. Mix 12 grams of powder into 6-8 ounces of apple juice and drink once a day     mirtazapine (REMERON) 7.5 MG tablet Take 3.75-7.5 mg by mouth See admin instructions. Starting on 07/28/2021, take 3.75 mg by mouth at bedtime and increase to 7.5 mg after 1 week     MUCINEX FAST-MAX CONGEST COUGH 2.5-5-100 MG/5ML LIQD Take 5 mLs by mouth every 12 (twelve) hours as needed (for coughing).     Multiple Vitamins-Minerals (CENTRUM SILVER) CHEW Chew 1-2 tablets by mouth daily with breakfast.     pantoprazole (PROTONIX) 40 MG tablet Take 1 tablet (40 mg total) by mouth daily. (Patient taking differently: Take 40 mg by mouth daily before breakfast.) 30 tablet 1   rosuvastatin (CRESTOR) 40 MG tablet Take 1 tablet (40 mg total) by mouth daily. (Patient taking differently: Take 40 mg by mouth at bedtime.) 90 tablet 3   senna (SENOKOT) 8.6 MG TABS tablet Take 1 tablet by mouth daily.     No current facility-administered medications for this visit.    SURGICAL HISTORY:  Past Surgical History:  Procedure Laterality Date   BIOPSY  04/01/2019   Procedure: BIOPSY;  Surgeon: Kathi Der, MD;  Location: [facility];  Service: Gastroenterology;;   COLONOSCOPY WITH PROPOFOL N/A 04/01/2019   Procedure: COLONOSCOPY WITH PROPOFOL;  Surgeon: Kathi Der, MD;  Location: [facility];  Service: Gastroenterology;  Laterality: N/A;   ENTEROSCOPY N/A 04/01/2019   Procedure: Push ENTEROSCOPY;  Surgeon: Kathi Der, MD;  Location: [facility];  Service: Gastroenterology;  Laterality: N/A;   EYE SURGERY     HOT HEMOSTASIS N/A 04/01/2019   Procedure: HOT HEMOSTASIS (ARGON PLASMA COAGULATION/BICAP);  Surgeon: Kathi Der, MD;  Location: [facility];  Service: Gastroenterology;  Laterality: N/A;   polyp removal     POLYPECTOMY  04/01/2019   Procedure: POLYPECTOMY;  Surgeon: Kathi Der, MD;   Location: [facility];  Service: Gastroenterology;;    REVIEW OF SYSTEMS:  A comprehensive review of systems was negative except for: Constitutional: positive for fatigue Respiratory: positive for dyspnea on exertion   PHYSICAL EXAMINATION: General appearance: alert, cooperative, and no distress Head: Normocephalic, without obvious abnormality, atraumatic Neck: no adenopathy, no JVD, supple, symmetrical, trachea midline, and thyroid not enlarged, symmetric, no tenderness/mass/nodules Lymph nodes: Cervical, supraclavicular, and axillary nodes normal. Resp: clear to auscultation bilaterally Back: symmetric, no curvature. ROM normal. No CVA tenderness. Cardio: regular rate and rhythm, S1, S2 normal, no murmur, click, rub or gallop GI: soft, non-tender; bowel sounds normal; no masses,  no organomegaly Extremities: extremities normal, atraumatic, no cyanosis or edema  ECOG PERFORMANCE STATUS: 1 - Symptomatic but completely ambulatory  Blood pressure 136/71, pulse 77, temperature 98.7 F (37.1 C), temperature source Oral, resp. rate 16, weight 167 lb 1 oz (75.8 kg), SpO2 97 %.  LABORATORY DATA: Lab Results  Component Value Date   WBC 7.7 12/26/2021   HGB 10.5 (L) 12/26/2021   HCT 30.5 (L) 12/26/2021   MCV 83.3 12/26/2021   PLT 165 12/26/2021      Chemistry      Component Value Date/Time   NA 138 08/01/2021 0220   K 4.6 08/01/2021 0220   CL 104 08/01/2021 0220   CO2 23 08/01/2021 0220   BUN 18 08/01/2021 0220   CREATININE 2.05 (H) 08/01/2021 0220   CREATININE 1.94 (H) 12/22/2019 1122      Component Value Date/Time   CALCIUM 9.5 08/01/2021 0220   ALKPHOS 59 08/01/2021 0220   AST 17 08/01/2021 0220   AST 13 (L) 12/22/2019 1122   ALT 11 08/01/2021 0220   ALT 17 12/22/2019 1122   BILITOT 0.6 08/01/2021 0220   BILITOT 0.4 12/22/2019 1122       RADIOGRAPHIC STUDIES: No results found.  ASSESSMENT AND PLAN: This is a very pleasant 88 years old African-American male with  history of iron deficiency anemia secondary to gastrointestinal blood loss from AV malformation. The patient was treated with intravenous iron infusion with Ferrlecit weekly for 4 weeks.  The patient has been on oral iron tablet with over-the-counter ferrous sulfate with orange juice and he has been tolerating it fairly well. Repeat CBC today showed persistent anemia with hemoglobin of 10.5 and hematocrit 30.5%.  Iron study showed low serum iron of 40 with iron saturation of 12%. Ferritin level is still pending. I will arrange for the patient to receive iron infusion again with Venofer for 3 doses starting next week. I will see the patient back for follow-up visit in 6 months for evaluation and repeat blood work. He will continue with the oral iron tablet in addition to the iron infusion. He was advised to call immediately if he has any concerning symptoms in the interval.  The patient voices understanding of current disease status and treatment options and is in agreement with the current care plan. All questions were answered. The patient knows to  call the clinic with any problems, questions or concerns. We can certainly see the patient much sooner if necessary.  Disclaimer: This note was dictated with voice recognition software. Similar sounding words can inadvertently be transcribed and may not be corrected upon review.

## 2021-12-30 ENCOUNTER — Telehealth: Payer: Self-pay | Admitting: Pharmacy Technician

## 2021-12-30 NOTE — Telephone Encounter (Signed)
Auth Submission: no auth needed Payer: [employer] MEDICARE Medication & CPT/J Code(s) submitted: Venofer (Iron Sucrose) J1756 Route of submission (phone, fax, portal): PORTAL Auth type: Buy/Bill Units/visits requested: X3 DOSES Reference number:  Approval from: 12/30/21 to 04/01/22

## 2022-01-02 ENCOUNTER — Ambulatory Visit (INDEPENDENT_AMBULATORY_CARE_PROVIDER_SITE_OTHER): Payer: Medicare Other

## 2022-01-02 VITALS — BP 146/69 | HR 65 | Temp 97.8°F | Resp 16 | Ht 72.0 in | Wt 169.5 lb

## 2022-01-02 DIAGNOSIS — D500 Iron deficiency anemia secondary to blood loss (chronic): Secondary | ICD-10-CM | POA: Diagnosis not present

## 2022-01-02 MED ORDER — SODIUM CHLORIDE 0.9 % IV SOLN
300.0000 mg | INTRAVENOUS | Status: DC
  Administered 2022-01-02: 300 mg via INTRAVENOUS
  Filled 2022-01-02: qty 15

## 2022-01-02 NOTE — Progress Notes (Signed)
Diagnosis: Iron Deficiency Anemia  Provider:  Chilton Greathouse, MD  Procedure: Infusion  IV Type: Peripheral, IV Location: R Forearm  Venofer (Iron Sucrose), Dose: 300 mg  Infusion Start Time: 1341  Infusion Stop Time: 1530  Post Infusion IV Care: Peripheral IV Discontinued  Discharge: Condition: Good, Destination: Home . AVS provided to patient.   Performed by:  Nat Math, RN

## 2022-01-09 ENCOUNTER — Ambulatory Visit (INDEPENDENT_AMBULATORY_CARE_PROVIDER_SITE_OTHER): Payer: Medicare Other

## 2022-01-09 VITALS — BP 137/62 | HR 63 | Temp 97.7°F | Resp 18 | Ht 72.0 in | Wt 169.2 lb

## 2022-01-09 DIAGNOSIS — D500 Iron deficiency anemia secondary to blood loss (chronic): Secondary | ICD-10-CM

## 2022-01-09 MED ORDER — SODIUM CHLORIDE 0.9 % IV SOLN
300.0000 mg | INTRAVENOUS | Status: DC
  Administered 2022-01-09: 300 mg via INTRAVENOUS
  Filled 2022-01-09: qty 15

## 2022-01-09 NOTE — Progress Notes (Signed)
Diagnosis: Iron Deficiency Anemia  Provider:  Chilton Greathouse, MD  Procedure: Infusion  IV Type: Peripheral, IV Location: R Antecubital  Venofer (Iron Sucrose), Dose: 300 mg  Infusion Start Time: 1343  Infusion Stop Time: 1552  Post Infusion IV Care: Peripheral IV Discontinued  Discharge: Condition: Good, Destination: Home . AVS provided to patient.   Performed by:  Nat Math, RN

## 2022-01-16 ENCOUNTER — Ambulatory Visit (INDEPENDENT_AMBULATORY_CARE_PROVIDER_SITE_OTHER): Payer: Medicare Other

## 2022-01-16 VITALS — BP 143/69 | HR 64 | Temp 97.9°F | Resp 20 | Ht 72.0 in | Wt 170.0 lb

## 2022-01-16 DIAGNOSIS — D500 Iron deficiency anemia secondary to blood loss (chronic): Secondary | ICD-10-CM

## 2022-01-16 MED ORDER — SODIUM CHLORIDE 0.9 % IV SOLN
300.0000 mg | INTRAVENOUS | Status: DC
  Administered 2022-01-16: 300 mg via INTRAVENOUS
  Filled 2022-01-16: qty 15

## 2022-01-16 NOTE — Progress Notes (Signed)
Diagnosis: Iron Deficiency Anemia  Provider:  Chilton Greathouse, MD  Procedure: Infusion  IV Type: Peripheral, IV Location: R Forearm  Venofer (Iron Sucrose), Dose: 300 mg  Infusion Start Time: 1340  Infusion Stop Time: 1519  Post Infusion IV Care: Peripheral IV Discontinued  Discharge: Condition: Good, Destination: Home . AVS provided to patient.   Performed by:  Loney Hering, LPN

## 2022-02-18 ENCOUNTER — Ambulatory Visit (INDEPENDENT_AMBULATORY_CARE_PROVIDER_SITE_OTHER): Payer: Medicare Other | Admitting: Cardiovascular Disease

## 2022-02-18 ENCOUNTER — Encounter (HOSPITAL_BASED_OUTPATIENT_CLINIC_OR_DEPARTMENT_OTHER): Payer: Self-pay | Admitting: Cardiovascular Disease

## 2022-02-18 VITALS — BP 130/80 | HR 76 | Ht 72.0 in | Wt 173.0 lb

## 2022-02-18 DIAGNOSIS — N183 Chronic kidney disease, stage 3 unspecified: Secondary | ICD-10-CM

## 2022-02-18 DIAGNOSIS — I6523 Occlusion and stenosis of bilateral carotid arteries: Secondary | ICD-10-CM

## 2022-02-18 DIAGNOSIS — R413 Other amnesia: Secondary | ICD-10-CM

## 2022-02-18 DIAGNOSIS — I2510 Atherosclerotic heart disease of native coronary artery without angina pectoris: Secondary | ICD-10-CM

## 2022-02-18 DIAGNOSIS — I10 Essential (primary) hypertension: Secondary | ICD-10-CM

## 2022-02-18 DIAGNOSIS — I5032 Chronic diastolic (congestive) heart failure: Secondary | ICD-10-CM

## 2022-02-18 DIAGNOSIS — E782 Mixed hyperlipidemia: Secondary | ICD-10-CM

## 2022-02-18 HISTORY — DX: Other amnesia: R41.3

## 2022-02-18 NOTE — Assessment & Plan Note (Signed)
Stephen Chavez and his wife note that he had some memory loss lately.  He sometimes gets confused when driving or forgets what he was going for when he walks in the room.  Refer to neurology for further assessment.

## 2022-02-18 NOTE — Progress Notes (Signed)
Cardiology Office Note   Date:  02/18/2022   ID:  Stephen Chavez, DOB August 31, 1932, MRN [id number]  PCP:  Stephen Sidle, MD  Cardiologist:   Stephen Latch, MD   No chief complaint on file.   History of Present Illness: Stephen Chavez is a 88 y.o. 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  Stephen Chavez had transient monocular visual loss in his right eye 05/2016. The episode lasted several minutes. Since then it is have been 3 or 4 times in the same eye. He has been seen by an ophthalmologist and no pathology was noted.  His symptoms were felt to be due to amarourosis fugax and TIA evaluation was recommended.  He had carotid Dopplers that revealed mild carotid stenosis bilaterally.  Aspirin was increased from 81 mg 325 mg daily. He followed up with his PCP, Dr. Hulan Chavez, and was referred to cardiology for further evaluation.  He was seen in 2017 and had an echo with bubble that revealed LVEF 45 to 50% with diffuse hypokinesis and grade 1 diastolic dysfunction.  There is moderate tricuspid regurgitation.  It was negative for atrial shunting.  He wore a 30-day monitor that did not reveal any significant arrhythmias.  His blood pressure was poorly controlled so amlodipine was increased.  He has not been seen by cardiology since that time.  He had an echo 06/2018 which revealed LVEF 60 to 65% with grade 1 diastolic dysfunction.  PASP was 40 mmHg.  Stephen Chavez has iron deficiency anemia in the setting of GI AVMs.  He gets iron infusions with Dr. Inda Merlin.  He had bilateral PE 06/2018.  He was noted to have calcification of the LAD on chest CT at that time.  At his last appointment we recommended working on increasing his exercise.  He was referred for a Lexiscan Myoview 07/2021 that revealed LVEF 59% and no ischemia. There was diaphragmatic attenuation.  He was  admitted 07/2021 with generalized weakness thought to be due to intravascular volume depletion.  He was found to be very depressed after his son's death.  VQ scan was negative for pulmonary embolism.  He was given IV iron for iron deficiency anemia.  He followed with Stephen Montana, NP on 10/2021 and reported swelling in his right hand but is otherwise well.  [name], reports a persistent runny nose while eating for the past two to three months. He takes Zyrtec daily for allergies. He denies any abdominal pain. The patient experiences general fatigue and stays in bed until late morning, but denies any chest pain or shortness of breath. He had a normal thyroid function in February and received three iron infusions without significant improvement in energy levels.  Stephen Chavez is on medication for sleep, appetite, and mood, including mirtazapine. He reports possible memory loss and occasional disorientation while driving, especially at night. The patient has noticed changes in his behavior and a decrease in communication. A referral to a neurologist has been suggested to evaluate for dementia or other neurological issues.  The patient denies any chest pain, pressure, or breathing difficulties. He reports no swelling in his legs or feet and no pain while walking. Stephen Chavez has a history of gout in his ankle and has been on prednisone for treatment. He is considering a more preventative approach, such as Uloric, due to the recurrence of gout symptoms.  The patient is also scheduled to see a nephrologist for kidney-related concerns.  Past Medical History:  Diagnosis Date   Amaurosis fugax of right eye 09/03/2016   CAD in native artery 07/05/2021   Carotid stenosis 09/03/2016   Mild 05/2016   Chronic combined systolic and diastolic heart failure (HCC) 10/23/2016   Essential hypertension 09/03/2016   Hyperlipidemia    Hypertension    Memory loss 02/18/2022    Past Surgical History:  Procedure Laterality Date    BIOPSY  04/01/2019   Procedure: BIOPSY;  Surgeon: Kathi Der, MD;  [facility]: [facility];  Service: Gastroenterology;;   COLONOSCOPY WITH PROPOFOL N/A 04/01/2019   Procedure: COLONOSCOPY WITH PROPOFOL;  Surgeon: Kathi Der, MD;  Location: [facility];  Service: Gastroenterology;  Laterality: N/A;   ENTEROSCOPY N/A 04/01/2019   Procedure: Push ENTEROSCOPY;  Surgeon: Kathi Der, MD;  Location: [facility];  Service: Gastroenterology;  Laterality: N/A;   EYE SURGERY     HOT HEMOSTASIS N/A 04/01/2019   Procedure: HOT HEMOSTASIS (ARGON PLASMA COAGULATION/BICAP);  Surgeon: Kathi Der, MD;  Location: [facility] ENDOSCOPY;  Service: Gastroenterology;  Laterality: N/A;   polyp removal     POLYPECTOMY  04/01/2019   Procedure: POLYPECTOMY;  Surgeon: Kathi Der, MD;  Location: [facility];  Service: Gastroenterology;;     Current Outpatient Medications  Medication Sig Dispense Refill   acetaminophen (TYLENOL) 160 MG/5ML suspension Take 240 mg by mouth every 6 (six) hours as needed for mild pain or headache.     amLODipine (NORVASC) 5 MG tablet Take 5 mg by mouth at bedtime.     cetirizine (ZYRTEC) 10 MG tablet Take 10 mg by mouth daily as needed for allergies or rhinitis.     febuxostat (ULORIC) 40 MG tablet Take 40 mg by mouth daily as needed (for gout flares).     ferrous sulfate 325 (65 FE) MG tablet Take 1 tablet (325 mg total) by mouth daily with breakfast.  3   METAMUCIL FIBER PO Take 12 g by mouth See admin instructions. Mix 12 grams of powder into 6-8 ounces of apple juice and drink once a day     mirtazapine (REMERON) 7.5 MG tablet Take 3.75-7.5 mg by mouth See admin instructions. Starting on 07/28/2021, take 3.75 mg by mouth at bedtime and increase to 7.5 mg after 1 week     MUCINEX FAST-MAX CONGEST COUGH 2.5-5-100 MG/5ML LIQD Take 5 mLs by mouth every 12 (twelve) hours as needed (for coughing).     Multiple Vitamins-Minerals (CENTRUM SILVER) CHEW Chew 1-2  tablets by mouth daily with breakfast.     senna (SENOKOT) 8.6 MG TABS tablet Take 1 tablet by mouth daily.     pantoprazole (PROTONIX) 40 MG tablet Take 1 tablet (40 mg total) by mouth daily. (Patient taking differently: Take 40 mg by mouth daily before breakfast.) 30 tablet 1   rosuvastatin (CRESTOR) 40 MG tablet Take 1 tablet (40 mg total) by mouth daily. (Patient taking differently: Take 40 mg by mouth at bedtime.) 90 tablet 3   No current facility-administered medications for this visit.    Allergies:   Allopurinol and Colchicine    Social History:  The patient  reports that he has never smoked. He has never used smokeless tobacco. He reports current alcohol use of about 4.0 standard drinks of alcohol per week. He reports that he does not use drugs.   Family History:  The patient's family history includes Hypertension in his mother.    ROS:  Please see  the history of present illness.   Otherwise, review of systems are positive for none.   All other systems are reviewed and negative.    PHYSICAL EXAM: VS:  BP 130/80   Pulse 76   Ht 6' (1.829 m)   Wt 173 lb (78.5 kg)   BMI 23.46 kg/m  , BMI Body mass index is 23.46 kg/m. GENERAL:  Well appearing HEENT:  Pupils equal round and reactive, fundi not visualized, oral mucosa unremarkable NECK:  No jugular venous distention, waveform within normal limits, carotid upstroke brisk and symmetric, no bruits, no thyromegaly LUNGS:  Clear to auscultation bilaterally HEART:  RRR.  PMI not displaced or sustained,S1 and S2 within normal limits, no S3, no S4, no clicks, no rubs, no murmurs ABD:  Flat, positive bowel sounds normal in frequency in pitch, no bruits, no rebound, no guarding, no midline pulsatile mass, no hepatomegaly, no splenomegaly EXT:  2 plus pulses throughout, no edema, no cyanosis no clubbing SKIN:  No rashes no nodules NEURO:  Cranial nerves II through XII grossly intact, motor grossly intact throughout PSYCH:  Cognitively  intact, oriented to person place and time   EKG:  EKG is ordered today. The ekg ordered today demonstrates sinus rhythm.  Rate 61 bpm.  LAFB.  LVH.  30 Day Event Monitor 09/2016:   Quality: Fair.  Baseline artifact. Predominant rhythm: sinus rhythm Average heart rate: 73 bpm Minimum heart rate 59 bpm Maximum heart rate 99 bpm   No arrhythmias noted  Echo 09/23/16: Study Conclusions   - Left ventricle: The cavity size was normal. Wall thickness was    increased in a pattern of moderate LVH. There was severe focal    basal hypertrophy of the septum. Systolic function was mildly    reduced. The estimated ejection fraction was in the range of 45%    to 50%. Diffuse hypokinesis. Doppler parameters are consistent    with abnormal left ventricular relaxation (grade 1 diastolic    dysfunction). The E/e&' ratio is between 8-15, suggesting    indeterminate LV filling pressure.  - Aortic valve: Trileaflet. Sclerosis without stenosis. There was    trivial regurgitation.  - Mitral valve: Mildly thickened leaflets . There was trivial    regurgitation.  - Tricuspid valve: There was moderate regurgitation.  - Pulmonary arteries: PA peak pressure: 28 mm Hg (S).  - Inferior vena cava: The vessel was normal in size. The    respirophasic diameter changes were in the normal range (= 50%),    consistent with normal central venous pressure.   Echo 06/28/18: Study Conclusions   - Left ventricle: The cavity size was normal. Wall thickness was    increased in a pattern of mild LVH. Systolic function was normal.    The estimated ejection fraction was in the range of 60% to 65%.    Wall motion was normal; there were no regional wall motion    abnormalities. Doppler parameters are consistent with abnormal    left ventricular relaxation (grade 1 diastolic dysfunction). The    E;e&' ratio is between 8-15, suggesting indeterminate LV filling    pressure.  - Aortic valve: Trileaflet. Sclerosis without  stenosis. There was    trivial regurgitation.  - Left atrium: The atrium was normal in size.  - Right ventricle: The cavity size was normal. Wall thickness was    normal. Systolic function was normal.  - Right atrium: The atrium was normal in size.  - Tricuspid valve: There was mild regurgitation.  -  Pulmonary arteries: PA peak pressure: 40 mm Hg (S) + RAP.  - Pericardium, extracardiac: Not well visualized.   Impressions:   - Compared to a prior study in 2018, the LVEF is higher at 60-65%.   Lexiscan Myoview 2/203:   Findings are consistent with no prior ischemia. The study is low risk.   No ST deviation was noted.   Left ventricular function is normal. Nuclear stress EF: 59 %. The left ventricular ejection fraction is normal (55-65%). End diastolic cavity size is normal. End systolic cavity size is normal.   Prior study available for comparison from 10/16/2016. No changes compared to prior study.   Findings: No evidence of ischemia. There is prominent subdiaphragmatic attenuation.   There is an inferior perfusion defect in rest that improves with stress consistent with attenuation artifact.   Conclusions: Stress test is negative for ischemia Low risk study. No changes from 2018.  Recent Labs: 07/25/2021: TSH 1.053 07/30/2021: Magnesium 2.2 08/01/2021: ALT 11; BUN 18; Creatinine, Ser 2.05; Potassium 4.6; Sodium 138 12/26/2021: Hemoglobin 10.5; Platelet Count 165    Lipid Panel No results found for: "CHOL", "TRIG", "HDL", "CHOLHDL", "VLDL", "LDLCALC", "LDLDIRECT"    Wt Readings from Last 3 Encounters:  02/18/22 173 lb (78.5 kg)  01/16/22 170 lb (77.1 kg)  01/09/22 169 lb 3.2 oz (76.7 kg)      ASSESSMENT AND PLAN:  Carotid stenosis Minimal carotid stenosis.    Essential hypertension Blood pressure is well-controlled on amlodipine.  Encouraged him to increase his exercise.  Memory loss Mr. Hazzard and his wife note that he had some memory loss lately.  He sometimes gets  confused when driving or forgets what he was going for when he walks in the room.  Refer to neurology for further assessment.  CAD in native artery Nonobstructive CAD.  He had a stress test last year that was negative for ischemia.  Encouraged him to start walking at least 10 minutes daily.  LDL goal is less than 70.  He was switched from pravastatin to rosuvastatin.  He sees his PCP later this month and we will ask the get fasting lipids at that time.  His LDL goal is less than 70.  Mixed hyperlipidemia LDL was 89 when checked 07/2021.  Repeat fasting lipids and a CMP as above.  Continue rosuvastatin for now.   Current medicines are reviewed at length with the patient today.  The patient does not have concerns regarding medicines.  The following changes have been made: switch pravastatin to rosuvastatin  Labs/ tests ordered today include:   No orders of the defined types were placed in this encounter.    Disposition:   FU with Lucciano Vitali C. Duke Salvia, MD, [facility] in 3 months.      Signed, Salvator Seppala C. Duke Salvia, MD, [facility]  02/18/2022 2:39 PM    [facility]

## 2022-02-18 NOTE — Patient Instructions (Addendum)
Medication Instructions:  Your physician recommends that you continue on your current medications as directed. Please refer to the Current Medication list given to you today.   *If you need a refill on your cardiac medications before your next appointment, please call your pharmacy*  Lab Work: HAVE YOUR PRIMARY CARE GET FASTING LIPIDS WHEN YOU SEE THEM   Testing/Procedures: NONE  Follow-Up: At [facility], you and your health needs are our priority.  As part of our continuing mission to provide you with exceptional heart care, we have created designated Provider Care Teams.  These Care Teams include your primary Cardiologist (physician) and Advanced Practice Providers (APPs -  Physician Assistants and Nurse Practitioners) who all work together to provide you with the care you need, when you need it.  We recommend signing up for the patient portal called "MyChart".  Sign up information is provided on this After Visit Summary.  MyChart is used to connect with patients for Virtual Visits (Telemedicine).  Patients are able to view lab/test results, encounter notes, upcoming appointments, etc.  Non-urgent messages can be sent to your provider as well.   To learn more about what you can do with MyChart, go to [email or web address].    Your next appointment:   6 month(s)  The format for your next appointment:   In Person  Provider:   Chilton Si, MD    You have been referred to NEUROLOGY  [facility] [place] [address] [place], [address] [place] [address]QMVHQ: [phone] IF YOU DO NOT HEAR FROM THE OFFICE IN 2 WEEKS YOU CAN CALL THEM DIRECTLY AT THE NUMBER ABOVE  YOU NEED TO WALK 10 MINUTES EVERY DAY

## 2022-02-18 NOTE — Assessment & Plan Note (Signed)
LDL was 89 when checked 07/2021.  Repeat fasting lipids and a CMP as above.  Continue rosuvastatin for now.

## 2022-02-18 NOTE — Assessment & Plan Note (Signed)
Blood pressure is well-controlled on amlodipine.  Encouraged him to increase his exercise.

## 2022-02-18 NOTE — Assessment & Plan Note (Signed)
Minimal carotid stenosis.

## 2022-02-18 NOTE — Assessment & Plan Note (Signed)
Nonobstructive CAD.  He had a stress test last year that was negative for ischemia.  Encouraged him to start walking at least 10 minutes daily.  LDL goal is less than 70.  He was switched from pravastatin to rosuvastatin.  He sees his PCP later this month and we will ask the get fasting lipids at that time.  His LDL goal is less than 70.

## 2022-02-24 ENCOUNTER — Other Ambulatory Visit (INDEPENDENT_AMBULATORY_CARE_PROVIDER_SITE_OTHER): Payer: Medicare Other

## 2022-02-24 ENCOUNTER — Ambulatory Visit: Payer: Medicare Other | Admitting: Physician Assistant

## 2022-02-24 ENCOUNTER — Encounter: Payer: Self-pay | Admitting: Physician Assistant

## 2022-02-24 VITALS — BP 124/73 | HR 81 | Resp 18 | Ht 72.0 in | Wt 169.0 lb

## 2022-02-24 DIAGNOSIS — R413 Other amnesia: Secondary | ICD-10-CM

## 2022-02-24 DIAGNOSIS — G3184 Mild cognitive impairment, so stated: Secondary | ICD-10-CM | POA: Diagnosis not present

## 2022-02-24 LAB — VITAMIN B12: Vitamin B-12: 439 pg/mL (ref 211–911)

## 2022-02-24 LAB — TSH: TSH: 1.57 u[IU]/mL (ref 0.35–5.50)

## 2022-02-24 MED ORDER — MEMANTINE HCL 5 MG PO TABS
5.0000 mg | ORAL_TABLET | Freq: Every evening | ORAL | 11 refills | Status: DC
Start: 2022-02-24 — End: 2022-11-26

## 2022-02-24 NOTE — Patient Instructions (Addendum)
It was a pleasure to see you today at our office.   Recommendations:  MRI of the brain, the radiology office will call you to arrange you appointment Check labs today Start memantine 5 mg at night  Follow up in 1 month   Whom to call:  Memory  decline, memory medications: Call our office [phone]   For psychiatric meds, mood meds: Please have your primary care physician manage these medications.   Counseling regarding caregiver distress, including caregiver depression, anxiety and issues regarding community resources, adult day care programs, adult living facilities, or memory care questions:   Feel free to contact Misty Lisabeth Register, Social Worker at [phone]   For assessment of decision of mental capacity and competency:  Call Dr. Erick Blinks, geriatric psychiatrist at [phone]  For guidance in geriatric dementia issues please call [facility] [phone]  For guidance regarding [facility] and if placement were needed at the facility, contact Sidney Ace, Social Worker tel: [phone]  If you have any severe symptoms of a stroke, or other severe issues such as confusion,severe chills or fever, etc call 911 or go to the ER as you may need to be evaluated further   Feel free to visit Facebook page " [employer]" for tips of how to care for people with memory problems.   Feel free to go to the following database for funded clinical studies conducted around the world: RankChecks.se   https://www.triadclinicaltrials.com/     RECOMMENDATIONS FOR ALL PATIENTS WITH MEMORY PROBLEMS: 1. Continue to exercise (Recommend 30 minutes of walking everyday, or 3 hours every week) 2. Increase social interactions - continue going to [place] and enjoy social gatherings with friends and family 3. Eat healthy, avoid fried foods and eat more fruits and vegetables 4. Maintain adequate blood pressure, blood sugar, and blood cholesterol  level. Reducing the risk of stroke and cardiovascular disease also helps promoting better memory. 5. Avoid stressful situations. Live a simple life and avoid aggravations. Organize your time and prepare for the next day in anticipation. 6. Sleep well, avoid any interruptions of sleep and avoid any distractions in the bedroom that may interfere with adequate sleep quality 7. Avoid sugar, avoid sweets as there is a strong link between excessive sugar intake, diabetes, and cognitive impairment We discussed the Mediterranean diet, which has been shown to help patients reduce the risk of progressive memory disorders and reduces cardiovascular risk. This includes eating fish, eat fruits and green leafy vegetables, nuts like almonds and hazelnuts, walnuts, and also use olive oil. Avoid fast foods and fried foods as much as possible. Avoid sweets and sugar as sugar use has been linked to worsening of memory function.  There is always a concern of gradual progression of memory problems. If this is the case, then we may need to adjust level of care according to patient needs. Support, both to the patient and caregiver, should then be put into place.     FALL PRECAUTIONS: Be cautious when walking. Scan the area for obstacles that may increase the risk of trips and falls. When getting up in the mornings, sit up at the edge of the bed for a few minutes before getting out of bed. Consider elevating the bed at the head end to avoid drop of blood pressure when getting up. Walk always in a well-lit room (use night lights in the walls). Avoid area rugs or power cords from appliances in the middle of the walkways. Use a walker or a  cane if necessary and consider physical therapy for balance exercise. Get your eyesight checked regularly.  FINANCIAL OVERSIGHT: Supervision, especially oversight when making financial decisions or transactions is also recommended.  HOME SAFETY: Consider the safety of the kitchen when  operating appliances like stoves, microwave oven, and blender. Consider having supervision and share cooking responsibilities until no longer able to participate in those. Accidents with firearms and other hazards in the house should be identified and addressed as well.   ABILITY TO BE LEFT ALONE: If patient is unable to contact 911 operator, consider using LifeLine, or when the need is there, arrange for someone to stay with patients. Smoking is a fire hazard, consider supervision or cessation. Risk of wandering should be assessed by caregiver and if detected at any point, supervision and safe proof recommendations should be instituted.  MEDICATION SUPERVISION: Inability to self-administer medication needs to be constantly addressed. Implement a mechanism to ensure safe administration of the medications.   DRIVING: Regarding driving, in patients with progressive memory problems, driving will be impaired. We advise to have someone else do the driving if trouble finding directions or if minor accidents are reported. Independent driving assessment is available to determine safety of driving.   If you are interested in the driving assessment, you can contact the following:  [facility] [employer] in [place] [phone]  [facility] [phone]  [facility] [phone] or [phone]    Mediterranean Diet A Mediterranean diet refers to food and lifestyle choices that are based on the traditions of countries located on the [employer] [place]. This way of eating has been shown to help prevent certain conditions and improve outcomes for people who have chronic diseases, like kidney disease and heart disease. What are tips for following this plan? Lifestyle  Cook and eat meals together with your family, when possible. Drink enough fluid to keep your urine clear or pale yellow. Be physically active every day. This  includes: Aerobic exercise like running or swimming. Leisure activities like gardening, walking, or housework. Get 7-8 hours of sleep each night. If recommended by your health care provider, drink red wine in moderation. This means 1 glass a day for nonpregnant women and 2 glasses a day for men. A glass of wine equals 5 oz (150 mL). Reading food labels  Check the serving size of packaged foods. For foods such as rice and pasta, the serving size refers to the amount of cooked product, not dry. Check the total fat in packaged foods. Avoid foods that have saturated fat or trans fats. Check the ingredients list for added sugars, such as corn syrup. Shopping  At the grocery store, buy most of your food from the areas near the walls of the store. This includes: Fresh fruits and vegetables (produce). Grains, beans, nuts, and seeds. Some of these may be available in unpackaged forms or large amounts (in bulk). Fresh seafood. Poultry and eggs. Low-fat dairy products. Buy whole ingredients instead of prepackaged foods. Buy fresh fruits and vegetables in-season from local farmers markets. Buy frozen fruits and vegetables in resealable bags. If you do not have access to quality fresh seafood, buy precooked frozen shrimp or canned fish, such as tuna, salmon, or sardines. Buy small amounts of raw or cooked vegetables, salads, or olives from the deli or salad bar at your store. Stock your pantry so you always have certain foods on hand, such as olive oil, canned tuna, canned tomatoes, rice, pasta, and beans. Cooking  Cook foods with extra-virgin olive oil instead of using butter or other vegetable oils. Have meat as a side dish, and have vegetables or grains as your main dish. This means having meat in small portions or adding small amounts of meat to foods like pasta or stew. Use beans or vegetables instead of meat in common dishes like chili or lasagna. Experiment with different cooking methods. Try  roasting or broiling vegetables instead of steaming or sauteing them. Add frozen vegetables to soups, stews, pasta, or rice. Add nuts or seeds for added healthy fat at each meal. You can add these to yogurt, salads, or vegetable dishes. Marinate fish or vegetables using olive oil, lemon juice, garlic, and fresh herbs. Meal planning  Plan to eat 1 vegetarian meal one day each week. Try to work up to 2 vegetarian meals, if possible. Eat seafood 2 or more times a week. Have healthy snacks readily available, such as: Vegetable sticks with hummus. Greek yogurt. Fruit and nut trail mix. Eat balanced meals throughout the week. This includes: Fruit: 2-3 servings a day Vegetables: 4-5 servings a day Low-fat dairy: 2 servings a day Fish, poultry, or lean meat: 1 serving a day Beans and legumes: 2 or more servings a week Nuts and seeds: 1-2 servings a day Whole grains: 6-8 servings a day Extra-virgin olive oil: 3-4 servings a day Limit red meat and sweets to only a few servings a month What are my food choices? Mediterranean diet Recommended Grains: Whole-grain pasta. Brown rice. Bulgar wheat. Polenta. Couscous. Whole-wheat bread. [place]. Vegetables: Artichokes. Beets. Broccoli. Cabbage. Carrots. Eggplant. Green beans. Chard. Kale. Spinach. Onions. Leeks. Peas. Squash. Tomatoes. Peppers. Radishes. Fruits: Apples. Apricots. Avocado. Berries. Bananas. Cherries. Dates. Figs. Grapes. Lemons. Melon. Oranges. Peaches. Plums. Pomegranate. Meats and other protein foods: Beans. Almonds. Sunflower seeds. Pine nuts. Peanuts. [place]. Salmon. Scallops. Shrimp. Garland. Tilapia. Clams. Oysters. Eggs. Dairy: Low-fat milk. Cheese. Greek yogurt. Beverages: Water. Red wine. Herbal tea. Fats and oils: Extra virgin olive oil. Avocado oil. Grape seed oil. Sweets and desserts: [place] yogurt with honey. Baked apples. Poached pears. Trail mix. Seasoning and other foods: Basil. Cilantro. Coriander. Cumin. Mint.  Parsley. Sage. Rosemary. Tarragon. Garlic. Oregano. Thyme. Pepper. Balsalmic vinegar. Tahini. Hummus. Tomato sauce. Olives. Mushrooms. Limit these Grains: Prepackaged pasta or rice dishes. Prepackaged cereal with added sugar. Vegetables: Deep fried potatoes (french fries). Fruits: Fruit canned in syrup. Meats and other protein foods: Beef. Pork. Lamb. Poultry with skin. Hot dogs. [place] Salines. Dairy: Ice cream. Sour cream. Whole milk. Beverages: Juice. Sugar-sweetened soft drinks. Beer. Liquor and spirits. Fats and oils: Butter. Canola oil. Vegetable oil. Beef fat (tallow). Lard. Sweets and desserts: Cookies. Cakes. Pies. Candy. Seasoning and other foods: Mayonnaise. Premade sauces and marinades. The items listed may not be a complete list. Talk with your dietitian about what dietary choices are right for you. Summary The Mediterranean diet includes both food and lifestyle choices. Eat a variety of fresh fruits and vegetables, beans, nuts, seeds, and whole grains. Limit the amount of red meat and sweets that you eat. Talk with your health care provider about whether it is safe for you to drink red wine in moderation. This means 1 glass a day for nonpregnant women and 2 glasses a day for men. A glass of wine equals 5 oz (150 mL). This information is not intended to replace advice given to you by your health care provider. Make sure you discuss any questions you have with your health care provider. Document Released: 01/24/2016 Document  Revised: 02/26/2016 Document Reviewed: 01/24/2016 Elsevier Interactive Patient Education  2017 [employer].    We have sent a referral to [facility] for your MRI and they will call you directly to schedule your appointment. They are located at [address] [facility]. If you need to contact them directly please call [phone].  Your provider has requested that you have labwork completed today. Please go to [facility] ([id number]) on the second floor  of this building before leaving the office today. You do not need to check in. If you are not called within 15 minutes please check with the front desk.

## 2022-02-24 NOTE — Progress Notes (Signed)
Assessment/Plan:    The patient is seen in neurologic consultation at the request of Chilton Si, MD for the evaluation of memory.  Stephen Chavez is a very pleasant 88 y.o. 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 MoCA today is 22 /30 .  Etiology of his mild cognitive impairment is likely vascular in nature, as well as his other comorbidities including age, chronic anemia, among others.  He is not on antidementia medication  Mild cognitive impairment, likely vascular and multifactorial MRI brain without contrast to assess for underlying structural abnormality and assess vascular load  Check B12, TSH Continue good control of cardiovascular risk factors, continue aspirin Start memantine 5 mg nightly side effects discussed Monitor driving [place] up   in 1 month  Subjective:    The patient is accompanied by his wife Stephen Chavez who supplements the history.    How long did patient have memory difficulties?  Patient states that these memory issues may have started about 3 months ago, but wife reports they have been making it to 1 year, having slowed down since May.  Short-term memory is worse than long-term memory.  At times, he reports knowing the word, but cannot retrieve it.  He is to be more active, is increasing his activity, he notes feeling tired, does not want to participate in the events that priorly he used to enjoy.  Unresolved ".  Patient lives with: Spouse  repeats oneself? endorsed Disoriented when walking into a room?  Patient denies .  Leaving objects in unusual places?  Patient denies   Ambulates  with difficulty?   Patient denies   Recent falls?  Patient denies    Any head injuries?  Patient denies   History of seizures?   Patient denies   Wandering behavior?  Patient denies   Patient drives?   Endorsed.  His wife reports that sometimes he gets confused with driving.  Since May he only.  Short distances  Any mood changes such irritability agitation?  Patient denies sometimes he gets "ST ", for example yesterday, he has his wife were already, come home but he said that he may leave it until then his usual "sweet self ".   Any history of depression?:  Endorsed.  Patient lost his son in February of this year, which has proxy depression to him.  Hallucinations?  Patient denies   Paranoia?  Patient denies   Patient reports that sleeps well without vivid dreams, REM behavior or sleepwalking    History of sleep apnea?  Denies  Any hygiene concerns?  Endorsed, sometimes she has to remind him to take a shower.   Independent of bathing and dressing?  Endorsed  Does the patient needs help with medications? Wife in charge  Who is in charge of the finances?  Wife is in charge   Any changes in appetite?  Patient denies   Patient have trouble swallowing? Patient denies   Does the patient cook?  Patient denies  . Wife does it  Any kitchen accidents such as leaving the stove on? Patient denies   Any headaches?  Patient denies   Double vision? Patient denies.  He has a history of cataracts right greater  than left, with subsequent decrease in vision. Any focal numbness or tingling?  Patient denies   Chronic back pain Patient denies   Unilateral weakness?  Patient denies   Any tremors?  Patient denies   Any history of anosmia? Taste? Endorsed since 2018  Any incontinence of urine?  Patient denies   Any bowel dysfunction? Constipation due to Iron supplementation  History of heavy alcohol intake?  Patient denies   History of heavy tobacco use?  Patient denies   Family history of dementia? His mother had possibly Alzheimer's disease    Allergies  Allergen Reactions    Allopurinol Itching   Colchicine Itching    Current Outpatient Medications  Medication Instructions   acetaminophen (TYLENOL) 240 mg, Oral, Every 6 hours PRN   amLODipine (NORVASC) 5 mg, Oral, Daily at bedtime   cetirizine (ZYRTEC) 10 mg, Oral, Daily PRN   febuxostat (ULORIC) 40 mg, Oral, Daily PRN   ferrous sulfate 325 mg, Oral, Daily with breakfast   METAMUCIL FIBER PO 12 g, Oral, See admin instructions, Mix 12 grams of powder into 6-8 ounces of apple juice and drink once a day   mirtazapine (REMERON) 3.75-7.5 mg, Oral, See admin instructions, Starting on 07/28/2021, take 3.75 mg by mouth at bedtime and increase to 7.5 mg after 1 week   MUCINEX FAST-MAX CONGEST COUGH 2.5-5-100 MG/5ML LIQD 5 mLs, Oral, Every 12 hours PRN   Multiple Vitamins-Minerals (CENTRUM SILVER) CHEW 1-2 tablets, Oral, Daily with breakfast   pantoprazole (PROTONIX) 40 mg, Oral, Daily   rosuvastatin (CRESTOR) 40 mg, Oral, Daily   senna (SENOKOT) 8.6 MG TABS tablet 1 tablet, Oral, Daily     VITALS:  There were no vitals filed for this visit.    07/15/2017    3:54 PM 08/02/2016   10:31 AM  Depression screen PHQ 2/9  Decreased Interest 0 0  Down, Depressed, Hopeless 0 0  PHQ - 2 Score 0 0    PHYSICAL EXAM   HEENT:  Normocephalic, atraumatic. The mucous membranes are moist. The superficial temporal arteries are without ropiness or tenderness. Cardiovascular: Regular rate and rhythm. Lungs: Clear to auscultation bilaterally. Neck: There are no carotid bruits noted bilaterally.  NEUROLOGICAL:     No data to display              No data to display           Orientation:  Alert and oriented to person, place and time. No aphasia or dysarthria. Fund of knowledge is appropriate. Recent memory impaired and remote memory intact.  Attention and concentration are normal.  Able to name objects and repeat phrases. Delayed recall  2/5 Cranial nerves: There is good facial symmetry. Extraocular muscles are  intact and visual fields are full to confrontational testing on the L. On the R visual field decreased . Speech is fluent and clear. Soft palate rises symmetrically and there is no tongue deviation. Hearing is intact to conversational tone. Tone: Tone is good throughout. Sensation: Sensation is intact to light touch and pinprick throughout. Vibration is intact at the bilateral big toe.There is no extinction with double simultaneous stimulation. There is no sensory dermatomal level identified. Coordination: The patient has no difficulty with RAM's or FNF bilaterally. Normal finger to nose  Motor: Strength is 5/5 in the bilateral upper and lower extremities. There is no pronator drift. There are no fasciculations noted. DTR's: Deep tendon reflexes are 2/4 at the bilateral biceps, triceps, brachioradialis, patella and achilles.  Plantar responses are  downgoing bilaterally. Gait and Station: The patient is able to ambulate without difficulty.The patient is able to heel toe walk without any difficulty.The patient is able to ambulate in a tandem fashion. The patient is able to stand in the Romberg position.     Thank you for allowing [place] the opportunity to participate in the care of this nice patient. Please do not hesitate to contact us for any questions or concerns.   Total time spent on today's visit was 50 minutes dedicated to this patient today, preparing to see patient, examining the patient, ordering tests and/or medications and counseling the patient, documenting clinical information in the EHR or other health record, independently interpreting results and communicating results to the patient/family, discussing treatment and goals, answering patient's questions and coordinating care.  Cc:  [name], MD  Marlowe Kays 02/24/2022 7:37 AM

## 2022-02-25 NOTE — Progress Notes (Signed)
Labs are normal thanks

## 2022-03-10 ENCOUNTER — Encounter (HOSPITAL_BASED_OUTPATIENT_CLINIC_OR_DEPARTMENT_OTHER): Payer: Self-pay | Admitting: Cardiovascular Disease

## 2022-03-18 ENCOUNTER — Ambulatory Visit
Admission: RE | Admit: 2022-03-18 | Discharge: 2022-03-18 | Disposition: A | Discharge status: Home / Self Care | Payer: Medicare Other | Source: Ambulatory Visit | Attending: Physician Assistant | Admitting: Physician Assistant

## 2022-03-18 DIAGNOSIS — R413 Other amnesia: Secondary | ICD-10-CM

## 2022-03-19 NOTE — Progress Notes (Signed)
MRI brain shows some age related changes in the circulation and some atrophy in the brain. No acute findings, thanks

## 2022-03-20 ENCOUNTER — Telehealth: Payer: Self-pay | Admitting: Physician Assistant

## 2022-03-20 NOTE — Telephone Encounter (Signed)
Called and left a detailed message and said to call us it needed.

## 2022-03-20 NOTE — Telephone Encounter (Signed)
The following message was left with [facility] on 03/19/22 at 4:52 PM.  Caller states that he is calling to get the results of the test he had done yesterday

## 2022-03-27 ENCOUNTER — Ambulatory Visit: Payer: Medicare Other | Admitting: Physician Assistant

## 2022-03-27 ENCOUNTER — Encounter: Payer: Self-pay | Admitting: Physician Assistant

## 2022-03-27 VITALS — BP 101/63 | HR 88 | Resp 18 | Ht 72.0 in | Wt 167.0 lb

## 2022-03-27 DIAGNOSIS — G3184 Mild cognitive impairment, so stated: Secondary | ICD-10-CM

## 2022-03-27 NOTE — Patient Instructions (Addendum)
It was a pleasure to see you today at our office.   Recommendations:  Continue memantine  5 mg at night  Follow up in  6 months  Side effects were discussed  Monitor driving Recommend good control of cardiovascular risk factors.   [facility]  Continue to control mood as per primary doctor   Whom to call:  Memory  decline, memory medications: Call our office [phone]   For psychiatric meds, mood meds: Please have your primary care physician manage these medications.   Counseling regarding caregiver distress, including caregiver depression, anxiety and issues regarding community resources, adult day care programs, adult living facilities, or memory care questions:   Feel free to contact Misty Lisabeth Register, Social Worker at [phone]   For assessment of decision of mental capacity and competency:  Call Dr. Erick Blinks, geriatric psychiatrist at [phone]  For guidance in geriatric dementia issues please call [facility] [phone]  For guidance regarding [facility] and if placement were needed at the facility, contact Sidney Ace, Social Worker tel: [phone]  If you have any severe symptoms of a stroke, or other severe issues such as confusion,severe chills or fever, etc call 911 or go to the ER as you may need to be evaluated further   Feel free to visit Facebook page " [email or web address]" for tips of how to care for people with memory problems.       RECOMMENDATIONS FOR ALL PATIENTS WITH MEMORY PROBLEMS: 1. Continue to exercise (Recommend 30 minutes of walking everyday, or 3 hours every week) 2. Increase social interactions - continue going to [place] and enjoy social gatherings with friends and family 3. Eat healthy, avoid fried foods and eat more fruits and vegetables 4. Maintain adequate blood pressure, blood sugar, and blood cholesterol level. Reducing the risk of stroke and  cardiovascular disease also helps promoting better memory. 5. Avoid stressful situations. Live a simple life and avoid aggravations. Organize your time and prepare for the next day in anticipation. 6. Sleep well, avoid any interruptions of sleep and avoid any distractions in the bedroom that may interfere with adequate sleep quality 7. Avoid sugar, avoid sweets as there is a strong link between excessive sugar intake, diabetes, and cognitive impairment We discussed the Mediterranean diet, which has been shown to help patients reduce the risk of progressive memory disorders and reduces cardiovascular risk. This includes eating fish, eat fruits and green leafy vegetables, nuts like almonds and hazelnuts, walnuts, and also use olive oil. Avoid fast foods and fried foods as much as possible. Avoid sweets and sugar as sugar use has been linked to worsening of memory function.  There is always a concern of gradual progression of memory problems. If this is the case, then we may need to adjust level of care according to patient needs. Support, both to the patient and caregiver, should then be put into place.     FALL PRECAUTIONS: Be cautious when walking. Scan the area for obstacles that may increase the risk of trips and falls. When getting up in the mornings, sit up at the edge of the bed for a few minutes before getting out of bed. Consider elevating the bed at the head end to avoid drop of blood pressure when getting up. Walk always in a well-lit room (use night lights in the walls). Avoid area rugs or power cords from appliances in the middle of the walkways. Use a walker or  a cane if necessary and consider physical therapy for balance exercise. Get your eyesight checked regularly.  FINANCIAL OVERSIGHT: Supervision, especially oversight when making financial decisions or transactions is also recommended.  HOME SAFETY: Consider the safety of the kitchen when operating appliances like stoves, microwave  oven, and blender. Consider having supervision and share cooking responsibilities until no longer able to participate in those. Accidents with firearms and other hazards in the house should be identified and addressed as well.   ABILITY TO BE LEFT ALONE: If patient is unable to contact 911 operator, consider using LifeLine, or when the need is there, arrange for someone to stay with patients. Smoking is a fire hazard, consider supervision or cessation. Risk of wandering should be assessed by caregiver and if detected at any point, supervision and safe proof recommendations should be instituted.  MEDICATION SUPERVISION: Inability to self-administer medication needs to be constantly addressed. Implement a mechanism to ensure safe administration of the medications.   DRIVING: Regarding driving, in patients with progressive memory problems, driving will be impaired. We advise to have someone else do the driving if trouble finding directions or if minor accidents are reported. Independent driving assessment is available to determine safety of driving.   If you are interested in the driving assessment, you can contact the following:  [facility] [employer] [facility] in [place] [phone]  [facility] [phone]  [facility] [phone] or [phone]    Mediterranean Diet A Mediterranean diet refers to food and lifestyle choices that are based on the traditions of countries located on the [employer]. This way of eating has been shown to help prevent certain conditions and improve outcomes for people who have chronic diseases, like kidney disease and heart disease. What are tips for following this plan? Lifestyle  Cook and eat meals together with your family, when possible. Drink enough fluid to keep your urine clear or pale yellow. Be physically active every day. This includes: Aerobic exercise like running or  swimming. Leisure activities like gardening, walking, or housework. Get 7-8 hours of sleep each night. If recommended by your health care provider, drink red wine in moderation. This means 1 glass a day for nonpregnant women and 2 glasses a day for men. A glass of wine equals 5 oz (150 mL). Reading food labels  Check the serving size of packaged foods. For foods such as rice and pasta, the serving size refers to the amount of cooked product, not dry. Check the total fat in packaged foods. Avoid foods that have saturated fat or trans fats. Check the ingredients list for added sugars, such as corn syrup. Shopping  At the grocery store, buy most of your food from the areas near the walls of the store. This includes: Fresh fruits and vegetables (produce). Grains, beans, nuts, and seeds. Some of these may be available in unpackaged forms or large amounts (in bulk). Fresh seafood. Poultry and eggs. Low-fat dairy products. Buy whole ingredients instead of prepackaged foods. Buy fresh fruits and vegetables in-season from local farmers markets. Buy frozen fruits and vegetables in resealable bags. If you do not have access to quality fresh seafood, buy precooked frozen shrimp or canned fish, such as tuna, salmon, or sardines. Buy small amounts of raw or cooked vegetables, salads, or olives from the deli or salad bar at your store. Stock your pantry so you always have certain foods on hand, such as olive oil, canned tuna, canned tomatoes, rice, pasta, and beans. Cooking  Cook foods with extra-virgin olive oil instead of using butter or other vegetable oils. Have meat as a side dish, and have vegetables or grains as your main dish. This means having meat in small portions or adding small amounts of meat to foods like pasta or stew. Use beans or vegetables instead of meat in common dishes like chili or lasagna. Experiment with different cooking methods. Try roasting or broiling vegetables instead of  steaming or sauteing them. Add frozen vegetables to soups, stews, pasta, or rice. Add nuts or seeds for added healthy fat at each meal. You can add these to yogurt, salads, or vegetable dishes. Marinate fish or vegetables using olive oil, lemon juice, garlic, and fresh herbs. Meal planning  Plan to eat 1 vegetarian meal one day each week. Try to work up to 2 vegetarian meals, if possible. Eat seafood 2 or more times a week. Have healthy snacks readily available, such as: Vegetable sticks with hummus. Greek yogurt. Fruit and nut trail mix. Eat balanced meals throughout the week. This includes: Fruit: 2-3 servings a day Vegetables: 4-5 servings a day Low-fat dairy: 2 servings a day Fish, poultry, or lean meat: 1 serving a day Beans and legumes: 2 or more servings a week Nuts and seeds: 1-2 servings a day Whole grains: 6-8 servings a day Extra-virgin olive oil: 3-4 servings a day Limit red meat and sweets to only a few servings a month What are my food choices? Mediterranean diet Recommended Grains: Whole-grain pasta. Brown rice. Bulgar wheat. Polenta. Couscous. Whole-wheat bread. [place]. Vegetables: Artichokes. Beets. Broccoli. Cabbage. Carrots. Eggplant. Green beans. Chard. Kale. Spinach. Onions. Leeks. Peas. Squash. Tomatoes. Peppers. Radishes. Fruits: Apples. Apricots. Avocado. Berries. Bananas. Cherries. Dates. Figs. Grapes. Lemons. Melon. Oranges. Peaches. Plums. Pomegranate. Meats and other protein foods: Beans. Almonds. Sunflower seeds. Pine nuts. Peanuts. [place]. Salmon. Scallops. Shrimp. [place]. Tilapia. Clams. Oysters. Eggs. Dairy: Low-fat milk. Cheese. Greek yogurt. Beverages: Water. Red wine. Herbal tea. Fats and oils: Extra virgin olive oil. Avocado oil. Grape seed oil. Sweets and desserts: [place] yogurt with honey. Baked apples. Poached pears. Trail mix. Seasoning and other foods: Basil. Cilantro. Coriander. Cumin. Mint. Parsley. Sage. Rosemary. Tarragon. Garlic.  Oregano. Thyme. Pepper. Balsalmic vinegar. Tahini. Hummus. Tomato sauce. Olives. Mushrooms. Limit these Grains: Prepackaged pasta or rice dishes. Prepackaged cereal with added sugar. Vegetables: Deep fried potatoes (french fries). Fruits: Fruit canned in syrup. Meats and other protein foods: Beef. Pork. Lamb. Poultry with skin. Hot dogs. [place] Salines. Dairy: Ice cream. Sour cream. Whole milk. Beverages: Juice. Sugar-sweetened soft drinks. Beer. Liquor and spirits. Fats and oils: Butter. Canola oil. Vegetable oil. Beef fat (tallow). Lard. Sweets and desserts: Cookies. Cakes. Pies. Candy. Seasoning and other foods: Mayonnaise. Premade sauces and marinades. The items listed may not be a complete list. Talk with your dietitian about what dietary choices are right for you. Summary The Mediterranean diet includes both food and lifestyle choices. Eat a variety of fresh fruits and vegetables, beans, nuts, seeds, and whole grains. Limit the amount of red meat and sweets that you eat. Talk with your health care provider about whether it is safe for you to drink red wine in moderation. This means 1 glass a day for nonpregnant women and 2 glasses a day for men. A glass of wine equals 5 oz (150 mL). This information is not intended to replace advice given to you by your health care provider. Make sure you discuss any questions you have with your health care provider. Document Released: 01/24/2016 Document  Revised: 02/26/2016 Document Reviewed: 01/24/2016 Elsevier Interactive Patient Education  2017 [employer].    We have sent a referral to [facility] for your MRI and they will call you directly to schedule your appointment. They are located at [address] [facility]. If you need to contact them directly please call [phone].  Your provider has requested that you have labwork completed today. Please go to [facility] ([id number]) on the second floor of this building before leaving the office  today. You do not need to check in. If you are not called within 15 minutes please check with the front desk.

## 2022-03-27 NOTE — Progress Notes (Signed)
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 These were personally reviewed, remarkable for mild chronic small vessel ischemic changes within the cerebral white matter, slightly progressed from the prior MRI of 09/19/2016. Mild-to-moderate generalized cerebral atrophy and mild cerebellar atrophy.  Patient is currently on memantine 5 mg  nightly, tolerating well. Last MoCA on 02/24/22 was 22/30.  No new stroke like signs or symptoms. 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 Continue Memantine 5 mg nightly. Side effects were discussed  Monitor driving Recommend good control of cardiovascular risk factors.   Continue [facility]  Continue to control mood as per PCP    Subjective:    This patient is accompanied in the office by his wife and his daughter  who supplements the history.  Previous records as well as any outside records available were reviewed prior to todays visit.    Any changes in memory since last visit? denies Tolerating meds? Endorsed   Initial Visit 02/24/22   How long did patient have memory difficulties?  Patient states that these memory issues may have started about 3 months ago, but wife reports they have been making it to 1 year, having slowed down since May.  Short-term memory is worse than long-term memory.  At  times, he reports knowing the word, but cannot retrieve it.  He is to be more active, is increasing his activity, he notes feeling tired, does not want to participate in the events that priorly he used to enjoy.".  Patient lives with: Spouse  repeats oneself? endorsed Disoriented when walking into a room?  Patient denies .  Leaving objects in unusual places?  Patient denies   Ambulates  with difficulty?   Patient denies   Recent falls?  Patient denies   Any head injuries?  Patient denies   History of seizures?   Patient denies   Wandering behavior?  Patient denies   Patient drives?   Endorsed.  His wife reports that sometimes he gets confused with driving.  Since May he only.  Short distances  Any mood changes such irritability agitation?  Patient denies sometimes he gets " testy " Any history of depression?:  Endorsed.  Patient lost his son in February of this year, which has proxy depression to him.  Hallucinations?  Patient denies   Paranoia?  Patient denies   Patient reports that sleeps well without vivid dreams, REM behavior or sleepwalking    History of sleep apnea?  Denies  Any hygiene concerns?  Endorsed, sometimes she has to remind him to take a shower.   Independent of bathing and dressing?  Endorsed  Does the patient needs help with medications? Wife in charge  Who is in charge of the finances?  Wife is in charge   Any changes in appetite?  Patient denies   Patient have trouble swallowing? Patient denies  Does the patient cook?  Patient denies  . Wife does it  Any kitchen accidents such as leaving the stove on? Patient denies   Any headaches?  Patient denies   Double vision? Patient denies.  He has a history of cataracts right greater than left, with subsequent decrease in vision. Any focal numbness or tingling?  Patient denies   Chronic back pain Patient denies   Unilateral weakness?  Patient denies   Any tremors?  Patient denies   Any history of anosmia? Taste? Endorsed  since 2018  Any incontinence of urine?  Patient denies   Any bowel dysfunction? Constipation due to Iron supplementation  History of heavy alcohol intake?  Patient denies   History of heavy tobacco use?  Patient denies   Family history of dementia? His mother had possibly Alzheimer's disease     03/18/22 MRI brain was remarkable for mild chronic small vessel ischemic changes within the cerebral white matter, slightly progressed from the prior MRI of 09/19/2016. Mild-to-moderate generalized cerebral atrophy and mild cerebellar atrophy.  Pertinent labs September 2023: Vitamin B12 439 TSH 1.57  CURRENT MEDICATIONS:  Outpatient Encounter Medications as of 03/27/2022  Medication Sig   acetaminophen (TYLENOL) 160 MG/5ML suspension Take 240 mg by mouth every 6 (six) hours as needed for mild pain or headache.   amLODipine (NORVASC) 5 MG tablet Take 5 mg by mouth at bedtime.   cetirizine (ZYRTEC) 10 MG tablet Take 10 mg by mouth daily as needed for allergies or rhinitis.   febuxostat (ULORIC) 40 MG tablet Take 40 mg by mouth daily as needed (for gout flares).   ferrous sulfate 325 (65 FE) MG tablet Take 1 tablet (325 mg total) by mouth daily with breakfast.   memantine (NAMENDA) 5 MG tablet Take 1 tablet (5 mg total) by mouth at bedtime.   METAMUCIL FIBER PO Take 12 g by mouth See admin instructions. Mix 12 grams of powder into 6-8 ounces of apple juice and drink once a day   mirtazapine (REMERON) 7.5 MG tablet Take 3.75-7.5 mg by mouth See admin instructions. Starting on 07/28/2021, take 3.75 mg by mouth at bedtime and increase to 7.5 mg after 1 week   MUCINEX FAST-MAX CONGEST COUGH 2.5-5-100 MG/5ML LIQD Take 5 mLs by mouth every 12 (twelve) hours as needed (for coughing). (Patient not taking: Reported on 02/24/2022)   Multiple Vitamins-Minerals (CENTRUM SILVER) CHEW Chew 1-2 tablets by mouth daily with breakfast.   pantoprazole (PROTONIX) 40 MG tablet Take 1 tablet (40 mg total) by mouth daily.  (Patient taking differently: Take 40 mg by mouth daily before breakfast.)   rosuvastatin (CRESTOR) 40 MG tablet Take 1 tablet (40 mg total) by mouth daily. (Patient taking differently: Take 40 mg by mouth at bedtime.)   senna (SENOKOT) 8.6 MG TABS tablet Take 1 tablet by mouth daily.   No facility-administered encounter medications on file as of 03/27/2022.        No data to display            02/24/2022    2:00 PM  Montreal Cognitive Assessment   Visuospatial/ Executive (0/5) 4  Naming (0/3) 2  Attention: Read list of digits (0/2) 2  Attention: Read list of letters (0/1) 1  Attention: Serial 7 subtraction starting at 100 (0/3) 1  Language: Repeat phrase (0/2) 2  Language : Fluency (0/1) 0  Abstraction (0/2) 2  Delayed Recall (0/5) 2  Orientation (0/6) 6  Total 22  Adjusted Score (based on education) 22  Thank you for allowing [place] the opportunity to participate in the care of this nice patient. Please do not hesitate to contact us for any questions or concerns.   Total time spent on today's visit was 20 minutes dedicated to this patient today, preparing to see patient, examining the patient, ordering tests and/or medications and counseling the patient, documenting clinical information in the EHR or other health record, independently interpreting results and communicating results to the patient/family, discussing treatment and goals, answering patient's questions and coordinating care.  Cc:  [name], MD  Marlowe Kays 03/27/2022 6:38 AM

## 2022-05-06 ENCOUNTER — Encounter (HOSPITAL_BASED_OUTPATIENT_CLINIC_OR_DEPARTMENT_OTHER): Payer: Self-pay

## 2022-06-04 ENCOUNTER — Other Ambulatory Visit (HOSPITAL_BASED_OUTPATIENT_CLINIC_OR_DEPARTMENT_OTHER): Payer: Self-pay | Admitting: Cardiovascular Disease

## 2022-06-04 NOTE — Telephone Encounter (Signed)
Rx request sent to pharmacy.  

## 2022-06-19 ENCOUNTER — Encounter: Payer: Self-pay | Admitting: Internal Medicine

## 2022-06-26 ENCOUNTER — Other Ambulatory Visit: Payer: Self-pay

## 2022-06-26 ENCOUNTER — Inpatient Hospital Stay (HOSPITAL_BASED_OUTPATIENT_CLINIC_OR_DEPARTMENT_OTHER): Payer: Medicare HMO | Admitting: Internal Medicine

## 2022-06-26 ENCOUNTER — Inpatient Hospital Stay: Payer: Medicare HMO | Attending: Internal Medicine

## 2022-06-26 VITALS — BP 106/65 | HR 75 | Temp 97.7°F | Resp 15 | Wt 160.7 lb

## 2022-06-26 DIAGNOSIS — D508 Other iron deficiency anemias: Secondary | ICD-10-CM

## 2022-06-26 DIAGNOSIS — K5520 Angiodysplasia of colon without hemorrhage: Secondary | ICD-10-CM | POA: Diagnosis present

## 2022-06-26 DIAGNOSIS — D500 Iron deficiency anemia secondary to blood loss (chronic): Secondary | ICD-10-CM | POA: Diagnosis not present

## 2022-06-26 DIAGNOSIS — Z79899 Other long term (current) drug therapy: Secondary | ICD-10-CM | POA: Insufficient documentation

## 2022-06-26 LAB — CBC WITH DIFFERENTIAL (CANCER CENTER ONLY)
Abs Immature Granulocytes: 0.03 10*3/uL (ref 0.00–0.07)
Basophils Absolute: 0 10*3/uL (ref 0.0–0.1)
Basophils Relative: 0 %
Eosinophils Absolute: 0.3 10*3/uL (ref 0.0–0.5)
Eosinophils Relative: 4 %
HCT: 28.8 % — ABNORMAL LOW (ref 39.0–52.0)
Hemoglobin: 9.6 g/dL — ABNORMAL LOW (ref 13.0–17.0)
Immature Granulocytes: 0 %
Lymphocytes Relative: 32 %
Lymphs Abs: 2.4 10*3/uL (ref 0.7–4.0)
MCH: 28.3 pg (ref 26.0–34.0)
MCHC: 33.3 g/dL (ref 30.0–36.0)
MCV: 85 fL (ref 80.0–100.0)
Monocytes Absolute: 0.5 10*3/uL (ref 0.1–1.0)
Monocytes Relative: 7 %
Neutro Abs: 4.4 10*3/uL (ref 1.7–7.7)
Neutrophils Relative %: 57 %
Platelet Count: 273 10*3/uL (ref 150–400)
RBC: 3.39 MIL/uL — ABNORMAL LOW (ref 4.22–5.81)
RDW: 14.8 % (ref 11.5–15.5)
WBC Count: 7.7 10*3/uL (ref 4.0–10.5)
nRBC: 0 % (ref 0.0–0.2)

## 2022-06-26 LAB — FERRITIN: Ferritin: 124 ng/mL (ref 24–336)

## 2022-06-26 LAB — IRON AND IRON BINDING CAPACITY (CC-WL,HP ONLY)
Iron: 49 ug/dL (ref 45–182)
Saturation Ratios: 17 % — ABNORMAL LOW (ref 17.9–39.5)
TIBC: 288 ug/dL (ref 250–450)
UIBC: 239 ug/dL (ref 117–376)

## 2022-06-26 NOTE — Progress Notes (Signed)
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  PRIOR THERAPY: Iron infusion with Ferrlecit weekly for 4 weeks started April 15, 2019  CURRENT THERAPY: Ferrous sulfate 325 mg p.o. daily.  INTERVAL HISTORY: Stephen Chavez 89 y.o. male returns to the clinic today for follow-up visit accompanied by his wife.  The patient continues to complain of generalized fatigue and weakness.  He denied having any current chest pain, shortness of breath, cough or hemoptysis.  He has no nausea, vomiting, diarrhea or constipation.  He has no headache or visual changes.  He has no recent weight loss or night sweats.  He has been tolerating his oral iron tablet fairly well.  The patient is here today for evaluation and repeat blood work.   MEDICAL HISTORY: Past Medical History:  Diagnosis Date   Amaurosis fugax of right eye 09/03/2016   CAD in native artery 07/05/2021   Carotid stenosis 09/03/2016   Mild 05/2016   Chronic combined systolic and diastolic heart failure ([place]) 10/23/2016   Essential hypertension 09/03/2016   Hyperlipidemia    Hypertension    Memory loss 02/18/2022    ALLERGIES:  is allergic to allopurinol and colchicine.  MEDICATIONS:  Current Outpatient Medications  Medication Sig Dispense Refill   acetaminophen (TYLENOL) 160 MG/5ML suspension Take 240 mg by mouth every 6 (six) hours as needed for mild pain or headache.     amLODipine (NORVASC) 5 MG tablet Take 5 mg by mouth at bedtime.     cetirizine (ZYRTEC) 10 MG tablet Take 10 mg by mouth daily as needed for allergies or rhinitis.     febuxostat (ULORIC) 40 MG tablet Take 40 mg by mouth daily as needed (for gout flares).     ferrous sulfate 325 (65 FE) MG tablet Take 1 tablet (325 mg total) by  mouth daily with breakfast.  3   memantine (NAMENDA) 5 MG tablet Take 1 tablet (5 mg total) by mouth at bedtime. 30 tablet 11   METAMUCIL FIBER PO Take 12 g by mouth See admin instructions. Mix 12 grams of powder into 6-8 ounces of apple juice and drink once a day     mirtazapine (REMERON) 7.5 MG tablet Take 3.75-7.5 mg by mouth See admin instructions. Starting on 07/28/2021, take 3.75 mg by mouth at bedtime and increase to 7.5 mg after 1 week     MUCINEX FAST-MAX CONGEST COUGH 2.5-5-100 MG/5ML LIQD Take 5 mLs by mouth every 12 (twelve) hours as needed (for coughing). (Patient not taking: Reported on 02/24/2022)     Multiple Vitamins-Minerals (CENTRUM SILVER) CHEW Chew 1-2 tablets by mouth daily with breakfast.     pantoprazole (PROTONIX) 40 MG tablet Take 1 tablet (40 mg total) by mouth daily. (Patient taking differently: Take 40 mg by mouth daily before breakfast.) 30 tablet 1   rosuvastatin (CRESTOR) 40 MG tablet Take 1 tablet (40 mg total) by mouth at bedtime. 90 tablet 1   senna (SENOKOT) 8.6 MG TABS tablet Take 1 tablet by mouth daily.     No current facility-administered medications for this visit.    SURGICAL HISTORY:  Past Surgical History:  Procedure Laterality Date   BIOPSY  04/01/2019   Procedure: BIOPSY;  Surgeon: Otis Brace, MD;  Location: [facility];  Service: Gastroenterology;;   COLONOSCOPY  WITH PROPOFOL N/A 04/01/2019   Procedure: COLONOSCOPY WITH PROPOFOL;  Surgeon: Otis Brace, MD;  Location: [facility];  Service: Gastroenterology;  Laterality: N/A;   ENTEROSCOPY N/A 04/01/2019   Procedure: Push ENTEROSCOPY;  Surgeon: Otis Brace, MD;  Location: [facility];  Service: Gastroenterology;  Laterality: N/A;   EYE SURGERY     HOT HEMOSTASIS N/A 04/01/2019   Procedure: HOT HEMOSTASIS (ARGON PLASMA COAGULATION/BICAP);  Surgeon: Otis Brace, MD;  Location[facility];  Service: Gastroenterology;  Laterality: N/A;   polyp removal     POLYPECTOMY   04/01/2019   Procedure: POLYPECTOMY;  Surgeon: Otis Brace, MD;  Location: [facility];  Service: Gastroenterology;;    REVIEW OF SYSTEMS:  A comprehensive review of systems was negative except for: Constitutional: positive for fatigue   PHYSICAL EXAMINATION: General appearance: alert, cooperative, fatigued, and no distress Head: Normocephalic, without obvious abnormality, atraumatic Neck: no adenopathy, no JVD, supple, symmetrical, trachea midline, and thyroid not enlarged, symmetric, no tenderness/mass/nodules Lymph nodes: Cervical, supraclavicular, and axillary nodes normal. Resp: clear to auscultation bilaterally Back: symmetric, no curvature. ROM normal. No CVA tenderness. Cardio: regular rate and rhythm, S1, S2 normal, no murmur, click, rub or gallop GI: soft, non-tender; bowel sounds normal; no masses,  no organomegaly Extremities: extremities normal, atraumatic, no cyanosis or edema  ECOG PERFORMANCE STATUS: 1 - Symptomatic but completely ambulatory  Blood pressure 106/65, pulse 75, temperature 97.7 F (36.5 C), temperature source Oral, resp. rate 15, weight 160 lb 11.2 oz (72.9 kg), SpO2 96 %.  LABORATORY DATA: Lab Results  Component Value Date   WBC 7.7 06/26/2022   HGB 9.6 (L) 06/26/2022   HCT 28.8 (L) 06/26/2022   MCV 85.0 06/26/2022   PLT 273 06/26/2022      Chemistry      Component Value Date/Time   NA 138 08/01/2021 0220   K 4.6 08/01/2021 0220   CL 104 08/01/2021 0220   CO2 23 08/01/2021 0220   BUN 18 08/01/2021 0220   CREATININE 2.05 (H) 08/01/2021 0220   CREATININE 1.94 (H) 12/22/2019 1122      Component Value Date/Time   CALCIUM 9.5 08/01/2021 0220   ALKPHOS 59 08/01/2021 0220   AST 17 08/01/2021 0220   AST 13 (L) 12/22/2019 1122   ALT 11 08/01/2021 0220   ALT 17 12/22/2019 1122   BILITOT 0.6 08/01/2021 0220   BILITOT 0.4 12/22/2019 1122       RADIOGRAPHIC STUDIES: No results found.  ASSESSMENT AND PLAN: This is a very pleasant 89  years old African-American male with history of iron deficiency anemia secondary to gastrointestinal blood loss from AV malformation. The patient was treated with intravenous iron infusion with Ferrlecit weekly for 4 weeks.  The patient has been on oral iron tablet with over-the-counter ferrous sulfate with orange juice and he has been tolerating it fairly well. Repeat CBC today showed further decline in his hemoglobin and hematocrit.  His hemoglobin is 9.6 and hematocrit of 28.8%.  Iron study, ferritin are still pending I recommended for the patient to continue with the oral iron tablets for now but I will also arrange for him to receive iron infusion with Venofer 300 Mg IV weekly for 3 weeks at the [facility]. I will see him back for follow-up visit in 6 months for evaluation and repeat blood work. The patient was advised to call immediately if he has any concerning symptoms in the interval.  The patient voices understanding of current disease status and treatment options  and is in agreement with the current care plan. All questions were answered. The patient knows to call the clinic with any problems, questions or concerns. We can certainly see the patient much sooner if necessary.  Disclaimer: This note was dictated with voice recognition software. Similar sounding words can inadvertently be transcribed and may not be corrected upon review.

## 2022-07-02 ENCOUNTER — Other Ambulatory Visit: Payer: Self-pay | Admitting: Pharmacy Technician

## 2022-07-02 ENCOUNTER — Telehealth: Payer: Self-pay | Admitting: Pharmacy Technician

## 2022-07-02 NOTE — Telephone Encounter (Signed)
Auth Submission: NO AUTH NEEDED Payer: [employer] MED[employer]ARE Medication & CPT/J Code(s) submitted: Venofer (Iron Sucrose) J1756 Route of submission (phone, fax, portal):  Phone # Fax # Auth type: Buy/Bill Units/visits requested: 3 Reference number:  Approval from: 07/02/22 to 10/31/22

## 2022-07-03 ENCOUNTER — Encounter: Payer: Self-pay | Admitting: Internal Medicine

## 2022-07-03 ENCOUNTER — Telehealth: Payer: Self-pay | Admitting: Medical Oncology

## 2022-07-03 ENCOUNTER — Other Ambulatory Visit (HOSPITAL_COMMUNITY): Payer: Self-pay

## 2022-07-03 NOTE — Telephone Encounter (Signed)
I returned call and told wife his iron will be scheduled at [place]. She has their phone number to call.

## 2022-07-07 ENCOUNTER — Ambulatory Visit (INDEPENDENT_AMBULATORY_CARE_PROVIDER_SITE_OTHER): Payer: Medicare HMO

## 2022-07-07 VITALS — BP 121/61 | HR 57 | Temp 97.6°F | Resp 18 | Ht 72.0 in | Wt 160.2 lb

## 2022-07-07 DIAGNOSIS — D500 Iron deficiency anemia secondary to blood loss (chronic): Secondary | ICD-10-CM | POA: Diagnosis not present

## 2022-07-07 DIAGNOSIS — K922 Gastrointestinal hemorrhage, unspecified: Secondary | ICD-10-CM

## 2022-07-07 MED ORDER — ACETAMINOPHEN 325 MG PO TABS
650.0000 mg | ORAL_TABLET | Freq: Once | ORAL | Status: AC
  Administered 2022-07-07: 650 mg via ORAL
  Filled 2022-07-07: qty 2

## 2022-07-07 MED ORDER — DIPHENHYDRAMINE HCL 25 MG PO CAPS
25.0000 mg | ORAL_CAPSULE | Freq: Once | ORAL | Status: AC
  Administered 2022-07-07: 25 mg via ORAL
  Filled 2022-07-07: qty 1

## 2022-07-07 MED ORDER — SODIUM CHLORIDE 0.9 % IV SOLN
300.0000 mg | INTRAVENOUS | Status: DC
  Administered 2022-07-07: 300 mg via INTRAVENOUS
  Filled 2022-07-07: qty 15

## 2022-07-07 NOTE — Progress Notes (Signed)
Diagnosis: Iron Deficiency Anemia  Provider:  Marshell Garfinkel MD  Procedure: Infusion  IV Type: Peripheral, IV Location: L Antecubital  Venofer (Iron Sucrose), Dose: 300 mg  Infusion Start Time: 4142  Infusion Stop Time: 3953  Post Infusion IV Care: Peripheral IV Discontinued  Discharge: Condition: Good, Destination: Home . AVS provided to patient.   Performed by:  Adelina Mings, LPN

## 2022-07-14 ENCOUNTER — Ambulatory Visit (INDEPENDENT_AMBULATORY_CARE_PROVIDER_SITE_OTHER): Payer: Medicare HMO | Admitting: *Deleted

## 2022-07-14 VITALS — BP 111/52 | HR 61 | Temp 97.6°F | Resp 16 | Ht 72.0 in | Wt 161.0 lb

## 2022-07-14 DIAGNOSIS — D500 Iron deficiency anemia secondary to blood loss (chronic): Secondary | ICD-10-CM | POA: Diagnosis not present

## 2022-07-14 MED ORDER — DIPHENHYDRAMINE HCL 25 MG PO CAPS: 25.0000 mg | ORAL_CAPSULE | Freq: Once | ORAL | Status: DC

## 2022-07-14 MED ORDER — SODIUM CHLORIDE 0.9 % IV SOLN
300.0000 mg | INTRAVENOUS | Status: DC
  Administered 2022-07-14: 300 mg via INTRAVENOUS
  Filled 2022-07-14: qty 15

## 2022-07-14 MED ORDER — ACETAMINOPHEN 325 MG PO TABS: 650.0000 mg | ORAL_TABLET | Freq: Once | ORAL | Status: DC

## 2022-07-14 NOTE — Progress Notes (Signed)
Diagnosis: Iron Deficiency Anemia  Provider:  Marshell Garfinkel MD  Procedure: Infusion  IV Type: Peripheral, IV Location: L Forearm  Venofer (Iron Sucrose), Dose: 300 mg  Infusion Start Time: 1318 pm  Infusion Stop Time: 1505 pm  Post Infusion IV Care: Observation period completed and Peripheral IV Discontinued  Discharge: Condition: Good, Destination: Home . AVS provided to patient.   Performed by:  Oren Beckmann, RN

## 2022-07-21 ENCOUNTER — Ambulatory Visit (INDEPENDENT_AMBULATORY_CARE_PROVIDER_SITE_OTHER): Payer: Medicare HMO

## 2022-07-21 VITALS — BP 102/56 | HR 59 | Temp 97.9°F | Resp 16 | Ht 72.0 in | Wt 161.4 lb

## 2022-07-21 DIAGNOSIS — D500 Iron deficiency anemia secondary to blood loss (chronic): Secondary | ICD-10-CM

## 2022-07-21 MED ORDER — SODIUM CHLORIDE 0.9 % IV SOLN
300.0000 mg | INTRAVENOUS | Status: DC
  Administered 2022-07-21: 300 mg via INTRAVENOUS
  Filled 2022-07-21: qty 15

## 2022-07-21 MED ORDER — ACETAMINOPHEN 325 MG PO TABS: 650.0000 mg | ORAL_TABLET | Freq: Once | ORAL | Status: DC

## 2022-07-21 MED ORDER — DIPHENHYDRAMINE HCL 25 MG PO CAPS: 25.0000 mg | ORAL_CAPSULE | Freq: Once | ORAL | Status: DC

## 2022-07-21 NOTE — Progress Notes (Signed)
Diagnosis: Iron Deficiency Anemia  Provider:  Marshell Garfinkel MD  Procedure: Infusion  IV Type: Peripheral, IV Location: L Forearm  Venofer (Iron Sucrose), Dose: 300 mg  Infusion Start Time: 2330  Infusion Stop Time: 1528  Post Infusion IV Care: Peripheral IV Discontinued  Discharge: Condition: Good, Destination: Home . AVS provided to patient.   Performed by:  Adelina Mings, LPN

## 2022-08-11 ENCOUNTER — Telehealth: Payer: Self-pay | Admitting: Medical Oncology

## 2022-08-11 NOTE — Telephone Encounter (Signed)
Saw Nephrology -Wife understood that pt may start on Epogen . ( Dr Carolin Sicks ). Is there anything else besides keep his  6  month f/u?

## 2022-08-14 ENCOUNTER — Encounter: Payer: Self-pay | Admitting: Physician Assistant

## 2022-09-26 ENCOUNTER — Ambulatory Visit: Payer: Medicare Other | Admitting: Physician Assistant

## 2022-10-16 ENCOUNTER — Encounter: Payer: Self-pay | Admitting: Physician Assistant

## 2022-10-16 ENCOUNTER — Ambulatory Visit: Payer: Medicare HMO | Admitting: Physician Assistant

## 2022-10-16 ENCOUNTER — Other Ambulatory Visit: Payer: Self-pay | Admitting: Gastroenterology

## 2022-10-16 VITALS — BP 116/67 | HR 80 | Resp 20 | Ht 72.0 in | Wt 159.0 lb

## 2022-10-16 DIAGNOSIS — K8689 Other specified diseases of pancreas: Secondary | ICD-10-CM

## 2022-10-16 DIAGNOSIS — G3184 Mild cognitive impairment, so stated: Secondary | ICD-10-CM

## 2022-10-16 NOTE — Patient Instructions (Addendum)
It was a pleasure to see you today at our office.   Recommendations:  Continue memantine  5 mg at night  Follow up in  6 months  Side effects were discussed  Monitor driving Recommend good control of cardiovascular risk factors.   [facility]  Continue to control mood as per primary doctor   Whom to call:  Memory  decline, memory medications: Call our office [phone]   For psychiatric meds, mood meds: Please have your primary care physician manage these medications.    For assessment of decision of mental capacity and competency:  Call Dr. Erick Blinks, geriatric psychiatrist at [phone]  For guidance in geriatric dementia issues please call [facility] [phone]  For guidance regarding [facility] and if placement were needed at the facility, contact Sidney Ace, Social Worker tel: [phone]  If you have any severe symptoms of a stroke, or other severe issues such as confusion,severe chills or fever, etc call 911 or go to the ER as you may need to be evaluated further   Feel free to visit Facebook page " [email or web address]" for tips of how to care for people with memory problems.       RECOMMENDATIONS FOR ALL PATIENTS WITH MEMORY PROBLEMS: 1. Continue to exercise (Recommend 30 minutes of walking everyday, or 3 hours every week) 2. Increase social interactions - continue going to [place] and enjoy social gatherings with friends and family 3. Eat healthy, avoid fried foods and eat more fruits and vegetables 4. Maintain adequate blood pressure, blood sugar, and blood cholesterol level. Reducing the risk of stroke and cardiovascular disease also helps promoting better memory. 5. Avoid stressful situations. Live a simple life and avoid aggravations. Organize your time and prepare for the next day in anticipation. 6. Sleep well, avoid any interruptions of sleep and avoid any distractions in the  bedroom that may interfere with adequate sleep quality 7. Avoid sugar, avoid sweets as there is a strong link between excessive sugar intake, diabetes, and cognitive impairment We discussed the Mediterranean diet, which has been shown to help patients reduce the risk of progressive memory disorders and reduces cardiovascular risk. This includes eating fish, eat fruits and green leafy vegetables, nuts like almonds and hazelnuts, walnuts, and also use olive oil. Avoid fast foods and fried foods as much as possible. Avoid sweets and sugar as sugar use has been linked to worsening of memory function.  There is always a concern of gradual progression of memory problems. If this is the case, then we may need to adjust level of care according to patient needs. Support, both to the patient and caregiver, should then be put into place.     FALL PRECAUTIONS: Be cautious when walking. Scan the area for obstacles that may increase the risk of trips and falls. When getting up in the mornings, sit up at the edge of the bed for a few minutes before getting out of bed. Consider elevating the bed at the head end to avoid drop of blood pressure when getting up. Walk always in a well-lit room (use night lights in the walls). Avoid area rugs or power cords from appliances in the middle of the walkways. Use a walker or a cane if necessary and consider physical therapy for balance exercise. Get your eyesight checked regularly.  FINANCIAL OVERSIGHT: Supervision, especially oversight when making financial decisions or transactions is also recommended.  HOME SAFETY: Consider the safety of  the kitchen when operating appliances like stoves, microwave oven, and blender. Consider having supervision and share cooking responsibilities until no longer able to participate in those. Accidents with firearms and other hazards in the house should be identified and addressed as well.   ABILITY TO BE LEFT ALONE: If patient is unable to  contact 911 operator, consider using LifeLine, or when the need is there, arrange for someone to stay with patients. Smoking is a fire hazard, consider supervision or cessation. Risk of wandering should be assessed by caregiver and if detected at any point, supervision and safe proof recommendations should be instituted.  MEDICATION SUPERVISION: Inability to self-administer medication needs to be constantly addressed. Implement a mechanism to ensure safe administration of the medications.   DRIVING: Regarding driving, in patients with progressive memory problems, driving will be impaired. We advise to have someone else do the driving if trouble finding directions or if minor accidents are reported. Independent driving assessment is available to determine safety of driving.   If you are interested in the driving assessment, you can contact the following:  [employer] in [place] [phone]  [facility] [phone]  [facility] [phone] or [phone]    Mediterranean Diet A Mediterranean diet refers to food and lifestyle choices that are based on the traditions of countries located on the [employer] [place]. This way of eating has been shown to help prevent certain conditions and improve outcomes for people who have chronic diseases, like kidney disease and heart disease. What are tips for following this plan? Lifestyle  Cook and eat meals together with your family, when possible. Drink enough fluid to keep your urine clear or pale yellow. Be physically active every day. This includes: Aerobic exercise like running or swimming. Leisure activities like gardening, walking, or housework. Get 7-8 hours of sleep each night. If recommended by your health care provider, drink red wine in moderation. This means 1 glass a day for nonpregnant women and 2 glasses a day for men. A glass of wine equals 5 oz (150  mL). Reading food labels  Check the serving size of packaged foods. For foods such as rice and pasta, the serving size refers to the amount of cooked product, not dry. Check the total fat in packaged foods. Avoid foods that have saturated fat or trans fats. Check the ingredients list for added sugars, such as corn syrup. Shopping  At the grocery store, buy most of your food from the areas near the walls of the store. This includes: Fresh fruits and vegetables (produce). Grains, beans, nuts, and seeds. Some of these may be available in unpackaged forms or large amounts (in bulk). Fresh seafood. Poultry and eggs. Low-fat dairy products. Buy whole ingredients instead of prepackaged foods. Buy fresh fruits and vegetables in-season from local farmers markets. Buy frozen fruits and vegetables in resealable bags. If you do not have access to quality fresh seafood, buy precooked frozen shrimp or canned fish, such as tuna, salmon, or sardines. Buy small amounts of raw or cooked vegetables, salads, or olives from the deli or salad bar at your store. Stock your pantry so you always have certain foods on hand, such as olive oil, canned tuna, canned tomatoes, rice, pasta, and beans. Cooking  Cook foods with extra-virgin olive oil instead of using butter or other vegetable oils. Have meat as a side dish, and have vegetables or grains as your main dish. This means having meat in small portions or  adding small amounts of meat to foods like pasta or stew. Use beans or vegetables instead of meat in common dishes like chili or lasagna. Experiment with different cooking methods. Try roasting or broiling vegetables instead of steaming or sauteing them. Add frozen vegetables to soups, stews, pasta, or rice. Add nuts or seeds for added healthy fat at each meal. You can add these to yogurt, salads, or vegetable dishes. Marinate fish or vegetables using olive oil, lemon juice, garlic, and fresh herbs. Meal  planning  Plan to eat 1 vegetarian meal one day each week. Try to work up to 2 vegetarian meals, if possible. Eat seafood 2 or more times a week. Have healthy snacks readily available, such as: Vegetable sticks with hummus. Greek yogurt. Fruit and nut trail mix. Eat balanced meals throughout the week. This includes: Fruit: 2-3 servings a day Vegetables: 4-5 servings a day Low-fat dairy: 2 servings a day Fish, poultry, or lean meat: 1 serving a day Beans and legumes: 2 or more servings a week Nuts and seeds: 1-2 servings a day Whole grains: 6-8 servings a day Extra-virgin olive oil: 3-4 servings a day Limit red meat and sweets to only a few servings a month What are my food choices? Mediterranean diet Recommended Grains: Whole-grain pasta. Brown rice. Bulgar wheat. Polenta. Couscous. Whole-wheat bread. Orpah Cobb. Vegetables: Artichokes. Beets. Broccoli. Cabbage. Carrots. Eggplant. Green beans. Chard. Kale. Spinach. Onions. Leeks. Peas. Squash. Tomatoes. Peppers. Radishes. Fruits: Apples. Apricots. Avocado. Berries. Bananas. Cherries. Dates. Figs. Grapes. Lemons. Melon. Oranges. Peaches. Plums. Pomegranate. Meats and other protein foods: Beans. Almonds. Sunflower seeds. Pine nuts. Peanuts. Cod. Salmon. Scallops. Shrimp. Tuna. Tilapia. Clams. Oysters. Eggs. Dairy: Low-fat milk. Cheese. Greek yogurt. Beverages: Water. Red wine. Herbal tea. Fats and oils: Extra virgin olive oil. Avocado oil. Grape seed oil. Sweets and desserts: [place] yogurt with honey. Baked apples. Poached pears. Trail mix. Seasoning and other foods: Basil. Cilantro. Coriander. Cumin. Mint. Parsley. Sage. Rosemary. Tarragon. Garlic. Oregano. Thyme. Pepper. Balsalmic vinegar. Tahini. Hummus. Tomato sauce. Olives. Mushrooms. Limit these Grains: Prepackaged pasta or rice dishes. Prepackaged cereal with added sugar. Vegetables: Deep fried potatoes (french fries). Fruits: Fruit canned in syrup. Meats and other protein  foods: Beef. Pork. Lamb. Poultry with skin. Hot dogs. Tomasa Blase. Dairy: Ice cream. Sour cream. Whole milk. Beverages: Juice. Sugar-sweetened soft drinks. Beer. Liquor and spirits. Fats and oils: Butter. Canola oil. Vegetable oil. Beef fat (tallow). Lard. Sweets and desserts: Cookies. Cakes. Pies. Candy. Seasoning and other foods: Mayonnaise. Premade sauces and marinades. The items listed may not be a complete list. Talk with your dietitian about what dietary choices are right for you. Summary The Mediterranean diet includes both food and lifestyle choices. Eat a variety of fresh fruits and vegetables, beans, nuts, seeds, and whole grains. Limit the amount of red meat and sweets that you eat. Talk with your health care provider about whether it is safe for you to drink red wine in moderation. This means 1 glass a day for nonpregnant women and 2 glasses a day for men. A glass of wine equals 5 oz (150 mL). This information is not intended to replace advice given to you by your health care provider. Make sure you discuss any questions you have with your health care provider. Document Released: 01/24/2016 Document Revised: 02/26/2016 Document Reviewed: 01/24/2016 Elsevier Interactive Patient Education  2017 [employer].    We have sent a referral to [facility] for your MRI and they will call you directly to schedule your appointment.  They are located at [address] [facility]. If you need to contact them directly please call [phone].  Your provider has requested that you have labwork completed today. Please go to [facility] ([id number]) on the second floor of this building before leaving the office today. You do not need to check in. If you are not called within 15 minutes please check with the front desk.

## 2022-10-16 NOTE — Progress Notes (Signed)
Assessment/Plan:    Mild cognitive impairment  Stephen Chavez is a very pleasant 89 y.o. 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 Patient is on memantine 5 mg nightly, tolerating well.  Personally reviewed of the brain 3/23 was remarkable for mild to moderate generalized cerebral atrophy and mild cerebellar atrophy, and mild chronic small vessel ischemic changes within the cerebral white matter.  MMSE today is 29/30.  Memory stable.  There is no indication to initiate any changes in his therapy.  He remains independent of his ADLs, he continues to drive locally.   Recommendations:   Follow up in 6  months. Continue memantine 5 mg nightly, side effects discussed  Monitor driving Continue to follow at the cancer center his iron deficiency anemia requiring Venofer Recommend good control of cardiovascular risk factors Continue to control mood as per PCP    Subjective:   This patient is accompanied in the office by his wife  who supplements the history. Previous records as well as any outside records available were reviewed prior to todays visit.   Patient was last seen on 03/28/2022.  MoCA on 02/24/2022 was 22/30.     Any changes in memory since last visit?  Memory is worse "coming and going".   He continues to have difficulties with word retrieval.  Patient denies difficulty remembering recent conversations and people or names.  Long-term memory is good.   repeats oneself?  Endorsed by wife Disoriented when walking into a room?  Patient denies  Leaving objects in unusual places?  Patient denies   Wandering behavior?   denies    Any personality changes since last visit?   denies   Any worsening depression?: denies   Hallucinations or paranoia?  denies   Seizures?   denies    Any sleep changes?  Sleeps well.  Denies vivid dreams, REM behavior or sleepwalking   Sleep apnea?   denies   Any hygiene concerns?  He has to be reminded to take a shower. Independent of bathing and dressing?  Endorsed  Does the patient needs help with medications?  Wife is in charge   Who is in charge of the finances?  Wife is in charge     Any changes in appetite?  Wife reports appetite is decreased. Takes Boost. Patient have trouble swallowing?  denies   Does the patient cook?  No.  Wife is in charge  Any headaches?    denies   Vision changes? denies.     Chronic back pain  denies   Ambulates with difficulty?    denies . Likes to walk with his son Recent falls or head injuries?    denies     Unilateral weakness, numbness or tingling? denies   Any tremors?  denies   Any anosmia?    denies.  He has decreased intake since 2018. Any incontinence of urine?  denies   Any bowel dysfunction?  He has a history of chronic constipation due to iron supplementation (Benefiber)      Patient lives  with his wife  Does the patient drive?  Only short distances and only during the day.  Initial Visit 02/24/22     How long did patient have memory difficulties?  Patient states that these memory issues may have started about 3 months ago, but wife reports they have been making it to 1 year, having slowed down since May.  Short-term memory is worse than long-term memory.  At times, he reports knowing the word, but cannot retrieve it.  He is to be more active, is increasing his activity, he notes feeling tired, does not want to participate in the events that priorly he used to enjoy.".  Patient lives with: Spouse  repeats oneself? endorsed Disoriented when walking into a room?  Patient denies .  Leaving objects in unusual places?  Patient denies    Ambulates  with difficulty?   Patient denies   Recent falls?  Patient denies   Any head injuries?  Patient denies   History of seizures?   Patient denies   Wandering behavior?  Patient denies   Patient drives?   Endorsed.  His wife reports that sometimes he gets confused with driving.  Since May he only.  Short distances  Any mood changes such irritability agitation?  Patient denies sometimes he gets " testy " Any history of depression?:  Endorsed.  Patient lost his son in February of this year, which has proxy depression to him.  Hallucinations?  Patient denies   Paranoia?  Patient denies   Patient reports that sleeps well without vivid dreams, REM behavior or sleepwalking    History of sleep apnea?  Denies  Any hygiene concerns?  Endorsed, sometimes she has to remind him to take a shower.   Independent of bathing and dressing?  Endorsed  Does the patient needs help with medications? Wife in charge  Who is in charge of the finances?  Wife is in charge   Any changes in appetite?  Patient denies   Patient have trouble swallowing? Patient denies   Does the patient cook?  Patient denies  . Wife does it  Any kitchen accidents such as leaving the stove on? Patient denies   Any headaches?  Patient denies   Double vision? Patient denies.  He has a history of cataracts right greater than left, with subsequent decrease in vision. Any focal numbness or tingling?  Patient denies   Chronic back pain Patient denies   Unilateral weakness?  Patient denies   Any tremors?  Patient denies   Any history of anosmia? Taste? Endorsed since 2018  Any incontinence of urine?  Patient denies   Any bowel dysfunction? Constipation due to Iron supplementation  History of heavy alcohol intake?  Patient denies   History of heavy tobacco use?  Patient denies   Family history of dementia? His mother had possibly Alzheimer's disease      03/18/22 MRI brain was remarkable for mild chronic small vessel ischemic  changes within the cerebral white  matter, slightly progressed from the prior MRI of 09/19/2016. Mild-to-moderate generalized cerebral atrophy and mild cerebellar atrophy.   Pertinent labs September 2023: Vitamin B12 439 TSH 1.57  Past Medical History:  Diagnosis Date   Amaurosis fugax of right eye 09/03/2016   CAD in native artery 07/05/2021   Carotid stenosis 09/03/2016   Mild 05/2016   Chronic combined systolic and diastolic heart failure (HCC) 10/23/2016   Essential hypertension 09/03/2016   Hyperlipidemia    Hypertension    Memory loss 02/18/2022     Past Surgical History:  Procedure Laterality Date   BIOPSY  04/01/2019   Procedure:  BIOPSY;  Surgeon: Kathi Der, MD;  Location[facility] ENDOSCOPY;  Service: Gastroenterology;;   COLONOSCOPY WITH PROPOFOL N/A 04/01/2019   Procedure: COLONOSCOPY WITH PROPOFOL;  Surgeon: Kathi Der, MD;  Location: [facility] ENDOSCOPY;  Service: Gastroenterology;  Laterality: N/A;   ENTEROSCOPY N/A 04/01/2019   Procedure: Push ENTEROSCOPY;  Surgeon: Kathi Der, MD;  Location: [facility] ENDOSCOPY;  Service: Gastroenterology;  Laterality: N/A;   EYE SURGERY     HOT HEMOSTASIS N/A 04/01/2019   Procedure: HOT HEMOSTASIS (ARGON PLASMA COAGULATION/BICAP);  Surgeon: Kathi Der, MD;  Location: [facility] ENDOSCOPY;  Service: Gastroenterology;  Laterality: N/A;   polyp removal     POLYPECTOMY  04/01/2019   Procedure: POLYPECTOMY;  Surgeon: Kathi Der, MD;  Location: [facility] ENDOSCOPY;  Service: Gastroenterology;;     PREVIOUS MEDICATIONS:   CURRENT MEDICATIONS:  Outpatient Encounter Medications as of 10/16/2022  Medication Sig   acetaminophen (TYLENOL) 160 MG/5ML suspension Take 240 mg by mouth every 6 (six) hours as needed for mild pain or headache.   amLODipine (NORVASC) 5 MG tablet Take 5 mg by mouth at bedtime.   cetirizine (ZYRTEC) 10 MG tablet Take 10 mg by mouth daily as needed for allergies or rhinitis.   febuxostat (ULORIC) 40 MG tablet Take 40 mg by  mouth daily as needed (for gout flares).   ferrous sulfate 325 (65 FE) MG tablet Take 1 tablet (325 mg total) by mouth daily with breakfast.   memantine (NAMENDA) 5 MG tablet Take 1 tablet (5 mg total) by mouth at bedtime.   METAMUCIL FIBER PO Take 12 g by mouth See admin instructions. Mix 12 grams of powder into 6-8 ounces of apple juice and drink once a day   mirtazapine (REMERON) 7.5 MG tablet Take 3.75-7.5 mg by mouth See admin instructions. Starting on 07/28/2021, take 3.75 mg by mouth at bedtime and increase to 7.5 mg after 1 week   MUCINEX FAST-MAX CONGEST COUGH 2.5-5-100 MG/5ML LIQD Take 5 mLs by mouth every 12 (twelve) hours as needed (for coughing).   Multiple Vitamins-Minerals (CENTRUM SILVER) CHEW Chew 1-2 tablets by mouth daily with breakfast.   pantoprazole (PROTONIX) 40 MG tablet Take 1 tablet (40 mg total) by mouth daily. (Patient taking differently: Take 40 mg by mouth daily before breakfast.)   rosuvastatin (CRESTOR) 40 MG tablet Take 1 tablet (40 mg total) by mouth at bedtime.   senna (SENOKOT) 8.6 MG TABS tablet Take 1 tablet by mouth daily.   No facility-administered encounter medications on file as of 10/16/2022.     Objective:     PHYSICAL EXAMINATION:    VITALS:   Vitals:   10/16/22 1503  Resp: 20  Height: 6' (1.829 m)    GEN:  The patient appears stated age and is in NAD. HEENT:  Normocephalic, atraumatic.   Neurological examination:  General: NAD, well-groomed, appears stated age. Orientation: The patient is alert. Oriented to person, place and date Cranial nerves: There is good facial symmetry. The speech is fluent and clear. No aphasia or dysarthria. Fund of knowledge is appropriate. Recent memory impaired and remote memory is normal.  Attention and concentration are normal.  Able to name objects and repeat phrases.  Hearing is intact to conversational tone.   Delayed recall 3/3 Sensation: Sensation is intact to light touch throughout Motor: Strength is  at least antigravity x4. Tremors: none  DTR's 2/4 in UE/LE      02/24/2022    2:00 PM  Montreal Cognitive Assessment   Visuospatial/ Executive (0/5) 4  Naming (  0/3) 2  Attention: Read list of digits (0/2) 2  Attention: Read list of letters (0/1) 1  Attention: Serial 7 subtraction starting at 100 (0/3) 1  Language: Repeat phrase (0/2) 2  Language : Fluency (0/1) 0  Abstraction (0/2) 2  Delayed Recall (0/5) 2  Orientation (0/6) 6  Total 22  Adjusted Score (based on education) 22       10/16/2022    3:00 PM  MMSE - Mini Mental State Exam  Orientation to time 4  Orientation to Place 5  Registration 3  Attention/ Calculation 5  Recall 3  Language- name 2 objects 2  Language- repeat 1  Language- follow 3 step command 3  Language- read & follow direction 1  Write a sentence 1  Copy design 1  Total score 29       Movement examination: Tone: There is normal tone in the UE/LE Abnormal movements:  no tremor.  No myoclonus.  No asterixis.   Coordination:  There is no decremation with RAM's. Normal finger to nose  Gait and Station: The patient has no difficulty arising out of a deep-seated chair without the use of the hands. The patient's stride length is good.  Gait is cautious and narrow.   Thank you for allowing [place] the opportunity to participate in the care of this nice patient. Please do not hesitate to contact us for any questions or concerns.   Total time spent on today's visit was 20 minutes dedicated to this patient today, preparing to see patient, examining the patient, ordering tests and/or medications and counseling the patient, documenting clinical information in the EHR or other health record, independently interpreting results and communicating results to the patient/family, discussing treatment and goals, answering patient's questions and coordinating care.  Cc:  [name], NP  Marlowe Kays 10/16/2022 3:16 PM

## 2022-10-28 ENCOUNTER — Encounter: Payer: Self-pay | Admitting: Gastroenterology

## 2022-11-03 ENCOUNTER — Other Ambulatory Visit (HOSPITAL_BASED_OUTPATIENT_CLINIC_OR_DEPARTMENT_OTHER): Payer: Self-pay | Admitting: Cardiovascular Disease

## 2022-11-04 NOTE — Telephone Encounter (Signed)
11/04/22 LVM to schedule overdue f/u - LCN  

## 2022-11-06 NOTE — Telephone Encounter (Signed)
Left message for patient to call and schedule the over due follow up appointment for medication refills

## 2022-11-12 NOTE — Telephone Encounter (Signed)
Left message for patient to call and schedule over due follow up for medication refills 

## 2022-11-14 NOTE — Telephone Encounter (Signed)
Scheduled 11/25/22 with Caitlin Walker, NP 

## 2022-11-21 ENCOUNTER — Encounter: Payer: Self-pay | Admitting: Internal Medicine

## 2022-11-21 ENCOUNTER — Ambulatory Visit
Admission: RE | Admit: 2022-11-21 | Discharge: 2022-11-21 | Disposition: A | Discharge status: Home / Self Care | Payer: Medicare HMO | Source: Ambulatory Visit | Attending: Gastroenterology | Admitting: Gastroenterology

## 2022-11-21 DIAGNOSIS — K8689 Other specified diseases of pancreas: Secondary | ICD-10-CM

## 2022-11-21 MED ORDER — GADOPICLENOL 0.5 MMOL/ML IV SOLN
7.0000 mL | Freq: Once | INTRAVENOUS | Status: AC | PRN
  Administered 2022-11-21: 7 mL via INTRAVENOUS

## 2022-11-25 ENCOUNTER — Ambulatory Visit (HOSPITAL_BASED_OUTPATIENT_CLINIC_OR_DEPARTMENT_OTHER): Payer: Medicare HMO | Admitting: Family

## 2022-11-25 ENCOUNTER — Encounter (HOSPITAL_BASED_OUTPATIENT_CLINIC_OR_DEPARTMENT_OTHER): Payer: Self-pay | Admitting: Family

## 2022-11-25 VITALS — BP 108/60 | HR 65 | Ht 72.0 in | Wt 155.0 lb

## 2022-11-25 DIAGNOSIS — N183 Chronic kidney disease, stage 3 unspecified: Secondary | ICD-10-CM

## 2022-11-25 DIAGNOSIS — E785 Hyperlipidemia, unspecified: Secondary | ICD-10-CM | POA: Diagnosis not present

## 2022-11-25 DIAGNOSIS — I2510 Atherosclerotic heart disease of native coronary artery without angina pectoris: Secondary | ICD-10-CM

## 2022-11-25 DIAGNOSIS — R413 Other amnesia: Secondary | ICD-10-CM

## 2022-11-25 DIAGNOSIS — D500 Iron deficiency anemia secondary to blood loss (chronic): Secondary | ICD-10-CM

## 2022-11-25 DIAGNOSIS — I10 Essential (primary) hypertension: Secondary | ICD-10-CM

## 2022-11-25 DIAGNOSIS — I5032 Chronic diastolic (congestive) heart failure: Secondary | ICD-10-CM

## 2022-11-25 DIAGNOSIS — I25118 Atherosclerotic heart disease of native coronary artery with other forms of angina pectoris: Secondary | ICD-10-CM

## 2022-11-25 DIAGNOSIS — I6523 Occlusion and stenosis of bilateral carotid arteries: Secondary | ICD-10-CM

## 2022-11-25 DIAGNOSIS — G3184 Mild cognitive impairment, so stated: Secondary | ICD-10-CM

## 2022-11-25 NOTE — Assessment & Plan Note (Signed)
Follows with hematology

## 2022-11-25 NOTE — Assessment & Plan Note (Signed)
Euvolemic on exam.  Not requiring diuretic therapy.Low sodium diet, fluid restriction <2L, and daily weights encouraged. Educated to contact our office for weight gain of 2 lbs overnight or 5 lbs in one week.

## 2022-11-25 NOTE — Patient Instructions (Signed)
Medication Instructions:  Your physician has recommended you make the following change in your medication:   Stop: Amlodipine   *If you need a refill on your cardiac medications before your next appointment, please call your pharmacy*  Follow-Up: At [facility], you and your health needs are our priority.  As part of our continuing mission to provide you with exceptional heart care, we have created designated Provider Care Teams.  These Care Teams include your primary Cardiologist (physician) and Advanced Practice Providers (APPs -  Physician Assistants and Nurse Practitioners) who all work together to provide you with the care you need, when you need it.  We recommend signing up for the patient portal called "MyChart".  Sign up information is provided on this After Visit Summary.  MyChart is used to connect with patients for Virtual Visits (Telemedicine).  Patients are able to view lab/test results, encounter notes, upcoming appointments, etc.  Non-urgent messages can be sent to your provider as well.   To learn more about what you can do with MyChart, go to [email or web address].    Your next appointment:   6 months with Dr. Duke Salvia or [name], NP   Other Instructions We will call you in two weeks to check on BP

## 2022-11-25 NOTE — Assessment & Plan Note (Signed)
Stable with no anginal symptoms. No indication for ischemic evaluation.  GDMT Rosuvastatin 40mg  QD. Heart healthy diet and regular cardiovascular exercise encouraged.

## 2022-11-25 NOTE — Assessment & Plan Note (Signed)
Mild by CT 2017.  Continue rosuvastatin 40 mg daily.  No amaurosis fugax nor lightheadedness suggestive of progression.  Repeat duplex only as clinically indicated.

## 2022-11-25 NOTE — Assessment & Plan Note (Signed)
Relatively hypotensive today 108/60.  Ongoing reported fatigue.  Will discontinue amlodipine. Discussed to monitor BP at home at least 2 hours after medications and sitting for 5-10 minutes.  Phone call in 2 weeks to ensure BP still at goal less than 130/80.

## 2022-11-25 NOTE — Progress Notes (Signed)
Cardiology Office Note:  .   Date:  11/25/2022  ID:  Stephen Chavez, DOB [birth year], MRN [id number] PCP: Stephen Back, NP  [facility] Providers Cardiologist:  Chilton Si, MD    History of Present Illness: .   Stephen Chavez is a 89 y.o. male hx of  chronic systolic and diastolic heart failure, mild carotid stenosis, CKD 3, amaurosis fugax, chronic anemia secondary to GI AVM and IDA, hypertension last seen 02/18/2022 by Dr. Duke Chavez.   Prior transient monocular visual loss right eye 05/2016 which lasted several minutes which has recurred 3-4 times.  No pathology noted by ophthalmologist.  Felt to be due to amaurosis fugax and TIA evaluation recommended.  Carotid Doppler revealed mild carotid stenosis bilaterally.  Aspirin increased to 325 mg daily.  Referred to cardiology for evaluation with echo bubble 2017 EF 45-50% with diffuse hypokinesis and grade 1 diastolic dysfunction, moderate TR, negative for atrial shunting.  3-day monitor with no significant arrhythmias.  BP poorly controlled amlodipine increased.  He was subsequently lost to follow-up until 07/05/2021.  He did have echocardiogram in the interim 06/2018 with EF 60 to 65%, grade 1 diastolic dysfunction, PASP 40 mmHg.   He had bilateral PE 06/2018.  He was noted calcification of the LAD on CT chest at that time.  Last seen in clinic 07/05/2021.  Repeat echocardiogram ordered to assess shortness of breath and lower extremity edema. It showed normal LVEF 55-60%, severe asymmetric LVH, no RWMA, gr1DD, trivial MR/AI. Myoview ordered to assess for ischemia which was low risk.    Admitted 2/14 - 08/01/2021 with generalized weakness.  Appear to be primarily related to dehydration, poor dietary intake, likely clinical depression related to loss of son a few months prior.  He did have AKI.  VQ scan and venous duplex without evidence of thrombosis despite elevated D-dimer.  He was provided IV iron in hospital stay due to chronic iron deficiency  anemia.  Last seen on/5/23 noting persistent runny nose despite Zyrtec.  He also noted fatigue despite normal thyroid function 07/2021 and receiving 3 iron infusions.  He was referred to neurology due to episodes of confusion.  Subsequently started on memantine 5 mg nightly.   Presents today for follow up with his wife. Planning a bus trip in August to [place] with family. Reports "medium" energy level though wife notes it has been low. Wife notes he has been more sedentary and sleeping long times. Often does not go to bed until 3 AM.  His wife is concerned regarding p.o. intake and weight loss (has lost nearly 20 pounds over the last 9 months) with poor appetite.  Drinking boost once per day-encouraged to increase to twice per day any regular meals.  No chest pain, dyspnea, lightheadedness.  PCP ordered and performed ZIO monitor but he is unclear why it was ordered-will request records.  ROS: Please see the history of present illness.    All other systems reviewed and are negative.   Studies Reviewed: Marland Kitchen    EKG:  EKG is ordered & performed today demonstrating NSR 65 bpm with LAFB and no acute ST/T wave changes.   Risk Assessment/Calculations:             Physical Exam:   VS:  BP 108/60   Pulse 65   Ht 6' (1.829 m)   Wt 155 lb (70.3 kg)   BMI 21.02 kg/m    Wt Readings from Last 3 Encounters:  11/25/22 155 lb (70.3 kg)  10/16/22 159 lb (72.1 kg)  07/21/22 161 lb 6.4 oz (73.2 kg)    GEN: Well nourished, well developed in no acute distress NECK: No JVD; No carotid bruits CARDIAC: RRR, no murmurs, rubs, gallops RESPIRATORY:  Clear to auscultation without rales, wheezing or rhonchi  ABDOMEN: Soft, non-tender, non-distended EXTREMITIES:  No edema; No deformity   ASSESSMENT AND PLAN: .   Carotid stenosis Mild by CT 2017.  Continue rosuvastatin 40 mg daily.  No amaurosis fugax nor lightheadedness suggestive of progression.  Repeat duplex only as clinically indicated.  Chronic diastolic CHF  (congestive heart failure) (HCC) Euvolemic on exam.  Not requiring diuretic therapy.Low sodium diet, fluid restriction <2L, and daily weights encouraged. Educated to contact our office for weight gain of 2 lbs overnight or 5 lbs in one week.   CAD in native artery Stable with no anginal symptoms. No indication for ischemic evaluation.  GDMT Rosuvastatin 40mg  QD. Heart healthy diet and regular cardiovascular exercise encouraged.    CKD (chronic kidney disease), stage III Follows with nephrology.  Memory loss On Namenda per neurology.  Iron deficiency anemia due to chronic blood loss Follows with hematology.  Mild cognitive impairment On [place] per neurology.  Wife reports they were also told this should help with appetite-encouraged to discuss decreased appetite, weight loss with primary care provider.  Encouraged to utilize Boost twice per day.  Essential hypertension Relatively hypotensive today 108/60.  Ongoing reported fatigue.  Will discontinue amlodipine. Discussed to monitor BP at home at least 2 hours after medications and sitting for 5-10 minutes.  Phone call in 2 weeks to ensure BP still at goal less than 130/80.        Dispo: 6 mos with Dr. Duke Chavez or APP  Signed, Stephen Sorrow, NP

## 2022-11-25 NOTE — Assessment & Plan Note (Signed)
On Namenda per neurology.

## 2022-11-25 NOTE — Assessment & Plan Note (Signed)
Follows with nephrology.

## 2022-11-25 NOTE — Assessment & Plan Note (Signed)
On Namenda per neurology.  Wife reports they were also told this should help with appetite-encouraged to discuss decreased appetite, weight loss with primary care provider.  Encouraged to utilize Boost twice per day.

## 2022-11-26 ENCOUNTER — Other Ambulatory Visit: Payer: Self-pay

## 2022-11-26 MED ORDER — MEMANTINE HCL 5 MG PO TABS: 5.0000 mg | ORAL_TABLET | Freq: Every evening | ORAL | 6 refills | Status: DC

## 2022-12-09 ENCOUNTER — Encounter (HOSPITAL_BASED_OUTPATIENT_CLINIC_OR_DEPARTMENT_OTHER): Payer: Self-pay

## 2022-12-25 ENCOUNTER — Inpatient Hospital Stay (HOSPITAL_BASED_OUTPATIENT_CLINIC_OR_DEPARTMENT_OTHER): Payer: Medicare HMO | Admitting: Internal Medicine

## 2022-12-25 ENCOUNTER — Inpatient Hospital Stay: Payer: Medicare HMO | Attending: Internal Medicine

## 2022-12-25 VITALS — BP 118/62 | HR 73 | Temp 98.1°F | Resp 18 | Ht 72.0 in | Wt 160.5 lb

## 2022-12-25 DIAGNOSIS — Z79899 Other long term (current) drug therapy: Secondary | ICD-10-CM | POA: Diagnosis not present

## 2022-12-25 DIAGNOSIS — K5520 Angiodysplasia of colon without hemorrhage: Secondary | ICD-10-CM | POA: Diagnosis present

## 2022-12-25 DIAGNOSIS — D500 Iron deficiency anemia secondary to blood loss (chronic): Secondary | ICD-10-CM | POA: Diagnosis present

## 2022-12-25 DIAGNOSIS — D508 Other iron deficiency anemias: Secondary | ICD-10-CM

## 2022-12-25 LAB — CBC WITH DIFFERENTIAL (CANCER CENTER ONLY)
Abs Immature Granulocytes: 0.02 10*3/uL (ref 0.00–0.07)
Basophils Absolute: 0 10*3/uL (ref 0.0–0.1)
Basophils Relative: 0 %
Eosinophils Absolute: 0.4 10*3/uL (ref 0.0–0.5)
Eosinophils Relative: 7 %
HCT: 29 % — ABNORMAL LOW (ref 39.0–52.0)
Hemoglobin: 9.7 g/dL — ABNORMAL LOW (ref 13.0–17.0)
Immature Granulocytes: 0 %
Lymphocytes Relative: 34 %
Lymphs Abs: 2.1 10*3/uL (ref 0.7–4.0)
MCH: 28.9 pg (ref 26.0–34.0)
MCHC: 33.4 g/dL (ref 30.0–36.0)
MCV: 86.3 fL (ref 80.0–100.0)
Monocytes Absolute: 0.4 10*3/uL (ref 0.1–1.0)
Monocytes Relative: 6 %
Neutro Abs: 3.3 10*3/uL (ref 1.7–7.7)
Neutrophils Relative %: 53 %
Platelet Count: 180 10*3/uL (ref 150–400)
RBC: 3.36 MIL/uL — ABNORMAL LOW (ref 4.22–5.81)
RDW: 15.3 % (ref 11.5–15.5)
WBC Count: 6.3 10*3/uL (ref 4.0–10.5)
nRBC: 0 % (ref 0.0–0.2)

## 2022-12-25 LAB — IRON AND IRON BINDING CAPACITY (CC-WL,HP ONLY)
Iron: 62 ug/dL (ref 45–182)
Saturation Ratios: 23 % (ref 17.9–39.5)
TIBC: 272 ug/dL (ref 250–450)
UIBC: 210 ug/dL (ref 117–376)

## 2022-12-25 LAB — FERRITIN: Ferritin: 138 ng/mL (ref 24–336)

## 2022-12-25 NOTE — Progress Notes (Signed)
[facility] Telephone:(336) [phone]   [phone]  OFFICE PROGRESS NOTE  [name], NP [address] 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  PRIOR THERAPY: Iron infusion with Ferrlecit weekly for 4 weeks started April 15, 2019  CURRENT THERAPY: Ferrous sulfate 325 mg p.o. daily.  INTERVAL HISTORY: Stephen Chavez 89 y.o. male returns to the clinic today for follow-up visit accompanied by his wife.  The patient is feeling fine today with no concerning complaints except for mild fatigue and arthralgia.  He is still very active at home.  He denied having any chest pain, shortness of breath, cough or hemoptysis.  He has no nausea, vomiting, diarrhea or constipation.  He has no bleeding, bruises or ecchymosis.  He is here today for evaluation and repeat blood work.     MEDICAL HISTORY: Past Medical History:  Diagnosis Date   Amaurosis fugax of right eye 09/03/2016   CAD in native artery 07/05/2021   Carotid stenosis 09/03/2016   Mild 05/2016   Chronic combined systolic and diastolic heart failure (HCC) 10/23/2016   Essential hypertension 09/03/2016   Hyperlipidemia    Hypertension    Memory loss 02/18/2022    ALLERGIES:  is allergic to allopurinol and colchicine.  MEDICATIONS:  Current Outpatient Medications  Medication Sig Dispense Refill   acetaminophen (TYLENOL) 160 MG/5ML suspension Take 240 mg by mouth every 6 (six) hours as needed for mild pain or headache.     cetirizine (ZYRTEC) 10 MG tablet Take 10 mg by mouth daily as needed for allergies or rhinitis.     ferrous sulfate 325 (65 FE) MG tablet Take 1 tablet (325 mg total) by mouth daily with breakfast.  3   memantine (NAMENDA) 5 MG tablet Take 1 tablet (5 mg total) by mouth at bedtime. 30 tablet 6   METAMUCIL FIBER PO Take 12 g by mouth See admin instructions. Mix 12 grams of powder into 6-8  ounces of apple juice and drink once a day     mirtazapine (REMERON) 7.5 MG tablet Take 3.75-7.5 mg by mouth See admin instructions. Starting on 07/28/2021, take 3.75 mg by mouth at bedtime and increase to 7.5 mg after 1 week     MUCINEX FAST-MAX CONGEST COUGH 2.5-5-100 MG/5ML LIQD Take 5 mLs by mouth every 12 (twelve) hours as needed (for coughing).     Multiple Vitamins-Minerals (CENTRUM SILVER) CHEW Chew 1-2 tablets by mouth daily with breakfast.     rosuvastatin (CRESTOR) 40 MG tablet TAKE 1 Tablet BY MOUTH ONCE DAILY 30 tablet 0   senna (SENOKOT) 8.6 MG TABS tablet Take 1 tablet by mouth daily.     No current facility-administered medications for this visit.    SURGICAL HISTORY:  Past Surgical History:  Procedure Laterality Date   BIOPSY  04/01/2019   Procedure: BIOPSY;  Surgeon: Kathi Der, MD;  Location: [facility];  Service: Gastroenterology;;   COLONOSCOPY WITH PROPOFOL N/A 04/01/2019   Procedure: COLONOSCOPY WITH PROPOFOL;  Surgeon: Kathi Der, MD;  Location: [facility];  Service: Gastroenterology;  Laterality: N/A;   ENTEROSCOPY N/A 04/01/2019   Procedure: Push ENTEROSCOPY;  Surgeon: Kathi Der, MD;  Location: [facility];  Service: Gastroenterology;  Laterality: N/A;   EYE SURGERY     HOT HEMOSTASIS N/A 04/01/2019   Procedure: HOT HEMOSTASIS (ARGON PLASMA COAGULATION/BICAP);  Surgeon: Kathi Der, MD;  Location: [facility] ENDOSCOPY;  Service: Gastroenterology;  Laterality: N/A;   polyp removal     POLYPECTOMY  04/01/2019   Procedure: POLYPECTOMY;  Surgeon: Kathi Der, MD;  Location: [facility]OS[facility];  Service: Gastroenterology;;    REVIEW OF SYSTEMS:  A comprehensive review of systems was negative except for: Constitutional: positive for fatigue   PHYSICAL EXAMINATION: General appearance: alert, cooperative, fatigued, and no distress Head: Normocephalic, without obvious abnormality, atraumatic Neck: no adenopathy, no JVD, supple, symmetrical,  trachea midline, and thyroid not enlarged, symmetric, no tenderness/mass/nodules Lymph nodes: Cervical, supraclavicular, and axillary nodes normal. Resp: clear to auscultation bilaterally Back: symmetric, no curvature. ROM normal. No CVA tenderness. Cardio: regular rate and rhythm, S1, S2 normal, no murmur, click, rub or gallop GI: soft, non-tender; bowel sounds normal; no masses,  no organomegaly Extremities: extremities normal, atraumatic, no cyanosis or edema  ECOG PERFORMANCE STATUS: 1 - Symptomatic but completely ambulatory  Blood pressure 118/62, pulse 73, temperature 98.1 F (36.7 C), temperature source Oral, resp. rate 18, height 6' (1.829 m), weight 160 lb 8 oz (72.8 kg), SpO2 100%.  LABORATORY DATA: Lab Results  Component Value Date   WBC 6.3 12/25/2022   HGB 9.7 (L) 12/25/2022   HCT 29.0 (L) 12/25/2022   MCV 86.3 12/25/2022   PLT 180 12/25/2022      Chemistry      Component Value Date/Time   NA 138 08/01/2021 0220   K 4.6 08/01/2021 0220   CL 104 08/01/2021 0220   CO2 23 08/01/2021 0220   BUN 18 08/01/2021 0220   CREATININE 2.05 (H) 08/01/2021 0220   CREATININE 1.94 (H) 12/22/2019 1122      Component Value Date/Time   CALCIUM 9.5 08/01/2021 0220   ALKPHOS 59 08/01/2021 0220   AST 17 08/01/2021 0220   AST 13 (L) 12/22/2019 1122   ALT 11 08/01/2021 0220   ALT 17 12/22/2019 1122   BILITOT 0.6 08/01/2021 0220   BILITOT 0.4 12/22/2019 1122       RADIOGRAPHIC STUDIES: No results found.  ASSESSMENT AND PLAN: This is a very pleasant 89 years old African-American male with history of anemia of chronic disease plus iron deficiency anemia secondary to gastrointestinal blood loss from AV malformation. The patient was treated with intravenous iron infusion with Ferrlecit weekly for 4 weeks.  He is currently on oral ferrous sulfate and tolerating it fairly well. Repeat CBC showed persistent anemia with hemoglobin of 9.7 and hematocrit 29.0%. His iron study and  ferritin are normal. I recommended for the patient to continue on the oral iron tablets for now. I will see him back for follow-up visit in 6 months for evaluation and repeat blood work. The patient was advised to call immediately if he has any other concerning symptoms in the interval. The patient voices understanding of current disease status and treatment options and is in agreement with the current care plan. All questions were answered. The patient knows to call the clinic with any problems, questions or concerns. We can certainly see the patient much sooner if necessary.  Disclaimer: This note was dictated with voice recognition software. Similar sounding words can inadvertently be transcribed and may not be corrected upon review.

## 2022-12-26 ENCOUNTER — Encounter: Payer: Self-pay | Admitting: Internal Medicine

## 2023-02-05 ENCOUNTER — Other Ambulatory Visit (HOSPITAL_BASED_OUTPATIENT_CLINIC_OR_DEPARTMENT_OTHER): Payer: Self-pay | Admitting: Cardiovascular Disease

## 2023-04-17 ENCOUNTER — Ambulatory Visit: Payer: Medicare HMO | Admitting: Physician Assistant

## 2023-04-17 ENCOUNTER — Encounter: Payer: Self-pay | Admitting: Physician Assistant

## 2023-04-17 DIAGNOSIS — Z029 Encounter for administrative examinations, unspecified: Secondary | ICD-10-CM

## 2023-04-22 ENCOUNTER — Ambulatory Visit: Payer: Medicare HMO | Admitting: Physician Assistant

## 2023-05-25 ENCOUNTER — Ambulatory Visit (HOSPITAL_BASED_OUTPATIENT_CLINIC_OR_DEPARTMENT_OTHER): Payer: Medicare HMO | Admitting: Cardiovascular Disease

## 2023-05-25 ENCOUNTER — Encounter (HOSPITAL_BASED_OUTPATIENT_CLINIC_OR_DEPARTMENT_OTHER): Payer: Self-pay | Admitting: Cardiovascular Disease

## 2023-05-25 VITALS — BP 132/70 | HR 74 | Ht 72.0 in | Wt 165.9 lb

## 2023-05-25 DIAGNOSIS — I10 Essential (primary) hypertension: Secondary | ICD-10-CM

## 2023-05-25 DIAGNOSIS — E785 Hyperlipidemia, unspecified: Secondary | ICD-10-CM

## 2023-05-25 DIAGNOSIS — Z5181 Encounter for therapeutic drug level monitoring: Secondary | ICD-10-CM | POA: Diagnosis not present

## 2023-05-25 NOTE — Progress Notes (Unsigned)
Cardiology Office Note:  .   Date:  05/25/2023  ID:  AKHEEM SCHMITZER, DOB 03/29/1933, MRN [id number] PCP: [name], NP  [facility] Providers Cardiologist:  Chilton Si, MD { Click to update primary MD,subspecialty MD or APP then REFRESH:1}   History of Present Illness: .   Stephen Chavez is a 89 y.o. 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  Stephen Chavez had transient monocular visual loss in his right eye 05/2016. The episode lasted several minutes. Since then it is have been 3 or 4 times in the same eye. He has been seen by an ophthalmologist and no pathology was noted.  His symptoms were felt to be due to amarourosis fugax and TIA evaluation was recommended.  He had carotid Dopplers that revealed mild carotid stenosis bilaterally.  Aspirin was increased from 81 mg 325 mg daily. He followed up with his PCP, Dr. [name], and was referred to cardiology for further evaluation.  He was seen in 2017 and had an echo with bubble that revealed LVEF 45 to 50% with diffuse hypokinesis and grade 1 diastolic dysfunction.  There is moderate tricuspid regurgitation.  It was negative for atrial shunting.  He wore a 30-day monitor that did not reveal any significant arrhythmias.  His blood pressure was poorly controlled so amlodipine was increased.  He has not been seen by cardiology since that time.  He had an echo 06/2018 which revealed LVEF 60 to 65% with grade 1 diastolic dysfunction.  PASP was 40 mmHg.   Stephen Chavez has iron deficiency anemia in the setting of GI AVMs.  He gets iron infusions with Dr. Gwenyth Bouillon.  He had bilateral PE 06/2018.  He was noted to have calcification of the LAD on chest CT at that time.  At his last appointment we recommended working on increasing his exercise.  He was referred for a Lexiscan Myoview 07/2021 that revealed LVEF 59%  and no ischemia. There was diaphragmatic attenuation.  He was admitted 07/2021 with generalized weakness thought to be due to intravascular volume depletion.  He was found to be very depressed after his son's death.  VQ scan was negative for pulmonary embolism.  He was given IV iron for iron deficiency anemia.  He followed with [name], NP on 10/2021 and reported swelling in his right hand but is otherwise well.   Stephen Chavez, reports a persistent runny nose while eating for the past two to three months. He takes Zyrtec daily for allergies. He denies any abdominal pain. The patient experiences general fatigue and stays in bed until late morning, but denies any chest pain or shortness of breath. He had a normal thyroid function in February and received three iron infusions without significant improvement in energy levels.   At his visit 02/2022 he was doing well but his wife noted memory loss.  He followed up with [name], NP 11/2022 and noted lack of energy and poor sleep.  History of Present Illness            ROS:  As per HPI  Studies Reviewed: .        *** Risk Assessment/Calculations:   {Does this patient have ATRIAL FIBRILLATION?[phone]}         Physical Exam:   VS:  BP 132/70 (BP Location: Left Arm, Patient Position: Sitting, Cuff Size: Normal)   Pulse  74   Ht 6' (1.829 m)   Wt 165 lb 14.4 oz (75.3 kg)   SpO2 96%   BMI 22.50 kg/m  , BMI Body mass index is 22.5 kg/m. GENERAL:  Well appearing HEENT: Pupils equal round and reactive, fundi not visualized, oral mucosa unremarkable NECK:  No jugular venous distention, waveform within normal limits, carotid upstroke brisk and symmetric, no bruits, no thyromegaly LYMPHATICS:  No cervical adenopathy LUNGS:  Clear to auscultation bilaterally HEART:  RRR.  PMI not displaced or sustained,S1 and S2 within normal limits, no S3, no S4, no clicks, no rubs, *** murmurs ABD:  Flat, positive bowel sounds normal in frequency in  pitch, no bruits, no rebound, no guarding, no midline pulsatile mass, no hepatomegaly, no splenomegaly EXT:  2 plus pulses throughout, no edema, no cyanosis no clubbing SKIN:  No rashes no nodules NEURO:  Cranial nerves II through XII grossly intact, motor grossly intact throughout PSYCH:  Cognitively intact, oriented to person place and time   ASSESSMENT AND PLAN: .   *** Assessment and Plan                 {Are you ordering a CV Procedure (e.g. stress test, cath, DCCV, TEE, etc)?   Press F2        :[phone]}  Dispo: ***  Signed, Chilton Si, MD

## 2023-05-25 NOTE — Patient Instructions (Signed)
Medication Instructions:  Your physician recommends that you continue on your current medications as directed. Please refer to the Current Medication list given to you today.   *If you need a refill on your cardiac medications before your next appointment, please call your pharmacy*  Lab Work: FASTING LP'/CMET SOON   If you have labs (blood work) drawn today and your tests are completely normal, you will receive your results only by: MyChart Message (if you have MyChart) OR A paper copy in the mail If you have any lab test that is abnormal or we need to change your treatment, we will call you to review the results.  Testing/Procedures: NONE  Follow-Up: At [facility], you and your health needs are our priority.  As part of our continuing mission to provide you with exceptional heart care, we have created designated Provider Care Teams.  These Care Teams include your primary Cardiologist (physician) and Advanced Practice Providers (APPs -  Physician Assistants and Nurse Practitioners) who all work together to provide you with the care you need, when you need it.  We recommend signing up for the patient portal called "MyChart".  Sign up information is provided on this After Visit Summary.  MyChart is used to connect with patients for Virtual Visits (Telemedicine).  Patients are able to view lab/test results, encounter notes, upcoming appointments, etc.  Non-urgent messages can be sent to your provider as well.   To learn more about what you can do with MyChart, go to [email or web address].    Your next appointment:   6 month(s)  Provider:   [name], NP   1 YEAR WITH DR [facility]

## 2023-06-09 ENCOUNTER — Encounter: Payer: Self-pay | Admitting: Physician Assistant

## 2023-06-09 ENCOUNTER — Ambulatory Visit: Payer: Medicare HMO | Admitting: Physician Assistant

## 2023-06-09 VITALS — BP 133/74 | HR 74 | Resp 18 | Wt 166.0 lb

## 2023-06-09 DIAGNOSIS — G3184 Mild cognitive impairment, so stated: Secondary | ICD-10-CM | POA: Diagnosis not present

## 2023-06-09 NOTE — Progress Notes (Signed)
Assessment/Plan:   Memory Impairment   Stephen Chavez is a very pleasant 89 y.o. 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 Patient is on memantine 5 mg nightly, tolerating well.  Mild cognitive decline is noted. He is able to participate on his ADLs use to drive locally with wife as copilot.     Recommendations:   Follow up in 6 months. Increase Memantine  to 5 mg twice a day , side effects discussed Recommend good control of cardiovascular risk factors Continue to control mood as per PCP Monitor driving    Subjective:   This patient is accompanied in the office by his wife  who supplements the history. Previous records as well as any outside records available were reviewed prior to todays visit. Patient was last seen on 10/16/2022 with MMSE 29/30.     Any changes in memory since last visit? "About the same".  He is to have difficulties with word retrieval.  He denies any issues remembering conversations or names of people.  Long-term memory is good  repeats oneself?  Endorsed by his wife. Disoriented when walking into a room?  Patient denies    Misplacing objects?  Patient denies   Wandering behavior?   denies   Any personality changes since last visit?   Denies.   Any worsening depression?: denies.   Hallucinations or paranoia?  Denies.   Seizures?   denies    Any sleep changes? Sleeps well. Denies vivid dreams, REM behavior or sleepwalking   Sleep apnea?   denies    Any hygiene concerns?  Needs to be reminded to shower, wife reports. Independent of bathing and dressing?  Endorsed  Does the patient needs help with  medications?  Wife is in charge   Who is in charge of the finances?  Wife is in charge     Any changes in appetite?  Wife reports that his appetite is decreased, he takes Boost to supplements.  Patient have trouble swallowing?  Denies.   Does the patient cook?  Wife prepares the meals. Any headaches?    denies   Vision changes? denies Chronic pain?  denies   Ambulates with difficulty?  He denies any issues.  He likes to walk with his son on a regular basis, but has not been doing that very often.    Recent falls or head injuries?    denies      Unilateral weakness, numbness or tingling?   denies   Any tremors?  denies   Any anosmia?   2019 decreased sense of smell.   Any incontinence of urine?  denies   Any bowel dysfunction?  Is a history of chronic constipation due to iron supplements, he takes Benefiber. Patient lives with his wife     Does the patient drive?  He drives only short distances during the day, without any issues, denies getting lost. "He may forget where to turn sometimes"    Initial visit How long did patient have memory difficulties?  Patient states that these memory issues may have started about 3 months ago, but wife reports they have been making it to 1 year, having slowed down since  May.  Short-term memory is worse than long-term memory.  At times, he reports knowing the word, but cannot retrieve it.  He is to be more active, is increasing his activity, he notes feeling tired, does not want to participate in the events that priorly he used to enjoy.".  Patient lives with: Spouse  repeats oneself? endorsed Disoriented when walking into a room?  Patient denies .  Leaving objects in unusual places?  Patient denies   Ambulates  with difficulty?   Patient denies   Recent falls?  Patient denies   Any head injuries?  Patient denies   History of seizures?   Patient denies   Wandering behavior?  Patient denies   Patient drives?   Endorsed.  His wife reports that sometimes  he gets confused with driving.  Since May he only.  Short distances  Any mood changes such irritability agitation?  Patient denies sometimes he gets " testy " Any history of depression?:  Endorsed.  Patient lost his son in February of this year, which has proxy depression to him.  Hallucinations?  Patient denies   Paranoia?  Patient denies   Patient reports that sleeps well without vivid dreams, REM behavior or sleepwalking    History of sleep apnea?  Denies  Any hygiene concerns?  Endorsed, sometimes she has to remind him to take a shower.   Independent of bathing and dressing?  Endorsed  Does the patient needs help with medications? Wife in charge  Who is in charge of the finances?  Wife is in charge   Any changes in appetite?  Patient denies   Patient have trouble swallowing? Patient denies   Does the patient cook?  Patient denies  . Wife does it  Any kitchen accidents such as leaving the stove on? Patient denies   Any headaches?  Patient denies   Double vision? Patient denies.  He has a history of cataracts right greater than left, with subsequent decrease in vision. Any focal numbness or tingling?  Patient denies   Chronic back pain Patient denies   Unilateral weakness?  Patient denies   Any tremors?  Patient denies   Any history of anosmia? Taste? Endorsed since 2018  Any incontinence of urine?  Patient denies   Any bowel dysfunction? Constipation due to Iron supplementation  History of heavy alcohol intake?  Patient denies   History of heavy tobacco use?  Patient denies   Family history of dementia? His mother had possibly Alzheimer's disease      03/18/22 MRI brain was remarkable for mild chronic small vessel ischemic changes within the cerebral white  matter, slightly progressed from the prior MRI of 09/19/2016. Mild-to-moderate generalized cerebral atrophy and mild cerebellar atrophy.   Pertinent labs September 2023: Vitamin B12 439 TSH 1.57    Past Medical History:   Diagnosis Date   Amaurosis fugax of right eye 09/03/2016   CAD in native artery 07/05/2021   Carotid stenosis 09/03/2016   Mild 05/2016   Chronic combined systolic and diastolic heart failure (HCC) 10/23/2016   Essential hypertension 09/03/2016   Hyperlipidemia    Hypertension    Memory loss 02/18/2022     Past Surgical History:  Procedure Laterality Date   BIOPSY  04/01/2019   Procedure: BIOPSY;  Surgeon: Kathi Der, MD;  [facility]: [facility];  Service: Gastroenterology;;   COLONOSCOPY WITH PROPOFOL N/A 04/01/2019   Procedure: COLONOSCOPY WITH PROPOFOL;  Surgeon: Kathi Der, MD;  Location: [facility]OS[facility];  Service: Gastroenterology;  Laterality: N/A;  ENTEROSCOPY N/A 04/01/2019   Procedure: Push ENTEROSCOPY;  Surgeon: Kathi Der, MD;  Location[facility] ENDOSCOPY;  Service: Gastroenterology;  Laterality: N/A;   EYE SURGERY     HOT HEMOSTASIS N/A 04/01/2019   Procedure: HOT HEMOSTASIS (ARGON PLASMA COAGULATION/BICAP);  Surgeon: Kathi Der, MD;  Location: [facility] ENDOSCOPY;  Service: Gastroenterology;  Laterality: N/A;   polyp removal     POLYPECTOMY  04/01/2019   Procedure: POLYPECTOMY;  Surgeon: Kathi Der, MD;  Location: [facility] ENDOSCOPY;  Service: Gastroenterology;;     PREVIOUS MEDICATIONS:   CURRENT MEDICATIONS:  Outpatient Encounter Medications as of 06/09/2023  Medication Sig   acetaminophen (TYLENOL) 160 MG/5ML suspension Take 240 mg by mouth every 6 (six) hours as needed for mild pain or headache.   amLODipine (NORVASC) 2.5 MG tablet Take 2.5 mg by mouth daily.   cetirizine (ZYRTEC) 10 MG tablet Take 10 mg by mouth daily as needed for allergies or rhinitis.   febuxostat (ULORIC) 40 MG tablet Take 40 mg by mouth daily.   ferrous sulfate 325 (65 FE) MG tablet Take 1 tablet (325 mg total) by mouth daily with breakfast.   memantine (NAMENDA) 5 MG tablet Take 1 tablet (5 mg total) by mouth at bedtime.   METAMUCIL FIBER PO Take 12 g by mouth See admin  instructions. Mix 12 grams of powder into 6-8 ounces of apple juice and drink once a day   mirtazapine (REMERON) 7.5 MG tablet Take 3.75-7.5 mg by mouth See admin instructions. Starting on 07/28/2021, take 3.75 mg by mouth at bedtime and increase to 7.5 mg after 1 week   MUCINEX FAST-MAX CONGEST COUGH 2.5-5-100 MG/5ML LIQD Take 5 mLs by mouth every 12 (twelve) hours as needed (for coughing).   Multiple Vitamins-Minerals (CENTRUM SILVER) CHEW Chew 1-2 tablets by mouth daily with breakfast.   rosuvastatin (CRESTOR) 40 MG tablet TAKE 1 Tablet BY MOUTH ONCE DAILY   senna (SENOKOT) 8.6 MG TABS tablet Take 1 tablet by mouth daily.   No facility-administered encounter medications on file as of 06/09/2023.     Objective:     PHYSICAL EXAMINATION:    VITALS:   Vitals:   06/09/23 1128  BP: 133/74  Pulse: 74  Resp: 18  SpO2: 95%  Weight: 166 lb (75.3 kg)    GEN:  The patient appears stated age and is in NAD. HEENT:  Normocephalic, atraumatic.   Neurological examination:  General: NAD, well-groomed, appears stated age. Orientation: The patient is alert. Oriented to person, place and date Cranial nerves: There is good facial symmetry.The speech is fluent and clear. No aphasia or dysarthria. Fund of knowledge is appropriate. Recent memory impaired and remote memory is normal.  Attention and concentration are normal.  Able to name objects and repeat phrases.  Hearing is intact to conversational tone.   Delayed recall 1/3 Sensation: Sensation is intact to light touch throughout Motor: Strength is at least antigravity x4. DTR's 2/4 in UE/LE      02/24/2022    2:00 PM  Montreal Cognitive Assessment   Visuospatial/ Executive (0/5) 4  Naming (0/3) 2  Attention: Read list of digits (0/2) 2  Attention: Read list of letters (0/1) 1  Attention: Serial 7 subtraction starting at 100 (0/3) 1  Language: Repeat phrase (0/2) 2  Language : Fluency (0/1) 0  Abstraction (0/2) 2  Delayed Recall (0/5)  2  Orientation (0/6) 6  Total 22  Adjusted Score (based on education) 22       06/09/2023   11:00  AM 10/16/2022    3:00 PM  MMSE - Mini Mental State Exam  Orientation to time 3 4  Orientation to Place 5 5  Registration 3 3  Attention/ Calculation 5 5  Recall 1 3  Language- name 2 objects 2 2  Language- repeat 1 1  Language- follow 3 step command 3 3  Language- read & follow direction 1 1  Write a sentence 1 1  Copy design 1 1  Total score 26 29       Movement examination: Tone: There is normal tone in the UE/LE Abnormal movements:  no tremor.  No myoclonus.  No asterixis.   Coordination:  There is no decremation with RAM's. Normal finger to nose  Gait and Station: The patient has no difficulty arising out of a deep-seated chair without the use of the hands. The patient's stride length is good.  Gait is cautious and narrow.   Thank you for allowing [place] the opportunity to participate in the care of this nice patient. Please do not hesitate to contact us for any questions or concerns.   Total time spent on today's visit was 30 minutes dedicated to this patient today, preparing to see patient, examining the patient, ordering tests and/or medications and counseling the patient, documenting clinical information in the EHR or other health record, independently interpreting results and communicating results to the patient/family, discussing treatment and goals, answering patient's questions and coordinating care.  Cc:  [name], NP  Marlowe Kays 06/09/2023 11:50 AM

## 2023-06-09 NOTE — Patient Instructions (Signed)
It was a pleasure to see you today at our office.   Recommendations:  Increase memantine  5 mg twice a day  Follow up in  6 months  Side effects were discussed  Monitor driving Recommend good control of cardiovascular risk factors.   Continue [facility]  Continue to control mood as per primary doctor   Whom to call:  Memory  decline, memory medications: Call our office [phone]   For psychiatric meds, mood meds: Please have your primary care physician manage these medications.    For assessment of decision of mental capacity and competency:  Call Dr. Erick Blinks, geriatric psychiatrist at [phone]  For guidance in geriatric dementia issues please call [facility] [phone]  For guidance regarding [facility] and if placement were needed at the facility, contact Sidney Ace, Social Worker tel: [phone]  If you have any severe symptoms of a stroke, or other severe issues such as confusion,severe chills or fever, etc call 911 or go to the ER as you may need to be evaluated further           RECOMMENDATIONS FOR ALL PATIENTS WITH MEMORY PROBLEMS: 1. Continue to exercise (Recommend 30 minutes of walking everyday, or 3 hours every week) 2. Increase social interactions - continue going to [place] and enjoy social gatherings with friends and family 3. Eat healthy, avoid fried foods and eat more fruits and vegetables 4. Maintain adequate blood pressure, blood sugar, and blood cholesterol level. Reducing the risk of stroke and cardiovascular disease also helps promoting better memory. 5. Avoid stressful situations. Live a simple life and avoid aggravations. Organize your time and prepare for the next day in anticipation. 6. Sleep well, avoid any interruptions of sleep and avoid any distractions in the bedroom that may interfere with adequate sleep quality 7. Avoid sugar, avoid sweets as there is  a strong link between excessive sugar intake, diabetes, and cognitive impairment We discussed the Mediterranean diet, which has been shown to help patients reduce the risk of progressive memory disorders and reduces cardiovascular risk. This includes eating fish, eat fruits and green leafy vegetables, nuts like almonds and hazelnuts, walnuts, and also use olive oil. Avoid fast foods and fried foods as much as possible. Avoid sweets and sugar as sugar use has been linked to worsening of memory function.  There is always a concern of gradual progression of memory problems. If this is the case, then we may need to adjust level of care according to patient needs. Support, both to the patient and caregiver, should then be put into place.     FALL PRECAUTIONS: Be cautious when walking. Scan the area for obstacles that may increase the risk of trips and falls. When getting up in the mornings, sit up at the edge of the bed for a few minutes before getting out of bed. Consider elevating the bed at the head end to avoid drop of blood pressure when getting up. Walk always in a well-lit room (use night lights in the walls). Avoid area rugs or power cords from appliances in the middle of the walkways. Use a walker or a cane if necessary and consider physical therapy for balance exercise. Get your eyesight checked regularly.  FINANCIAL OVERSIGHT: Supervision, especially oversight when making financial decisions or transactions is also recommended.  HOME SAFETY: Consider the safety of the kitchen when operating appliances like stoves, microwave oven, and blender. Consider having supervision and share  cooking responsibilities until no longer able to participate in those. Accidents with firearms and other hazards in the house should be identified and addressed as well.   ABILITY TO BE LEFT ALONE: If patient is unable to contact 911 operator, consider using LifeLine, or when the need is there, arrange for someone to  stay with patients. Smoking is a fire hazard, consider supervision or cessation. Risk of wandering should be assessed by caregiver and if detected at any point, supervision and safe proof recommendations should be instituted.  MEDICATION SUPERVISION: Inability to self-administer medication needs to be constantly addressed. Implement a mechanism to ensure safe administration of the medications.   DRIVING: Regarding driving, in patients with progressive memory problems, driving will be impaired. We advise to have someone else do the driving if trouble finding directions or if minor accidents are reported. Independent driving assessment is available to determine safety of driving.   If you are interested in the driving assessment, you can contact the following:  [employer] in [place] [phone]  [facility] [phone]  [facility] [phone] or [phone]    Mediterranean Diet A Mediterranean diet refers to food and lifestyle choices that are based on the traditions of countries located on the [employer]. This way of eating has been shown to help prevent certain conditions and improve outcomes for people who have chronic diseases, like kidney disease and heart disease. What are tips for following this plan? Lifestyle  Cook and eat meals together with your family, when possible. Drink enough fluid to keep your urine clear or pale yellow. Be physically active every day. This includes: Aerobic exercise like running or swimming. Leisure activities like gardening, walking, or housework. Get 7-8 hours of sleep each night. If recommended by your health care provider, drink red wine in moderation. This means 1 glass a day for nonpregnant women and 2 glasses a day for men. A glass of wine equals 5 oz (150 mL). Reading food labels  Check the serving size of packaged foods. For foods such as rice and pasta,  the serving size refers to the amount of cooked product, not dry. Check the total fat in packaged foods. Avoid foods that have saturated fat or trans fats. Check the ingredients list for added sugars, such as corn syrup. Shopping  At the grocery store, buy most of your food from the areas near the walls of the store. This includes: Fresh fruits and vegetables (produce). Grains, beans, nuts, and seeds. Some of these may be available in unpackaged forms or large amounts (in bulk). Fresh seafood. Poultry and eggs. Low-fat dairy products. Buy whole ingredients instead of prepackaged foods. Buy fresh fruits and vegetables in-season from local farmers markets. Buy frozen fruits and vegetables in resealable bags. If you do not have access to quality fresh seafood, buy precooked frozen shrimp or canned fish, such as tuna, salmon, or sardines. Buy small amounts of raw or cooked vegetables, salads, or olives from the deli or salad bar at your store. Stock your pantry so you always have certain foods on hand, such as olive oil, canned tuna, canned tomatoes, rice, pasta, and beans. Cooking  Cook foods with extra-virgin olive oil instead of using butter or other vegetable oils. Have meat as a side dish, and have vegetables or grains as your main dish. This means having meat in small portions or adding small amounts of meat to foods like pasta or stew. Use beans or vegetables instead  of meat in common dishes like chili or lasagna. Experiment with different cooking methods. Try roasting or broiling vegetables instead of steaming or sauteing them. Add frozen vegetables to soups, stews, pasta, or rice. Add nuts or seeds for added healthy fat at each meal. You can add these to yogurt, salads, or vegetable dishes. Marinate fish or vegetables using olive oil, lemon juice, garlic, and fresh herbs. Meal planning  Plan to eat 1 vegetarian meal one day each week. Try to work up to 2 vegetarian meals, if  possible. Eat seafood 2 or more times a week. Have healthy snacks readily available, such as: Vegetable sticks with hummus. Greek yogurt. Fruit and nut trail mix. Eat balanced meals throughout the week. This includes: Fruit: 2-3 servings a day Vegetables: 4-5 servings a day Low-fat dairy: 2 servings a day Fish, poultry, or lean meat: 1 serving a day Beans and legumes: 2 or more servings a week Nuts and seeds: 1-2 servings a day Whole grains: 6-8 servings a day Extra-virgin olive oil: 3-4 servings a day Limit red meat and sweets to only a few servings a month What are my food choices? Mediterranean diet Recommended Grains: Whole-grain pasta. Brown rice. Bulgar wheat. Polenta. Couscous. Whole-wheat bread. Orpah Cobb. Vegetables: Artichokes. Beets. Broccoli. Cabbage. Carrots. Eggplant. Green beans. Chard. Kale. Spinach. Onions. Leeks. Peas. Squash. Tomatoes. Peppers. Radishes. Fruits: Apples. Apricots. Avocado. Berries. Bananas. Cherries. Dates. Figs. Grapes. Lemons. Melon. Oranges. Peaches. Plums. Pomegranate. Meats and other protein foods: Beans. Almonds. Sunflower seeds. Pine nuts. Peanuts. Cod. Salmon. Scallops. Shrimp. Tuna. Tilapia. Clams. Oysters. Eggs. Dairy: Low-fat milk. Cheese. Greek yogurt. Beverages: Water. Red wine. Herbal tea. Fats and oils: Extra virgin olive oil. Avocado oil. Grape seed oil. Sweets and desserts: [place] yogurt with honey. Baked apples. Poached pears. Trail mix. Seasoning and other foods: Basil. Cilantro. Coriander. Cumin. Mint. Parsley. Sage. Rosemary. Tarragon. Garlic. Oregano. Thyme. Pepper. Balsalmic vinegar. Tahini. Hummus. Tomato sauce. Olives. Mushrooms. Limit these Grains: Prepackaged pasta or rice dishes. Prepackaged cereal with added sugar. Vegetables: Deep fried potatoes (french fries). Fruits: Fruit canned in syrup. Meats and other protein foods: Beef. Pork. Lamb. Poultry with skin. Hot dogs. Tomasa Blase. Dairy: Ice cream. Sour cream. Whole  milk. Beverages: Juice. Sugar-sweetened soft drinks. Beer. Liquor and spirits. Fats and oils: Butter. Canola oil. Vegetable oil. Beef fat (tallow). Lard. Sweets and desserts: Cookies. Cakes. Pies. Candy. Seasoning and other foods: Mayonnaise. Premade sauces and marinades. The items listed may not be a complete list. Talk with your dietitian about what dietary choices are right for you. Summary The Mediterranean diet includes both food and lifestyle choices. Eat a variety of fresh fruits and vegetables, beans, nuts, seeds, and whole grains. Limit the amount of red meat and sweets that you eat. Talk with your health care provider about whether it is safe for you to drink red wine in moderation. This means 1 glass a day for nonpregnant women and 2 glasses a day for men. A glass of wine equals 5 oz (150 mL). This information is not intended to replace advice given to you by your health care provider. Make sure you discuss any questions you have with your health care provider. Document Released: 01/24/2016 Document Revised: 02/26/2016 Document Reviewed: 01/24/2016 Elsevier Interactive Patient Education  2017 [employer].    We have sent a referral to [facility] for your MRI and they will call you directly to schedule your appointment. They are located at [address] [facility]. If you need to contact them directly please  call [phone].  Your provider has requested that you have labwork completed today. Please go to [facility] ([id number]) on the second floor of this building before leaving the office today. You do not need to check in. If you are not called within 15 minutes please check with the front desk.

## 2023-06-11 ENCOUNTER — Other Ambulatory Visit: Payer: Self-pay | Admitting: Physician Assistant

## 2023-06-11 ENCOUNTER — Telehealth: Payer: Self-pay | Admitting: Physician Assistant

## 2023-06-11 ENCOUNTER — Other Ambulatory Visit: Payer: Self-pay

## 2023-06-11 MED ORDER — MEMANTINE HCL 5 MG PO TABS
5.0000 mg | ORAL_TABLET | Freq: Two times a day (BID) | ORAL | 6 refills | Status: DC
  Filled 2023-06-11: qty 30, 15d supply, fill #0

## 2023-06-11 MED ORDER — MEMANTINE HCL 5 MG PO TABS
5.0000 mg | ORAL_TABLET | Freq: Two times a day (BID) | ORAL | 11 refills | Status: DC
  Filled 2023-06-11: qty 60, 30d supply, fill #0

## 2023-06-11 NOTE — Telephone Encounter (Signed)
Stephen Chavez with my pharmacy called and Left a message. The patient states he saw Stephen Chavez on 06/09/23 and they were supposed to send in a new refill for memantine 5mg . He is taking 5mg  once daily and it was supposed to be 5mg  twice daily per the patient. They have not received the new RX so they were calling to inquire about it.

## 2023-06-12 ENCOUNTER — Telehealth: Payer: Self-pay | Admitting: Physician Assistant

## 2023-06-12 NOTE — Telephone Encounter (Signed)
1. Which medications need refilled? (List name and dosage, if known) memantine increase  2. Which pharmacy/location is medication to be sent to? (include street and city if local pharmacy) my pharmacy on [address]

## 2023-06-15 ENCOUNTER — Other Ambulatory Visit: Payer: Self-pay

## 2023-06-15 MED ORDER — MEMANTINE HCL 5 MG PO TABS: 5.0000 mg | ORAL_TABLET | Freq: Two times a day (BID) | ORAL | 2 refills | Status: DC

## 2023-06-15 NOTE — Telephone Encounter (Signed)
Sent rx in to pharmacy 

## 2023-06-16 ENCOUNTER — Other Ambulatory Visit: Payer: Self-pay

## 2023-06-16 ENCOUNTER — Encounter (HOSPITAL_BASED_OUTPATIENT_CLINIC_OR_DEPARTMENT_OTHER): Payer: Self-pay | Admitting: Cardiovascular Disease

## 2023-06-22 ENCOUNTER — Encounter: Payer: Self-pay | Admitting: Internal Medicine

## 2023-06-29 ENCOUNTER — Inpatient Hospital Stay: Payer: Medicare (Managed Care) | Admitting: Internal Medicine

## 2023-06-29 ENCOUNTER — Inpatient Hospital Stay: Payer: Medicare (Managed Care) | Attending: Internal Medicine

## 2023-06-29 VITALS — BP 111/62 | HR 72 | Temp 98.4°F | Resp 16 | Ht 72.0 in | Wt 165.4 lb

## 2023-06-29 DIAGNOSIS — D500 Iron deficiency anemia secondary to blood loss (chronic): Secondary | ICD-10-CM

## 2023-06-29 DIAGNOSIS — D508 Other iron deficiency anemias: Secondary | ICD-10-CM

## 2023-06-29 DIAGNOSIS — K922 Gastrointestinal hemorrhage, unspecified: Secondary | ICD-10-CM | POA: Diagnosis not present

## 2023-06-29 LAB — IRON AND IRON BINDING CAPACITY (CC-WL,HP ONLY)
Iron: 53 ug/dL (ref 45–182)
Saturation Ratios: 17 % — ABNORMAL LOW (ref 17.9–39.5)
TIBC: 316 ug/dL (ref 250–450)
UIBC: 263 ug/dL (ref 117–376)

## 2023-06-29 LAB — CBC WITH DIFFERENTIAL (CANCER CENTER ONLY)
Abs Immature Granulocytes: 0.02 10*3/uL (ref 0.00–0.07)
Basophils Absolute: 0 10*3/uL (ref 0.0–0.1)
Basophils Relative: 0 %
Eosinophils Absolute: 0.4 10*3/uL (ref 0.0–0.5)
Eosinophils Relative: 5 %
HCT: 29.4 % — ABNORMAL LOW (ref 39.0–52.0)
Hemoglobin: 9.6 g/dL — ABNORMAL LOW (ref 13.0–17.0)
Immature Granulocytes: 0 %
Lymphocytes Relative: 25 %
Lymphs Abs: 1.9 10*3/uL (ref 0.7–4.0)
MCH: 27 pg (ref 26.0–34.0)
MCHC: 32.7 g/dL (ref 30.0–36.0)
MCV: 82.6 fL (ref 80.0–100.0)
Monocytes Absolute: 0.5 10*3/uL (ref 0.1–1.0)
Monocytes Relative: 6 %
Neutro Abs: 4.7 10*3/uL (ref 1.7–7.7)
Neutrophils Relative %: 64 %
Platelet Count: 187 10*3/uL (ref 150–400)
RBC: 3.56 MIL/uL — ABNORMAL LOW (ref 4.22–5.81)
RDW: 14.7 % (ref 11.5–15.5)
WBC Count: 7.5 10*3/uL (ref 4.0–10.5)
nRBC: 0 % (ref 0.0–0.2)

## 2023-06-29 LAB — FERRITIN: Ferritin: 29 ng/mL (ref 24–336)

## 2023-06-29 NOTE — Progress Notes (Signed)
 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  PRIOR THERAPY: Iron  infusion with Ferrlecit  weekly for 4 weeks started April 15, 2019  CURRENT THERAPY: Ferrous sulfate  325 mg p.o. daily.  INTERVAL HISTORY: Stephen Chavez 89 y.o. male returns to the clinic today for follow-up visit accompanied by his wife.Discussed the use of AI scribe software for clinical note transcription with the patient, who gave verbal consent to proceed.  History of Present Illness   The patient, an 89 year old with a history of iron  deficiency anemia secondary to gastrointestinal blood loss from arteriovenous malformations, has been maintaining his condition with oral iron  supplements taken three times a week. The patient reports feeling generally well, with no new symptoms such as chest pain, breathing issues, or dizziness. There is no reported bleeding.  However, the patient's spouse notes an increased tendency for the patient to stay in bed or on the couch, suggesting possible fatigue. The patient denies excessive sleep but admits to spending a significant amount of time lying down.  The patient's hemoglobin and hematocrit levels remain similar to previous measurements, indicating a stable anemic state.          MEDICAL HISTORY: Past Medical History:  Diagnosis Date   Amaurosis fugax of right eye 09/03/2016   CAD in native artery 07/05/2021   Carotid stenosis 09/03/2016   Mild 05/2016   Chronic combined systolic and diastolic heart failure (HCC) 10/23/2016   Essential hypertension 09/03/2016   Hyperlipidemia    Hypertension    Memory loss 02/18/2022    ALLERGIES:  is allergic to allopurinol , colchicine, and febuxostat .  MEDICATIONS:  Current Outpatient Medications   Medication Sig Dispense Refill   acetaminophen  (TYLENOL ) 160 MG/5ML suspension Take 240 mg by mouth every 6 (six) hours as needed for mild pain or headache.     amLODipine  (NORVASC ) 2.5 MG tablet Take 2.5 mg by mouth daily.     cetirizine (ZYRTEC) 10 MG tablet Take 10 mg by mouth daily as needed for allergies or rhinitis.     febuxostat  (ULORIC ) 40 MG tablet Take 40 mg by mouth daily.     ferrous sulfate  325 (65 FE) MG tablet Take 1 tablet (325 mg total) by mouth daily with breakfast.  3   memantine  (NAMENDA ) 5 MG tablet Take 1 tablet (5 mg total) by mouth 2 (two) times daily. 60 tablet 2   METAMUCIL FIBER PO Take 12 g by mouth See admin instructions. Mix 12 grams of powder into 6-8 ounces of apple juice and drink once a day     mirtazapine  (REMERON ) 7.5 MG tablet Take 3.75-7.5 mg by mouth See admin instructions. Starting on 07/28/2021, take 3.75 mg by mouth at bedtime and increase to 7.5 mg after 1 week     MUCINEX  FAST-MAX CONGEST COUGH 2.5-5-100 MG/5ML LIQD Take 5 mLs by mouth every 12 (twelve) hours as needed (for coughing).     Multiple Vitamins-Minerals (CENTRUM SILVER) CHEW Chew 1-2 tablets by mouth daily with breakfast.     rosuvastatin  (CRESTOR ) 40 MG tablet TAKE 1 Tablet BY MOUTH ONCE DAILY 90 tablet 2   senna (SENOKOT) 8.6 MG TABS tablet Take 1 tablet by mouth daily.     No current facility-administered medications for this visit.    SURGICAL HISTORY:  Past Surgical History:  Procedure Laterality Date   BIOPSY  04/01/2019   Procedure: BIOPSY;  Surgeon: Elicia Claw, MD;  Location: [facility];  Service: Gastroenterology;;   COLONOSCOPY WITH PROPOFOL  N/A 04/01/2019   Procedure: COLONOSCOPY WITH PROPOFOL ;  Surgeon: Elicia Claw, MD;  Location: [facility];  Service: Gastroenterology;  Laterality: N/A;   ENTEROSCOPY N/A 04/01/2019   Procedure: Push ENTEROSCOPY;  Surgeon: Elicia Claw, MD;  Location: [facility];  Service: Gastroenterology;  Laterality: N/A;   EYE  SURGERY     HOT HEMOSTASIS N/A 04/01/2019   Procedure: HOT HEMOSTASIS (ARGON PLASMA COAGULATION/BICAP);  Surgeon: Elicia Claw, MD;  Location: [facility];  Service: Gastroenterology;  Laterality: N/A;   polyp removal     POLYPECTOMY  04/01/2019   Procedure: POLYPECTOMY;  Surgeon: Elicia Claw, MD;  Location: [facility];  Service: Gastroenterology;;    REVIEW OF SYSTEMS:  A comprehensive review of systems was negative except for: Constitutional: positive for fatigue   PHYSICAL EXAMINATION: General appearance: alert, cooperative, fatigued, and no distress Head: Normocephalic, without obvious abnormality, atraumatic Neck: no adenopathy, no JVD, supple, symmetrical, trachea midline, and thyroid  not enlarged, symmetric, no tenderness/mass/nodules Lymph nodes: Cervical, supraclavicular, and axillary nodes normal. Resp: clear to auscultation bilaterally Back: symmetric, no curvature. ROM normal. No CVA tenderness. Cardio: regular rate and rhythm, S1, S2 normal, no murmur, click, rub or gallop GI: soft, non-tender; bowel sounds normal; no masses,  no organomegaly Extremities: extremities normal, atraumatic, no cyanosis or edema  ECOG PERFORMANCE STATUS: 1 - Symptomatic but completely ambulatory  Blood pressure 111/62, pulse 72, temperature 98.4 F (36.9 C), temperature source Temporal, resp. rate 16, height 6' (1.829 m), weight 165 lb 6.4 oz (75 kg), SpO2 99%.  LABORATORY DATA: Lab Results  Component Value Date   WBC 7.5 06/29/2023   HGB 9.6 (L) 06/29/2023   HCT 29.4 (L) 06/29/2023   MCV 82.6 06/29/2023   PLT 187 06/29/2023      Chemistry      Component Value Date/Time   NA 138 08/01/2021 0220   K 4.6 08/01/2021 0220   CL 104 08/01/2021 0220   CO2 23 08/01/2021 0220   BUN 18 08/01/2021 0220   CREATININE 2.05 (H) 08/01/2021 0220   CREATININE 1.94 (H) 12/22/2019 1122      Component Value Date/Time   CALCIUM  9.5 08/01/2021 0220   ALKPHOS 59 08/01/2021 0220   AST 17  08/01/2021 0220   AST 13 (L) 12/22/2019 1122   ALT 11 08/01/2021 0220   ALT 17 12/22/2019 1122   BILITOT 0.6 08/01/2021 0220   BILITOT 0.4 12/22/2019 1122       RADIOGRAPHIC STUDIES: No results found.  ASSESSMENT AND PLAN: This is a very pleasant 89 years old African-American male with history of anemia of chronic disease plus iron  deficiency anemia secondary to gastrointestinal blood loss from AV malformation. The patient was treated with intravenous iron  infusion with Ferrlecit  weekly for 4 weeks.  Last treatment was in November 2020.  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 Hemoglobin is 9.6 and hematocrit is 29.4, consistent with previous values from January and July 2024. Currently well-managed with oral iron  supplementation. No new symptoms such as chest pain, dyspnea, dizziness, or bleeding reported. Experiences fatigue, likely influenced by anemia and lack of activity. Discussed potential need for intravenous iron  infusion if iron  studies show significant deficiency. Last infusion was in November 2020. Explained that taking iron  with orange juice or vitamin C can enhance  absorption. - Continue oral iron  supplementation (Monday, Wednesday, Friday) with orange juice or vitamin C - Order iron  studies to assess current iron  levels - Arrange for intravenous iron  infusion if iron  studies show significant deficiency - Schedule follow-up appointment in six months.   Patient was advised to call immediately if he has any other concerning symptoms in the interval.  The patient voices understanding of current disease status and treatment options and is in agreement with the current care plan. All questions were answered. The patient knows to call the clinic with any problems, questions or concerns. We can certainly see the patient much sooner if necessary.  Disclaimer: This note was dictated with voice  recognition software. Similar sounding words can inadvertently be transcribed and may not be corrected upon review.

## 2023-08-04 ENCOUNTER — Other Ambulatory Visit (HOSPITAL_BASED_OUTPATIENT_CLINIC_OR_DEPARTMENT_OTHER): Payer: Self-pay | Admitting: Cardiovascular Disease

## 2023-08-04 ENCOUNTER — Other Ambulatory Visit: Payer: Self-pay | Admitting: Physician Assistant

## 2023-09-16 ENCOUNTER — Other Ambulatory Visit: Payer: Self-pay | Admitting: Gastroenterology

## 2023-09-16 DIAGNOSIS — K862 Cyst of pancreas: Secondary | ICD-10-CM

## 2023-10-09 ENCOUNTER — Encounter: Payer: Self-pay | Admitting: Physician Assistant

## 2023-10-24 ENCOUNTER — Ambulatory Visit
Admission: RE | Admit: 2023-10-24 | Discharge: 2023-10-24 | Disposition: A | Discharge status: Home / Self Care | Payer: Medicare (Managed Care) | Source: Ambulatory Visit | Attending: Gastroenterology | Admitting: Gastroenterology

## 2023-10-24 DIAGNOSIS — K862 Cyst of pancreas: Secondary | ICD-10-CM

## 2023-10-24 MED ORDER — GADOPICLENOL 0.5 MMOL/ML IV SOLN
7.0000 mL | Freq: Once | INTRAVENOUS | Status: AC | PRN
  Administered 2023-10-24: 7 mL via INTRAVENOUS

## 2023-12-08 ENCOUNTER — Ambulatory Visit: Payer: Medicare HMO | Admitting: Physician Assistant

## 2023-12-10 ENCOUNTER — Ambulatory Visit: Payer: Medicare (Managed Care) | Admitting: Physician Assistant

## 2023-12-10 ENCOUNTER — Encounter: Payer: Self-pay | Admitting: Physician Assistant

## 2023-12-10 VITALS — BP 111/61 | HR 75 | Ht 72.0 in | Wt 158.0 lb

## 2023-12-10 DIAGNOSIS — G3184 Mild cognitive impairment, so stated: Secondary | ICD-10-CM | POA: Diagnosis not present

## 2023-12-10 NOTE — Patient Instructions (Addendum)
 It was a pleasure to see you today at our office.   Recommendations:   memantine   5 mg twice a day  Follow up in  6 months  Side effects were discussed  Recommend good control of cardiovascular risk factors.   Continue Iron  [facility]  Continue to control mood as per primary doctor   Whom to call:  Memory  decline, memory medications: Call our office [phone]   For psychiatric meds, mood meds: Please have your primary care physician manage these medications.    For assessment of decision of mental capacity and competency:  Call Dr. Rosaline Nine, geriatric psychiatrist at [phone]  For guidance in geriatric dementia issues please call [facility] [phone]  For guidance regarding [facility] and if placement were needed at the facility, contact Nat Hock, Social Worker tel: [phone]  If you have any severe symptoms of a stroke, or other severe issues such as confusion,severe chills or fever, etc call 911 or go to the ER as you may need to be evaluated further           RECOMMENDATIONS FOR ALL PATIENTS WITH MEMORY PROBLEMS: 1. Continue to exercise (Recommend 30 minutes of walking everyday, or 3 hours every week) 2. Increase social interactions - continue going to [place] and enjoy social gatherings with friends and family 3. Eat healthy, avoid fried foods and eat more fruits and vegetables 4. Maintain adequate blood pressure, blood sugar, and blood cholesterol level. Reducing the risk of stroke and cardiovascular disease also helps promoting better memory. 5. Avoid stressful situations. Live a simple life and avoid aggravations. Organize your time and prepare for the next day in anticipation. 6. Sleep well, avoid any interruptions of sleep and avoid any distractions in the bedroom that may interfere with adequate sleep quality 7. Avoid sugar, avoid sweets as there is a strong link between  excessive sugar intake, diabetes, and cognitive impairment We discussed the Mediterranean diet, which has been shown to help patients reduce the risk of progressive memory disorders and reduces cardiovascular risk. This includes eating fish, eat fruits and green leafy vegetables, nuts like almonds and hazelnuts, walnuts, and also use olive oil. Avoid fast foods and fried foods as much as possible. Avoid sweets and sugar as sugar use has been linked to worsening of memory function.  There is always a concern of gradual progression of memory problems. If this is the case, then we may need to adjust level of care according to patient needs. Support, both to the patient and caregiver, should then be put into place.     FALL PRECAUTIONS: Be cautious when walking. Scan the area for obstacles that may increase the risk of trips and falls. When getting up in the mornings, sit up at the edge of the bed for a few minutes before getting out of bed. Consider elevating the bed at the head end to avoid drop of blood pressure when getting up. Walk always in a well-lit room (use night lights in the walls). Avoid area rugs or power cords from appliances in the middle of the walkways. Use a walker or a cane if necessary and consider physical therapy for balance exercise. Get your eyesight checked regularly.  FINANCIAL OVERSIGHT: Supervision, especially oversight when making financial decisions or transactions is also recommended.  HOME SAFETY: Consider the safety of the kitchen when operating appliances like stoves, microwave oven, and blender. Consider having supervision and share cooking responsibilities  until no longer able to participate in those. Accidents with firearms and other hazards in the house should be identified and addressed as well.   ABILITY TO BE LEFT ALONE: If patient is unable to contact 911 operator, consider using LifeLine, or when the need is there, arrange for someone to stay with patients.  Smoking is a fire hazard, consider supervision or cessation. Risk of wandering should be assessed by caregiver and if detected at any point, supervision and safe proof recommendations should be instituted.  MEDICATION SUPERVISION: Inability to self-administer medication needs to be constantly addressed. Implement a mechanism to ensure safe administration of the medications.   DRIVING: Regarding driving, in patients with progressive memory problems, driving will be impaired. We advise to have someone else do the driving if trouble finding directions or if minor accidents are reported. Independent driving assessment is available to determine safety of driving.   If you are interested in the driving assessment, you can contact the following:  [employer] in [place] [phone]  [facility] [phone]  [facility] [phone] or [phone]    Mediterranean Diet A Mediterranean diet refers to food and lifestyle choices that are based on the traditions of countries located on the [employer]. This way of eating has been shown to help prevent certain conditions and improve outcomes for people who have chronic diseases, like kidney disease and heart disease. What are tips for following this plan? Lifestyle  Cook and eat meals together with your family, when possible. Drink enough fluid to keep your urine clear or pale yellow. Be physically active every day. This includes: Aerobic exercise like running or swimming. Leisure activities like gardening, walking, or housework. Get 7-8 hours of sleep each night. If recommended by your health care provider, drink red wine in moderation. This means 1 glass a day for nonpregnant women and 2 glasses a day for men. A glass of wine equals 5 oz (150 mL). Reading food labels  Check the serving size of packaged foods. For foods such as rice and pasta, the serving size  refers to the amount of cooked product, not dry. Check the total fat in packaged foods. Avoid foods that have saturated fat or trans fats. Check the ingredients list for added sugars, such as corn syrup. Shopping  At the grocery store, buy most of your food from the areas near the walls of the store. This includes: Fresh fruits and vegetables (produce). Grains, beans, nuts, and seeds. Some of these may be available in unpackaged forms or large amounts (in bulk). Fresh seafood. Poultry and eggs. Low-fat dairy products. Buy whole ingredients instead of prepackaged foods. Buy fresh fruits and vegetables in-season from local farmers markets. Buy frozen fruits and vegetables in resealable bags. If you do not have access to quality fresh seafood, buy precooked frozen shrimp or canned fish, such as tuna, salmon, or sardines. Buy small amounts of raw or cooked vegetables, salads, or olives from the deli or salad bar at your store. Stock your pantry so you always have certain foods on hand, such as olive oil, canned tuna, canned tomatoes, rice, pasta, and beans. Cooking  Cook foods with extra-virgin olive oil instead of using butter or other vegetable oils. Have meat as a side dish, and have vegetables or grains as your main dish. This means having meat in small portions or adding small amounts of meat to foods like pasta or stew. Use beans or vegetables instead of meat  in common dishes like chili or lasagna. Experiment with different cooking methods. Try roasting or broiling vegetables instead of steaming or sauteing them. Add frozen vegetables to soups, stews, pasta, or rice. Add nuts or seeds for added healthy fat at each meal. You can add these to yogurt, salads, or vegetable dishes. Marinate fish or vegetables using olive oil, lemon juice, garlic, and fresh herbs. Meal planning  Plan to eat 1 vegetarian meal one day each week. Try to work up to 2 vegetarian meals, if possible. Eat seafood 2 or  more times a week. Have healthy snacks readily available, such as: Vegetable sticks with hummus. Greek yogurt. Fruit and nut trail mix. Eat balanced meals throughout the week. This includes: Fruit: 2-3 servings a day Vegetables: 4-5 servings a day Low-fat dairy: 2 servings a day Fish, poultry, or lean meat: 1 serving a day Beans and legumes: 2 or more servings a week Nuts and seeds: 1-2 servings a day Whole grains: 6-8 servings a day Extra-virgin olive oil: 3-4 servings a day Limit red meat and sweets to only a few servings a month What are my food choices? Mediterranean diet Recommended Grains: Whole-grain pasta. Brown rice. Bulgar wheat. Polenta. Couscous. Whole-wheat bread. [place]. Vegetables: Artichokes. Beets. Broccoli. Cabbage. Carrots. Eggplant. Green beans. Chard. Kale. Spinach. Onions. Leeks. Peas. Squash. Tomatoes. Peppers. Radishes. Fruits: Apples. Apricots. Avocado. Berries. Bananas. Cherries. Dates. Figs. Grapes. Lemons. Melon. Oranges. Peaches. Plums. Pomegranate. Meats and other protein foods: Beans. Almonds. Sunflower seeds. Pine nuts. Peanuts. Cod. Salmon. Scallops. Shrimp. Tuna. Tilapia. Clams. Oysters. Eggs. Dairy: Low-fat milk. Cheese. Greek yogurt. Beverages: Water. Red wine. Herbal tea. Fats and oils: Extra virgin olive oil. Avocado oil. Grape seed oil. Sweets and desserts: [place] yogurt with honey. Baked apples. Poached pears. Trail mix. Seasoning and other foods: Basil. Cilantro. Coriander. Cumin. Mint. Parsley. Sage. Rosemary. Tarragon. Garlic. Oregano. Thyme. Pepper. Balsalmic vinegar. Tahini. Hummus. Tomato sauce. Olives. Mushrooms. Limit these Grains: Prepackaged pasta or rice dishes. Prepackaged cereal with added sugar. Vegetables: Deep fried potatoes (french fries). Fruits: Fruit canned in syrup. Meats and other protein foods: Beef. Pork. Lamb. Poultry with skin. Hot dogs. [place]. Dairy: Ice cream. Sour cream. Whole milk. Beverages: Juice.  Sugar-sweetened soft drinks. Beer. Liquor and spirits. Fats and oils: Butter. Canola oil. Vegetable oil. Beef fat (tallow). Lard. Sweets and desserts: Cookies. Cakes. Pies. Candy. Seasoning and other foods: Mayonnaise. Premade sauces and marinades. The items listed may not be a complete list. Talk with your dietitian about what dietary choices are right for you. Summary The Mediterranean diet includes both food and lifestyle choices. Eat a variety of fresh fruits and vegetables, beans, nuts, seeds, and whole grains. Limit the amount of red meat and sweets that you eat. Talk with your health care provider about whether it is safe for you to drink red wine in moderation. This means 1 glass a day for nonpregnant women and 2 glasses a day for men. A glass of wine equals 5 oz (150 mL). This information is not intended to replace advice given to you by your health care provider. Make sure you discuss any questions you have with your health care provider. Document Released: 01/24/2016 Document Revised: 02/26/2016 Document Reviewed: 01/24/2016 Elsevier Interactive Patient Education  2017 [employer].    We have sent a referral to [facility] for your MRI and they will call you directly to schedule your appointment. They are located at [address] [facility]. If you need to contact them directly please call [phone].  Your provider has requested that you have labwork completed today. Please go to [facility] ([id number]) on the second floor of this building before leaving the office today. You do not need to check in. If you are not called within 15 minutes please check with the front desk.

## 2023-12-10 NOTE — Progress Notes (Signed)
 Assessment/Plan:     Mild Cognitive impairment  Stephen Chavez is a very pleasant 89 y.o. 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 Patient is on memantine  5 mg twice daily, tolerating well. MMSE is 26/30.  Memory is stable. 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 Mood is low, as he is recuperating of major deaths in the family.      Recommendations:   Follow up in  6 months. Continue memantine  5 mg bid, side effects discussed Recommend good control of cardiovascular risk factors Continue to control mood as per PCP Recommend checking hearing    Subjective:   This patient is accompanied in the office by his wife  who supplements the history. Previous records as well as any outside records available were reviewed prior to todays visit.   Patient was last seen on 06/09/2023.    Any changes in memory since last visit? About the same.  He has difficulties with word retrieval.  He denies any issues remembering conversations or names of people.  Long-term memory is good. repeats oneself?  Denies, he is less talkative.  Disoriented when walking into a room?  Patient denies   Misplacing objects?  Patient denies   Wandering behavior?   Denies. Any personality changes since last visit? Denies.   Any worsening depression?:  Wife thinks he is depressed, after lost his daughter to cancer and his grandson to MI both in 08/2023  Hallucinations or paranoia?  Denies.   Seizures?   Denies.    Any sleep changes? Sleeps well. Denies vivid dreams, REM behavior or sleepwalking   Sleep apnea?    Denies.  Any hygiene concerns?   He needs reminder to take a shower Independent of bathing and dressing?  Endorsed  Does the patient needs help with medications?  Wife is in charge  Who is in charge of the finances?  Wife is in charge    Any changes in appetite?  He is not eating well -wife says Patient have trouble swallowing?  Denies.   Does the patient cook?  Any kitchen accidents such as leaving the stove on?   Denies.   Any headaches?    Denies.   Vision changes? Denies. Chronic pain?  Denies.   Ambulates with difficulty?    Denies.  He likes walking with his son on a regular basis   Recent falls or head injuries?    Denies.      Unilateral weakness, numbness or tingling?  Denies.   Any tremors?  Denies.   Any anosmia?  Endorsed since 2019 Any incontinence of urine?  Denies.   Any bowel dysfunction?  He has chronic constipation due to iron  supplements, he takes Benefiber    Patient lives with his wife.  Does the patient drive?  No longer driving    Initial visit How long did patient have memory difficulties?  Patient states that these memory issues may have started about 3 months ago, but wife reports they have been making it to 1 year, having slowed down since May.  Short-term memory is worse than long-term memory.  At  times, he reports knowing the word, but cannot retrieve it.  He is to be more active, is increasing his activity, he notes feeling tired, does not want to participate in the events that priorly he used to enjoy..  Patient lives with: Spouse  repeats oneself? endorsed Disoriented when walking into a room?  Patient denies .  Leaving objects in unusual places?  Patient denies   Ambulates  with difficulty?   Patient denies   Recent falls?  Patient denies   Any head injuries?  Patient denies   History of seizures?   Patient denies   Wandering behavior?  Patient denies   Patient drives?   Endorsed.  His wife reports that sometimes he gets confused with driving.   Since May he only.  Short distances  Any mood changes such irritability agitation?  Patient denies sometimes he gets  testy  Any history of depression?:  Endorsed.  Patient lost his son in February of this year, which has proxy depression to him.  Hallucinations?  Patient denies   Paranoia?  Patient denies   Patient reports that sleeps well without vivid dreams, REM behavior or sleepwalking    History of sleep apnea?  Denies  Any hygiene concerns?  Endorsed, sometimes she has to remind him to take a shower.   Independent of bathing and dressing?  Endorsed  Does the patient needs help with medications? Wife in charge  Who is in charge of the finances?  Wife is in charge   Any changes in appetite?  Patient denies   Patient have trouble swallowing? Patient denies   Does the patient cook?  Patient denies  . Wife does it  Any kitchen accidents such as leaving the stove on? Patient denies   Any headaches?  Patient denies   Double vision? Patient denies.  He has a history of cataracts right greater than left, with subsequent decrease in vision. Any focal numbness or tingling?  Patient denies   Chronic back pain Patient denies   Unilateral weakness?  Patient denies   Any tremors?  Patient denies   Any history of anosmia? Taste? Endorsed since 2018  Any incontinence of urine?  Patient denies   Any bowel dysfunction? Constipation due to Iron  supplementation  History of heavy alcohol intake?  Patient denies   History of heavy tobacco use?  Patient denies   Family history of dementia? His mother had possibly Alzheimer's disease      03/18/22 MRI brain was remarkable for mild chronic small vessel ischemic changes within the cerebral white  matter, slightly progressed from the prior MRI of 09/19/2016. Mild-to-moderate generalized cerebral atrophy and mild cerebellar atrophy.    Past Medical History:  Diagnosis Date   Amaurosis fugax of right eye 09/03/2016   CAD in native artery 07/05/2021    Carotid stenosis 09/03/2016   Mild 05/2016   Chronic combined systolic and diastolic heart failure (HCC) 10/23/2016   Essential hypertension 09/03/2016   Hyperlipidemia    Hypertension    Memory loss 02/18/2022     Past Surgical History:  Procedure Laterality Date   BIOPSY  04/01/2019   Procedure: BIOPSY;  Surgeon: Elicia Claw, MD;  Location: [facility];  Service: Gastroenterology;;   COLONOSCOPY WITH PROPOFOL  N/A 04/01/2019   Procedure: COLONOSCOPY WITH PROPOFOL ;  Surgeon: Elicia Claw, MD;  Location: [facility];  Service: Gastroenterology;  Laterality: N/A;   ENTEROSCOPY N/A 04/01/2019   Procedure: Push ENTEROSCOPY;  Surgeon: Elicia Claw, MD;  Location: [facility];  Service: Gastroenterology;  Laterality: N/A;   EYE SURGERY     HOT HEMOSTASIS N/A 04/01/2019   Procedure: HOT HEMOSTASIS (ARGON PLASMA COAGULATION/BICAP);  Surgeon: Elicia Claw, MD;  Location: [facility] END[facility]COPY;  Service: Gastroenterology;  Laterality: N/A;   polyp removal     POLYPECTOMY  04/01/2019   Procedure: POLYPECTOMY;  Surgeon: Elicia Claw, MD;  Location: [facility];  Service: Gastroenterology;;     PREVIOUS MEDICATIONS:   CURRENT MEDICATIONS:  Outpatient Encounter Medications as of 12/10/2023  Medication Sig   acetaminophen  (TYLENOL ) 160 MG/5ML suspension Take 240 mg by mouth every 6 (six) hours as needed for mild pain or headache.   amLODipine  (NORVASC ) 2.5 MG tablet Take 2.5 mg by mouth daily.   cetirizine (ZYRTEC) 10 MG tablet Take 10 mg by mouth daily as needed for allergies or rhinitis.   ferrous sulfate  325 (65 FE) MG tablet Take 1 tablet (325 mg total) by mouth daily with breakfast. (Patient taking differently: Take 325 mg by mouth daily with breakfast. Monday , Wednesday , Friday)   memantine  (NAMENDA ) 5 MG tablet TAKE 1 Tablet BY MOUTH TWICE DAILY   METAMUCIL FIBER PO Take 12 g by mouth See admin instructions. Mix 12 grams of powder into 6-8 ounces of apple juice and drink  once a day (Patient taking differently: Take 12 g by mouth as needed. Mix 12 grams of powder into 6-8 ounces of apple juice and drink once a day)   mirtazapine  (REMERON ) 7.5 MG tablet Take 3.75-7.5 mg by mouth See admin instructions. Starting on 07/28/2021, take 3.75 mg by mouth at bedtime and increase to 7.5 mg after 1 week   MUCINEX  FAST-MAX CONGEST COUGH 2.5-5-100 MG/5ML LIQD Take 5 mLs by mouth every 12 (twelve) hours as needed (for coughing).   Multiple Vitamins-Minerals (CENTRUM SILVER) CHEW Chew 1-2 tablets by mouth daily with breakfast.   rosuvastatin  (CRESTOR ) 40 MG tablet TAKE 1 Tablet BY MOUTH ONCE DAILY   senna (SENOKOT) 8.6 MG TABS tablet Take 1 tablet by mouth daily.   febuxostat  (ULORIC ) 40 MG tablet Take 40 mg by mouth daily. (Patient not taking: Reported on 12/10/2023)   No facility-administered encounter medications on file as of 12/10/2023.     Objective:     PHYSICAL EXAMINATION:    VITALS:   Vitals:   12/10/23 1254  BP: 111/61  Pulse: 75  SpO2: 96%  Weight: 158 lb (71.7 kg)  Height: 6' (1.829 m)    GEN:  The patient appears stated age and is in NAD. HEENT:  Normocephalic, atraumatic.   Neurological examination:  General: NAD, well-groomed, appears stated age. Orientation: The patient is alert. Oriented to person, place and not to date. Cranial nerves: There is good facial symmetry.The speech is fluent and clear. No aphasia or dysarthria. Fund of knowledge is appropriate. Recent memory impaired and remote memory is normal.  Attention and concentration are normal.  Able to name objects and repeat phrases.  Hearing is intact to conversational tone .   Delayed recall 2/3 Sensation: Sensation is intact to light touch throughout Motor: Strength is at least antigravity x4. DTR's 2/4 in UE/LE      02/24/2022    2:00 PM  Montreal Cognitive Assessment   Visuospatial/ Executive (0/5) 4  Naming (0/3) 2  Attention: Read list of digits (0/2) 2  Attention: Read list  of letters (0/1) 1  Attention: Serial 7 subtraction starting at 100 (0/3) 1  Language: Repeat phrase (0/2) 2  Language : Fluency (0/1) 0  Abstraction (0/2) 2  Delayed Recall (0/5) 2  Orientation (0/6) 6  Total 22  Adjusted Score (based on education) 22       12/10/2023    6:00 PM 06/09/2023   11:00 AM 10/16/2022    3:00 PM  MMSE - Mini Mental State Exam  Orientation to time 2 3 4   Orientation to Place 5 5 5   Registration 3 3 3   Attention/ Calculation 5 5 5   Recall 2 1 3   Language- name 2 objects 2 2 2   Language- repeat 1 1 1   Language- follow 3 step command 3 3 3   Language- read & follow direction 1 1 1   Write a sentence 1 1 1   Copy design 1 1 1   Total score 26 26 29        Movement examination: Tone: There is normal tone in the UE/LE Abnormal movements:  no tremor.  No myoclonus.  No asterixis.   Coordination:  There is no decremation with RAM's. Normal finger to nose  Gait and Station: The patient has no difficulty arising out of a deep-seated chair without the use of the hands. The patient's stride length is good.  Gait is cautious and narrow.   Thank you for allowing us  the opportunity to participate in the care of this nice patient. Please do not hesitate to contact us  for any questions or concerns.   Total time spent on today's visit was 30 minutes dedicated to this patient today, preparing to see patient, examining the patient, ordering tests and/or medications and counseling the patient, documenting clinical information in the EHR or other health record, independently interpreting results and communicating results to the patient/family, discussing treatment and goals, answering patient's questions and coordinating care.  Cc:  Leontine Cramp, NP  Camie Sevin 12/10/2023 6:48 PM

## 2023-12-28 ENCOUNTER — Inpatient Hospital Stay: Payer: Medicare (Managed Care) | Attending: Internal Medicine

## 2023-12-28 ENCOUNTER — Inpatient Hospital Stay (HOSPITAL_BASED_OUTPATIENT_CLINIC_OR_DEPARTMENT_OTHER): Payer: Medicare (Managed Care) | Admitting: Internal Medicine

## 2023-12-28 VITALS — BP 134/76 | HR 73 | Temp 97.7°F | Resp 17 | Ht 72.0 in | Wt 160.0 lb

## 2023-12-28 DIAGNOSIS — D500 Iron deficiency anemia secondary to blood loss (chronic): Secondary | ICD-10-CM

## 2023-12-28 DIAGNOSIS — K5520 Angiodysplasia of colon without hemorrhage: Secondary | ICD-10-CM | POA: Insufficient documentation

## 2023-12-28 LAB — CBC WITH DIFFERENTIAL (CANCER CENTER ONLY)
Abs Immature Granulocytes: 0.02 K/uL (ref 0.00–0.07)
Basophils Absolute: 0 K/uL (ref 0.0–0.1)
Basophils Relative: 1 %
Eosinophils Absolute: 0.3 K/uL (ref 0.0–0.5)
Eosinophils Relative: 4 %
HCT: 32 % — ABNORMAL LOW (ref 39.0–52.0)
Hemoglobin: 10.5 g/dL — ABNORMAL LOW (ref 13.0–17.0)
Immature Granulocytes: 0 %
Lymphocytes Relative: 35 %
Lymphs Abs: 2.3 K/uL (ref 0.7–4.0)
MCH: 27.8 pg (ref 26.0–34.0)
MCHC: 32.8 g/dL (ref 30.0–36.0)
MCV: 84.7 fL (ref 80.0–100.0)
Monocytes Absolute: 0.4 K/uL (ref 0.1–1.0)
Monocytes Relative: 7 %
Neutro Abs: 3.6 K/uL (ref 1.7–7.7)
Neutrophils Relative %: 53 %
Platelet Count: 165 K/uL (ref 150–400)
RBC: 3.78 MIL/uL — ABNORMAL LOW (ref 4.22–5.81)
RDW: 14.8 % (ref 11.5–15.5)
WBC Count: 6.8 K/uL (ref 4.0–10.5)
nRBC: 0 % (ref 0.0–0.2)

## 2023-12-28 LAB — IRON AND IRON BINDING CAPACITY (CC-WL,HP ONLY)
Iron: 60 ug/dL (ref 45–182)
Saturation Ratios: 17 % — ABNORMAL LOW (ref 17.9–39.5)
TIBC: 350 ug/dL (ref 250–450)
UIBC: 290 ug/dL (ref 117–376)

## 2023-12-28 LAB — FERRITIN: Ferritin: 111 ng/mL (ref 24–336)

## 2023-12-28 NOTE — Progress Notes (Signed)
 [facility] [phone]   [phone]  OFFICE PROGRESS NOTE  Stephen Cramp, NP [address] 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  PRIOR THERAPY: Iron  infusion with Ferrlecit  weekly for 4 weeks started April 15, 2019  CURRENT THERAPY: Ferrous sulfate  325 mg p.o. every other day. INTERVAL HISTORY: Stephen Chavez 89 y.o. male returns to the clinic today for follow-up visit accompanied by his wife.Discussed the use of AI scribe software for clinical note transcription with the patient, who gave verbal consent to proceed.  History of Present Illness Stephen Chavez is an 89 year old male who presents for evaluation and repeat blood work. He is accompanied by his wife.  He feels 'pretty good' overall but notes a slight decrease in energy levels. He is currently taking an iron  supplement three times a week, one day on and one day off, with orange juice or vitamin C to aid absorption. No nausea, vomiting, or constipation related to the iron  supplement. His energy levels are described as 'pretty good,' and he denies any dizzy spells.  His hemoglobin was 9.6 g/dL in January and is now 89.4 g/dL. He continues to take ferrous sulfate  every other day with orange juice.  He occasionally experiences constipation, which may be alleviated by magnesium supplementation, although he is cautious about not taking too much. He has not experienced significant weight changes recently.  No nausea, vomiting, constipation, or dizzy spells.     MEDICAL HISTORY: Past Medical History:  Diagnosis Date   Amaurosis fugax of right eye 09/03/2016   CAD in native artery 07/05/2021   Carotid stenosis 09/03/2016   Mild 05/2016   Chronic combined systolic and diastolic heart failure (HCC) 10/23/2016   Essential hypertension 09/03/2016   Hyperlipidemia    Hypertension     Memory loss 02/18/2022    ALLERGIES:  is allergic to allopurinol , colchicine, and febuxostat .  MEDICATIONS:  Current Outpatient Medications  Medication Sig Dispense Refill   acetaminophen  (TYLENOL ) 160 MG/5ML suspension Take 240 mg by mouth every 6 (six) hours as needed for mild pain or headache.     amLODipine  (NORVASC ) 2.5 MG tablet Take 2.5 mg by mouth daily.     cetirizine (ZYRTEC) 10 MG tablet Take 10 mg by mouth daily as needed for allergies or rhinitis.     febuxostat  (ULORIC ) 40 MG tablet Take 40 mg by mouth daily. (Patient not taking: Reported on 12/10/2023)     ferrous sulfate  325 (65 FE) MG tablet Take 1 tablet (325 mg total) by mouth daily with breakfast. (Patient taking differently: Take 325 mg by mouth daily with breakfast. Monday , Wednesday , Friday)  3   memantine  (NAMENDA ) 5 MG tablet TAKE 1 Tablet BY MOUTH TWICE DAILY 60 tablet 2   METAMUCIL FIBER PO Take 12 g by mouth See admin instructions. Mix 12 grams of powder into 6-8 ounces of apple juice and drink once a day (Patient taking differently: Take 12 g by mouth as needed. Mix 12 grams of powder into 6-8 ounces of apple juice and drink once a day)     mirtazapine  (REMERON ) 7.5 MG tablet Take 3.75-7.5 mg by mouth See admin instructions. Starting on 07/28/2021, take 3.75 mg by mouth at bedtime and increase to 7.5 mg after 1 week     MUCINEX  FAST-MAX CONGEST COUGH 2.5-5-100 MG/5ML LIQD Take 5 mLs by mouth every 12 (twelve)  hours as needed (for coughing).     Multiple Vitamins-Minerals (CENTRUM SILVER) CHEW Chew 1-2 tablets by mouth daily with breakfast.     rosuvastatin  (CRESTOR ) 40 MG tablet TAKE 1 Tablet BY MOUTH ONCE DAILY 90 tablet 3   senna (SENOKOT) 8.6 MG TABS tablet Take 1 tablet by mouth daily.     No current facility-administered medications for this visit.    SURGICAL HISTORY:  Past Surgical History:  Procedure Laterality Date   BIOPSY  04/01/2019   Procedure: BIOPSY;  Surgeon: Elicia Claw, MD;  Location: [facility]  ENDOSCOPY;  Service: Gastroenterology;;   COLONOSCOPY WITH PROPOFOL  N/A 04/01/2019   Procedure: COLONOSCOPY WITH PROPOFOL ;  Surgeon: Elicia Claw, MD;  Location: [facility] ENDOSCOPY;  Service: Gastroenterology;  Laterality: N/A;   ENTEROSCOPY N/A 04/01/2019   Procedure: Push ENTEROSCOPY;  Surgeon: Elicia Claw, MD;  Location: [facility] ENDOSCOPY;  Service: Gastroenterology;  Laterality: N/A;   EYE SURGERY     HOT HEMOSTASIS N/A 04/01/2019   Procedure: HOT HEMOSTASIS (ARGON PLASMA COAGULATION/BICAP);  Surgeon: Elicia Claw, MD;  Location[facility] ENDOSCOPY;  Service: Gastroenterology;  Laterality: N/A;   polyp removal     POLYPECTOMY  04/01/2019   Procedure: POLYPECTOMY;  Surgeon: Elicia Claw, MD;  Location: [facility] ENDOSCOPY;  Service: Gastroenterology;;    REVIEW OF SYSTEMS:  A comprehensive review of systems was negative except for: Constitutional: positive for fatigue   PHYSICAL EXAMINATION: General appearance: alert, cooperative, fatigued, and no distress Head: Normocephalic, without obvious abnormality, atraumatic Neck: no adenopathy, no JVD, supple, symmetrical, trachea midline, and thyroid  not enlarged, symmetric, no tenderness/mass/nodules Lymph nodes: Cervical, supraclavicular, and axillary nodes normal. Resp: clear to auscultation bilaterally Back: symmetric, no curvature. ROM normal. No CVA tenderness. Cardio: regular rate and rhythm, S1, S2 normal, no murmur, click, rub or gallop GI: soft, non-tender; bowel sounds normal; no masses,  no organomegaly Extremities: extremities normal, atraumatic, no cyanosis or edema  ECOG PERFORMANCE STATUS: 1 - Symptomatic but completely ambulatory  Blood pressure 134/76, pulse 73, temperature 97.7 F (36.5 C), temperature source Temporal, resp. rate 17, height 6' (1.829 m), weight 160 lb (72.6 kg), SpO2 97%.  LABORATORY DATA: Lab Results  Component Value Date   WBC 6.8 12/28/2023   HGB 10.5 (L) 12/28/2023   HCT 32.0 (L) 12/28/2023   MCV  84.7 12/28/2023   PLT 165 12/28/2023      Chemistry      Component Value Date/Time   NA 138 08/01/2021 0220   K 4.6 08/01/2021 0220   CL 104 08/01/2021 0220   CO2 23 08/01/2021 0220   BUN 18 08/01/2021 0220   CREATININE 2.05 (H) 08/01/2021 0220   CREATININE 1.94 (H) 12/22/2019 1122      Component Value Date/Time   CALCIUM  9.5 08/01/2021 0220   ALKPHOS 59 08/01/2021 0220   AST 17 08/01/2021 0220   AST 13 (L) 12/22/2019 1122   ALT 11 08/01/2021 0220   ALT 17 12/22/2019 1122   BILITOT 0.6 08/01/2021 0220   BILITOT 0.4 12/22/2019 1122       RADIOGRAPHIC STUDIES: No results found.  ASSESSMENT AND PLAN: This is a very pleasant 89 years old African-American male with history of anemia of chronic disease plus iron  deficiency anemia secondary to gastrointestinal blood loss from AV malformation. The patient was treated with intravenous iron  infusion with Ferrlecit  weekly for 4 weeks.  Last treatment was in November 2020. He is currently on over-the-counter ferrous sulfate  325 mg p.o. every other day. Assessment and Plan Assessment & Plan Iron   deficiency anemia Chronic iron  deficiency anemia with improvement in hemoglobin levels from 9.6 g/dL in January to 89.4 g/dL currently, indicating a positive response to the current iron  supplementation regimen. No reported side effects such as nausea, vomiting, or constipation from the iron  supplement. Energy levels are good, though not optimal for his age. No dizziness reported. He is compliant with taking ferrous sulfate  every other day with vitamin C to enhance absorption. - Continue ferrous sulfate  every other day with orange juice or vitamin C to enhance absorption. - Schedule follow-up appointment in six months. The patient was advised to call immediately if he has any concerning symptoms in the interval. The patient voices understanding of current disease status and treatment options and is in agreement with the current care plan. All  questions were answered. The patient knows to call the clinic with any problems, questions or concerns. We can certainly see the patient much sooner if necessary.  Disclaimer: This note was dictated with voice recognition software. Similar sounding words can inadvertently be transcribed and may not be corrected upon review.

## 2023-12-29 ENCOUNTER — Other Ambulatory Visit: Payer: Self-pay | Admitting: Physician Assistant

## 2024-02-01 ENCOUNTER — Ambulatory Visit: Admitting: Physician Assistant

## 2024-03-22 ENCOUNTER — Encounter: Payer: Self-pay | Admitting: Internal Medicine

## 2024-03-23 ENCOUNTER — Other Ambulatory Visit: Payer: Self-pay | Admitting: Physician Assistant

## 2024-03-25 ENCOUNTER — Encounter (HOSPITAL_BASED_OUTPATIENT_CLINIC_OR_DEPARTMENT_OTHER): Payer: Self-pay | Admitting: Family

## 2024-03-25 ENCOUNTER — Ambulatory Visit (HOSPITAL_BASED_OUTPATIENT_CLINIC_OR_DEPARTMENT_OTHER): Payer: Medicare (Managed Care) | Admitting: Family

## 2024-03-25 VITALS — BP 112/60 | HR 66 | Resp 17 | Ht 72.0 in | Wt 163.0 lb

## 2024-03-25 DIAGNOSIS — E785 Hyperlipidemia, unspecified: Secondary | ICD-10-CM | POA: Diagnosis not present

## 2024-03-25 DIAGNOSIS — I10 Essential (primary) hypertension: Secondary | ICD-10-CM

## 2024-03-25 DIAGNOSIS — R5381 Other malaise: Secondary | ICD-10-CM

## 2024-03-25 LAB — LIPID PANEL
Chol/HDL Ratio: 3.8 ratio (ref 0.0–5.0)
Cholesterol, Total: 140 mg/dL (ref 100–199)
HDL: 37 mg/dL — ABNORMAL LOW (ref >39)
LDL Chol Calc (NIH): 80 mg/dL (ref 0–99)
Triglycerides: 127 mg/dL (ref 0–149)
VLDL Cholesterol Cal: 23 mg/dL (ref 5–40)

## 2024-03-25 LAB — ALT: ALT: 15 IU/L (ref 0–44)

## 2024-03-25 NOTE — Progress Notes (Signed)
 Cardiology Office Note:  .   Date:  03/25/2024  ID:  Stephen Chavez, DOB 17-May-1933, MRN [id number] PCP: Leontine Cramp, NP  [facility] Providers Cardiologist:  Annabella Scarce, MD    Nephrology: Dr. Dolan History of Present Illness: .   Stephen Chavez is a 89 y.o. 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   Prior transient monocular visual loss right eye 05/2016 which lasted several minutes which has recurred 3-4 times.  No pathology noted by ophthalmologist.  Felt to be due to amaurosis fugax and TIA evaluation recommended.  Carotid Doppler revealed mild carotid stenosis bilaterally.  Aspirin  increased to 325 mg daily.  Referred to cardiology for evaluation with echo bubble 2017 EF 45-50% with diffuse hypokinesis and grade 1 diastolic dysfunction, moderate TR, negative for atrial shunting.  3-day monitor with no significant arrhythmias.  BP poorly controlled amlodipine  increased.  He was subsequently lost to follow-up until 07/05/2021.  He did have echocardiogram in the interim 06/2018 with EF 60 to 65%, grade 1 diastolic dysfunction, PASP 40 mmHg.   He had bilateral PE 06/2018.  He was noted calcification of the LAD on CT chest at that time.  Last seen in clinic 07/05/2021.  Repeat echocardiogram ordered to assess shortness of breath and lower extremity edema. It showed normal LVEF 55-60%, severe asymmetric LVH, no RWMA, gr1DD, trivial MR/AI. Myoview  ordered to assess for ischemia which was low risk.    Admitted 2/14 - 08/01/2021 with generalized weakness.  Appear to be primarily related to dehydration, poor dietary intake, likely clinical depression related to loss of son a few months prior.  He did have AKI.  VQ scan and venous duplex without evidence of thrombosis despite elevated D-dimer.  He was provided IV iron  in hospital stay due to chronic iron  deficiency anemia.  At  visit 10/2021 referred to neurology due to episodes of confusion.  Subsequently started on memantine  5 mg nightly.   He was last seen 05/25/2023 by Dr. Scarce.  She is to complain of constipation.  Pancreatic cyst had shown slight growth but he had elected to decline further testing.  Blood pressure fluctuating with adjustment of amlodipine  to 2.5 mg daily.  His BP controlled in clinic this dose was continued.  Updated lipid panel ordered but not collected.  Presents today for follow up with his wife. He is predominantly sedentary in the bed throughout the day. In discussing exercise he notes concern about feeling unsteady on his feet. Notes nephrology is considering HD due to decline in renal function. Reports no shortness of breath nor dyspnea on exertion. Reports no chest pain, pressure, or tightness. No edema, orthopnea, PND. Reports no palpitations.    ROS: Please see the history of present illness.    All other systems reviewed and are negative.   Studies Reviewed: SABRA    EKG Interpretation Date/Time:  Friday March 25 2024 09:31:47 EDT Ventricular Rate:  68 PR Interval:  184 QRS Duration:  94 QT Interval:  426 QTC Calculation: 452 R Axis:   -53  Text Interpretation: Normal sinus rhythm Left anterior fascicular block Confirmed by Vannie Mora ([id number]) on 03/25/2024 9:34:57 AM    Cardiac Studies & Procedures   ______________________________________________________________________________________________   STRESS TESTS  MYOCARDIAL PERFUSION IMAGING 07/22/2021  Interpretation Summary   Findings are consistent with no prior ischemia. The study is low risk.   No ST  deviation was noted.   Left ventricular function is normal. Nuclear stress EF: 59 %. The left ventricular ejection fraction is normal (55-65%). End diastolic cavity size is normal. End systolic cavity size is normal.   Prior study available for comparison from 10/16/2016. No changes compared to prior study.  Findings: No  evidence of ischemia. There is prominent subdiaphragmatic attenuation. There is an inferior perfusion defect in rest that improves with stress consistent with attenuation artifact.  Conclusions: Stress test is negative for ischemia Low risk study. No changes from 2018.   ECHOCARDIOGRAM  ECHOCARDIOGRAM COMPLETE 07/22/2021  Narrative ECHOCARDIOGRAM REPORT    Patient Name:   Stephen Chavez  Date of Exam: 07/22/2021 Medical Rec #:  [id number]     Height:       71.0 in Accession #:    [id number]    Weight:       174.9 lb Date of Birth:  March 24, 1933     BSA:          1.992 m Patient Age:    88 years      BP:           122/74 mmHg Patient Gender: M             HR:           77 bpm. Exam Location:  [address]  Procedure: 2D Echo, Cardiac Doppler and Color Doppler  Indications:    R06.02 SOB  History:        Patient has prior history of Echocardiogram examinations, most recent 06/28/2018. CHF, CAD, Signs/Symptoms:Shortness of Breath; Risk Factors:Hypertension and Dyslipidemia.  Sonographer:    Elsie Bohr RDCS Referring Phys: [id number] TIFFANY [place]  IMPRESSIONS   1. Left ventricular ejection fraction, by estimation, is 55 to 60%. The left ventricle has normal function. The left ventricle has no regional wall motion abnormalities. There is severe asymmetric left ventricular hypertrophy of the basal-septal segment. Left ventricular diastolic parameters are consistent with Grade I diastolic dysfunction (impaired relaxation). 2. Right ventricular systolic function is normal. The right ventricular size is normal. There is normal pulmonary artery systolic pressure. The estimated right ventricular systolic pressure is 34.8 mmHg. 3. The mitral valve is normal in structure. Trivial mitral valve regurgitation. No evidence of mitral stenosis. 4. The aortic valve is tricuspid. Aortic valve regurgitation is trivial. Aortic valve sclerosis is present, with no evidence of aortic valve  stenosis. 5. The inferior vena cava is normal in size with greater than 50% respiratory variability, suggesting right atrial pressure of 3 mmHg.  FINDINGS Left Ventricle: Left ventricular ejection fraction, by estimation, is 55 to 60%. The left ventricle has normal function. The left ventricle has no regional wall motion abnormalities. The left ventricular internal cavity size was normal in size. There is severe asymmetric left ventricular hypertrophy of the basal-septal segment. Left ventricular diastolic parameters are consistent with Grade I diastolic dysfunction (impaired relaxation).  Right Ventricle: The right ventricular size is normal. No increase in right ventricular wall thickness. Right ventricular systolic function is normal. There is normal pulmonary artery systolic pressure. The tricuspid regurgitant velocity is 2.82 m/s, and with an assumed right atrial pressure of 3 mmHg, the estimated right ventricular systolic pressure is 34.8 mmHg.  Left Atrium: Left atrial size was normal in size.  Right Atrium: Right atrial size was normal in size.  Pericardium: There is no evidence of pericardial effusion.  Mitral Valve: The mitral valve is normal in structure. Trivial mitral valve regurgitation. No evidence  of mitral valve stenosis.  Tricuspid Valve: The tricuspid valve is normal in structure. Tricuspid valve regurgitation is mild.  Aortic Valve: The aortic valve is tricuspid. Aortic valve regurgitation is trivial. Aortic valve sclerosis is present, with no evidence of aortic valve stenosis.  Pulmonic Valve: The pulmonic valve was not well visualized. Pulmonic valve regurgitation is trivial.  Aorta: The aortic root and ascending aorta are structurally normal, with no evidence of dilitation.  Venous: The inferior vena cava is normal in size with greater than 50% respiratory variability, suggesting right atrial pressure of 3 mmHg.  IAS/Shunts: The interatrial septum was not well  visualized.   LEFT VENTRICLE PLAX 2D LVIDd:         4.10 cm   Diastology LVIDs:         2.70 cm   LV e' medial:    4.79 cm/s LV PW:         1.20 cm   LV E/e' medial:  12.0 LV IVS:        1.80 cm   LV e' lateral:   6.53 cm/s LVOT diam:     2.30 cm   LV E/e' lateral: 8.8 LV SV:         65 LV SV Index:   33 LVOT Area:     4.15 cm   RIGHT VENTRICLE             IVC RV S prime:     17.20 cm/s  IVC diam: 1.30 cm TAPSE (M-mode): 1.9 cm RVSP:           34.8 mmHg  LEFT ATRIUM             Index        RIGHT ATRIUM           Index LA diam:        3.30 cm 1.66 cm/m   RA Pressure: 3.00 mmHg LA Vol (A2C):   45.0 ml 22.59 ml/m  RA Area:     12.50 cm LA Vol (A4C):   34.4 ml 17.27 ml/m  RA Volume:   32.60 ml  16.37 ml/m LA Biplane Vol: 40.4 ml 20.28 ml/m AORTIC VALVE LVOT Vmax:   81.40 cm/s LVOT Vmean:  55.900 cm/s LVOT VTI:    0.157 m  AORTA Ao Root diam: 3.80 cm Ao Asc diam:  3.60 cm  MITRAL VALVE                TRICUSPID VALVE MV Area (PHT): 1.98 cm     TR Peak grad:   31.8 mmHg MV Decel Time: 384 msec     TR Vmax:        282.00 cm/s MV E velocity: 57.60 cm/s   Estimated RAP:  3.00 mmHg MV A velocity: 108.00 cm/s  RVSP:           34.8 mmHg MV E/A ratio:  0.53 SHUNTS Systemic VTI:  0.16 m Systemic Diam: 2.30 cm  Lonni Nanas MD Electronically signed by Lonni Nanas MD Signature Date/Time: 07/22/2021/2:17:52 PM    Final    MONITORS  CARDIAC EVENT MONITOR 09/23/2016  Narrative 30 Day Event Monitor  Quality: Fair.  Baseline artifact. Predominant rhythm: sinus rhythm Average heart rate: 73 bpm Minimum heart rate 59 bpm Maximum heart rate 99 bpm  No arrhythmias noted Tiffany C. Raford, MD, [facility] 10/23/2016 1:14 PM       ______________________________________________________________________________________________       Risk Assessment/Calculations:  Physical Exam:   VS:  BP 112/60 (BP Location: Right Arm, Patient Position:  Sitting, Cuff Size: Normal)   Pulse 66   Resp 17   Ht 6' (1.829 m)   Wt 163 lb (73.9 kg)   SpO2 94%   BMI 22.11 kg/m    Wt Readings from Last 3 Encounters:  03/25/24 163 lb (73.9 kg)  12/28/23 160 lb (72.6 kg)  12/10/23 158 lb (71.7 kg)    GEN: Well nourished, well developed in no acute distress NECK: No JVD; No carotid bruits CARDIAC: RRR, no murmurs, rubs, gallops RESPIRATORY:  Clear to auscultation without rales, wheezing or rhonchi  ABDOMEN: Soft, non-tender, non-distended EXTREMITIES:  No edema; No deformity   ASSESSMENT AND PLAN: .    HFrecEF / Physical deconditioning - Euvolemic and well compensated on exam. Overall sedentary. Referred to [facility] PT for physical deconditioning. No indication for loop diuretic. Previous GDMT reduced due to relative hypotension.   HTN - BP well controlled. Continue current antihypertensive regimen Amlodipine  2.5mg  daily.   HLD / CAD - Stable with no anginal symptoms. No indication for ischemic evaluation.  Update lipid panel, ALT.         Dispo: follow up in 6 months with Dr. Raford or APP  Signed, Reche GORMAN Finder, NP

## 2024-03-25 NOTE — Patient Instructions (Addendum)
 Medication Instructions:   Your physician recommends that you continue on your current medications as directed. Please refer to the Current Medication list given to you today.   *If you need a refill on your cardiac medications before your next appointment, please call your pharmacy*  Lab Work:  TODAY!!! LIPID/ALT  If you have labs (blood work) drawn today and your tests are completely normal, you will receive your results only by: MyChart Message (if you have MyChart) OR A paper copy in the mail If you have any lab test that is abnormal or we need to change your treatment, we will call you to review the results.  Testing/Procedures:  None ordered.   Follow-Up: At [facility], you and your health needs are our priority.  As part of our continuing mission to provide you with exceptional heart care, our providers are all part of one team.  This team includes your primary Cardiologist (physician) and Advanced Practice Providers or APPs (Physician Assistants and Nurse Practitioners) who all work together to provide you with the care you need, when you need it.  Your next appointment:   6 month(s)  Provider:   Annabella Scarce, MD, Rosaline Bane, NP, or Reche Finder, NP    We recommend signing up for the patient portal called MyChart.  Sign up information is provided on this After Visit Summary.  MyChart is used to connect with patients for Virtual Visits (Telemedicine).  Patients are able to view lab/test results, encounter notes, upcoming appointments, etc.  Non-urgent messages can be sent to your provider as well.   To learn more about what you can do with MyChart, go to [email or web address].   Other Instructions  Your physician wants you to follow-up in: 6 months. You will receive a reminder letter in the mail two months in advance. If you don't receive a letter, please call our office to schedule the follow-up appointment.    You have been referred to Home  health PT.  The office will call you to schedule you.

## 2024-03-28 ENCOUNTER — Ambulatory Visit (HOSPITAL_BASED_OUTPATIENT_CLINIC_OR_DEPARTMENT_OTHER): Payer: Self-pay | Admitting: Family

## 2024-03-28 DIAGNOSIS — E785 Hyperlipidemia, unspecified: Secondary | ICD-10-CM

## 2024-03-28 DIAGNOSIS — I25118 Atherosclerotic heart disease of native coronary artery with other forms of angina pectoris: Secondary | ICD-10-CM

## 2024-03-28 DIAGNOSIS — I2510 Atherosclerotic heart disease of native coronary artery without angina pectoris: Secondary | ICD-10-CM

## 2024-03-28 DIAGNOSIS — Z79899 Other long term (current) drug therapy: Secondary | ICD-10-CM

## 2024-03-28 MED ORDER — EZETIMIBE 10 MG PO TABS: 10.0000 mg | ORAL_TABLET | Freq: Every day | ORAL | 1 refills | Status: DC

## 2024-03-28 NOTE — Telephone Encounter (Signed)
 The patients Wife Stephen Chavez (on [place]) has been notified of the result and verbalized understanding.  All questions (if any) were answered.  Stephen Chavez is aware we will add zetia 10 mg po daily to his regimen and he should continue his current dose of crestor  40 mg po daily.  Stephen Chavez is aware the pt will need to return for repeat lab work to check lipids/ALT in 2-3 months.  Advised her to write this down on the calendar for the pt.   Stephen Chavez is aware he will need to come fasting to this lab appointment.   Confirmed the pharmacy of choice with the pts wife.   Stephen Chavez verbalized understanding and agrees with this plan.

## 2024-03-28 NOTE — Telephone Encounter (Signed)
-----   Message from Reche GORMAN Finder sent at 03/28/2024  8:15 AM EDT ----- Normal liver enzyme.  Cholesterol panel with LDL (bad cholesterol) of 80 which is not at goal of less than 70.  Recommend addition of Zetia 10 mg daily.  Continue rosuvastatin  40 mg daily.  Repeat  fasting lipid panel/ALT in 2 to 3 months. ----- Message ----- From: Interface, [facility] Lab Results In Sent: 03/25/2024  11:36 PM EDT To: Reche GORMAN Finder, NP

## 2024-06-06 ENCOUNTER — Ambulatory Visit: Payer: Medicare (Managed Care) | Admitting: Physician Assistant

## 2024-06-15 ENCOUNTER — Other Ambulatory Visit (HOSPITAL_BASED_OUTPATIENT_CLINIC_OR_DEPARTMENT_OTHER): Payer: Self-pay | Admitting: Cardiovascular Disease

## 2024-06-15 ENCOUNTER — Other Ambulatory Visit (HOSPITAL_BASED_OUTPATIENT_CLINIC_OR_DEPARTMENT_OTHER): Payer: Self-pay | Admitting: Family

## 2024-06-15 DIAGNOSIS — E785 Hyperlipidemia, unspecified: Secondary | ICD-10-CM

## 2024-06-15 DIAGNOSIS — I2510 Atherosclerotic heart disease of native coronary artery without angina pectoris: Secondary | ICD-10-CM

## 2024-06-15 DIAGNOSIS — Z79899 Other long term (current) drug therapy: Secondary | ICD-10-CM

## 2024-06-15 DIAGNOSIS — I25118 Atherosclerotic heart disease of native coronary artery with other forms of angina pectoris: Secondary | ICD-10-CM

## 2024-06-29 ENCOUNTER — Inpatient Hospital Stay: Payer: Medicare (Managed Care) | Attending: Internal Medicine

## 2024-06-29 ENCOUNTER — Inpatient Hospital Stay: Payer: Medicare (Managed Care) | Admitting: Internal Medicine

## 2024-06-29 VITALS — BP 114/76 | HR 74 | Temp 97.7°F | Resp 17 | Ht 72.0 in | Wt 161.0 lb

## 2024-06-29 DIAGNOSIS — D631 Anemia in chronic kidney disease: Secondary | ICD-10-CM | POA: Insufficient documentation

## 2024-06-29 DIAGNOSIS — I130 Hypertensive heart and chronic kidney disease with heart failure and stage 1 through stage 4 chronic kidney disease, or unspecified chronic kidney disease: Secondary | ICD-10-CM | POA: Diagnosis not present

## 2024-06-29 DIAGNOSIS — D508 Other iron deficiency anemias: Secondary | ICD-10-CM

## 2024-06-29 DIAGNOSIS — D500 Iron deficiency anemia secondary to blood loss (chronic): Secondary | ICD-10-CM | POA: Diagnosis present

## 2024-06-29 DIAGNOSIS — K922 Gastrointestinal hemorrhage, unspecified: Secondary | ICD-10-CM | POA: Insufficient documentation

## 2024-06-29 DIAGNOSIS — N189 Chronic kidney disease, unspecified: Secondary | ICD-10-CM | POA: Diagnosis not present

## 2024-06-29 DIAGNOSIS — I5042 Chronic combined systolic (congestive) and diastolic (congestive) heart failure: Secondary | ICD-10-CM | POA: Insufficient documentation

## 2024-06-29 DIAGNOSIS — Z79899 Other long term (current) drug therapy: Secondary | ICD-10-CM | POA: Diagnosis not present

## 2024-06-29 DIAGNOSIS — Q2730 Arteriovenous malformation, site unspecified: Secondary | ICD-10-CM | POA: Insufficient documentation

## 2024-06-29 LAB — CBC WITH DIFFERENTIAL (CANCER CENTER ONLY)
Abs Immature Granulocytes: 0.01 K/uL (ref 0.00–0.07)
Basophils Absolute: 0 K/uL (ref 0.0–0.1)
Basophils Relative: 0 %
Eosinophils Absolute: 0.2 K/uL (ref 0.0–0.5)
Eosinophils Relative: 4 %
HCT: 30.9 % — ABNORMAL LOW (ref 39.0–52.0)
Hemoglobin: 10.2 g/dL — ABNORMAL LOW (ref 13.0–17.0)
Immature Granulocytes: 0 %
Lymphocytes Relative: 37 %
Lymphs Abs: 2.3 K/uL (ref 0.7–4.0)
MCH: 27.9 pg (ref 26.0–34.0)
MCHC: 33 g/dL (ref 30.0–36.0)
MCV: 84.4 fL (ref 80.0–100.0)
Monocytes Absolute: 0.4 K/uL (ref 0.1–1.0)
Monocytes Relative: 6 %
Neutro Abs: 3.3 K/uL (ref 1.7–7.7)
Neutrophils Relative %: 53 %
Platelet Count: 197 K/uL (ref 150–400)
RBC: 3.66 MIL/uL — ABNORMAL LOW (ref 4.22–5.81)
RDW: 14.7 % (ref 11.5–15.5)
WBC Count: 6.3 K/uL (ref 4.0–10.5)
nRBC: 0 % (ref 0.0–0.2)

## 2024-06-29 LAB — IRON AND IRON BINDING CAPACITY (CC-WL,HP ONLY)
Iron: 57 ug/dL (ref 45–182)
Saturation Ratios: 16 % — ABNORMAL LOW (ref 17.9–39.5)
TIBC: 364 ug/dL (ref 250–450)
UIBC: 307 ug/dL

## 2024-06-29 LAB — FERRITIN: Ferritin: 73 ng/mL (ref 24–336)

## 2024-06-29 NOTE — Progress Notes (Signed)
 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  PRIOR THERAPY: Iron  infusion with Ferrlecit  weekly for 4 weeks started April 15, 2019  CURRENT THERAPY: Ferrous sulfate  325 mg p.o. every other day. INTERVAL HISTORY: Stephen Chavez 89 y.o. male returns to the clinic today for follow-up visit accompanied by his wife.Discussed the use of AI scribe software for clinical note transcription with the patient, who gave verbal consent to proceed.  History of Present Illness Stephen Chavez is an 89 year old male with iron  deficiency anemia secondary to gastrointestinal blood loss who presents for evaluation and repeat blood work.  He has chronic iron  deficiency anemia attributed to gastrointestinal blood loss from arteriovenous malformation and gastropathy. He was previously treated with intravenous iron  and is currently taking oral ferrous sulfate  325 mg every other day, typically with apple juice. He denies fatigue, dizziness, chest pain, dyspnea, nausea, and vomiting. He notes occasional constipation, which he attributes to iron  supplementation. Weight is stable, with only a one-pound decrease since the last visit.  He recently followed up with nephrology for chronic kidney disease. He was advised to increase water intake due to prior dehydration and has made some effort to do so.    MEDICAL HISTORY: Past Medical History:  Diagnosis Date   Amaurosis fugax of right eye 09/03/2016   CAD in native artery 07/05/2021   Carotid stenosis 09/03/2016   Mild 05/2016   Chronic combined systolic and diastolic heart failure (HCC) 10/23/2016   Essential hypertension 09/03/2016   Hyperlipidemia    Hypertension    Memory loss 02/18/2022    ALLERGIES:  is allergic to allopurinol ,  colchicine, and febuxostat .  MEDICATIONS:  Current Outpatient Medications  Medication Sig Dispense Refill   acetaminophen  (TYLENOL ) 160 MG/5ML suspension Take 240 mg by mouth every 6 (six) hours as needed for mild pain or headache.     amLODipine  (NORVASC ) 2.5 MG tablet Take 2.5 mg by mouth daily.     cetirizine (ZYRTEC) 10 MG tablet Take 10 mg by mouth daily as needed for allergies or rhinitis.     ezetimibe  (ZETIA ) 10 MG tablet TAKE 1 Tablet BY MOUTH ONCE DAILY 90 tablet 3   ferrous sulfate  325 (65 FE) MG tablet Take 1 tablet (325 mg total) by mouth daily with breakfast.  3   memantine  (NAMENDA ) 5 MG tablet TAKE 1 Tablet BY MOUTH TWICE DAILY 60 tablet 2   METAMUCIL FIBER PO Take 12 g by mouth See admin instructions. Mix 12 grams of powder into 6-8 ounces of apple juice and drink once a day     mirtazapine  (REMERON ) 7.5 MG tablet Take 3.75-7.5 mg by mouth See admin instructions. Starting on 07/28/2021, take 3.75 mg by mouth at bedtime and increase to 7.5 mg after 1 week     MUCINEX  FAST-MAX CONGEST COUGH 2.5-5-100 MG/5ML LIQD Take 5 mLs by mouth every 12 (twelve) hours as needed (for coughing).     Multiple Vitamins-Minerals (CENTRUM SILVER) CHEW Chew 1-2 tablets by mouth daily with breakfast.     rosuvastatin  (CRESTOR ) 40 MG tablet TAKE 1 Tablet BY MOUTH ONCE DAILY 90 tablet 3   senna (SENOKOT) 8.6 MG TABS tablet Take 1 tablet by mouth daily.     No current facility-administered medications for this visit.    SURGICAL  HISTORY:  Past Surgical History:  Procedure Laterality Date   BIOPSY  04/01/2019   Procedure: BIOPSY;  Surgeon: Elicia Claw, MD;  Location: [facility];  Service: Gastroenterology;;   COLONOSCOPY WITH PROPOFOL  N/A 04/01/2019   Procedure: COLONOSCOPY WITH PROPOFOL ;  Surgeon: Elicia Claw, MD;  Location: [facility];  Service: Gastroenterology;  Laterality: N/A;   ENTEROSCOPY N/A 04/01/2019   Procedure: Push ENTEROSCOPY;  Surgeon: Elicia Claw, MD;   Location: [facility]OS[facility];  Service: Gastroenterology;  Laterality: N/A;   EYE SURGERY     HOT HEMOSTASIS N/A 04/01/2019   Procedure: HOT HEMOSTASIS (ARGON PLASMA COAGULATION/BICAP);  Surgeon: Elicia Claw, MD;  Location: [facility] ENDOSCOPY;  Service: Gastroenterology;  Laterality: N/A;   polyp removal     POLYPECTOMY  04/01/2019   Procedure: POLYPECTOMY;  Surgeon: Elicia Claw, MD;  Location: [facility] ENDOSCOPY;  Service: Gastroenterology;;    REVIEW OF SYSTEMS:  A comprehensive review of systems was negative except for: Constitutional: positive for fatigue   PHYSICAL EXAMINATION: General appearance: alert, cooperative, fatigued, and no distress Head: Normocephalic, without obvious abnormality, atraumatic Neck: no adenopathy, no JVD, supple, symmetrical, trachea midline, and thyroid  not enlarged, symmetric, no tenderness/mass/nodules Lymph nodes: Cervical, supraclavicular, and axillary nodes normal. Resp: clear to auscultation bilaterally Back: symmetric, no curvature. ROM normal. No CVA tenderness. Cardio: regular rate and rhythm, S1, S2 normal, no murmur, click, rub or gallop GI: soft, non-tender; bowel sounds normal; no masses,  no organomegaly Extremities: extremities normal, atraumatic, no cyanosis or edema  ECOG PERFORMANCE STATUS: 1 - Symptomatic but completely ambulatory  Blood pressure 114/76, pulse 74, temperature 97.7 F (36.5 C), temperature source Temporal, resp. rate 17, height 6' (1.829 m), weight 161 lb (73 kg), SpO2 95%.  LABORATORY DATA: Lab Results  Component Value Date   WBC 6.3 06/29/2024   HGB 10.2 (L) 06/29/2024   HCT 30.9 (L) 06/29/2024   MCV 84.4 06/29/2024   PLT 197 06/29/2024      Chemistry      Component Value Date/Time   NA 138 08/01/2021 0220   K 4.6 08/01/2021 0220   CL 104 08/01/2021 0220   CO2 23 08/01/2021 0220   BUN 18 08/01/2021 0220   CREATININE 2.05 (H) 08/01/2021 0220   CREATININE 1.94 (H) 12/22/2019 1122      Component Value Date/Time    CALCIUM  9.5 08/01/2021 0220   ALKPHOS 59 08/01/2021 0220   AST 17 08/01/2021 0220   AST 13 (L) 12/22/2019 1122   ALT 15 03/25/2024 1025   ALT 17 12/22/2019 1122   BILITOT 0.6 08/01/2021 0220   BILITOT 0.4 12/22/2019 1122       RADIOGRAPHIC STUDIES: No results found.  ASSESSMENT AND PLAN: This is a very pleasant 89 years old African-American male with history of anemia of chronic disease plus iron  deficiency anemia secondary to gastrointestinal blood loss from AV malformation. The patient was treated with intravenous iron  infusion with Ferrlecit  weekly for 4 weeks.  Last treatment was in November 2020. He is currently on over-the-counter ferrous sulfate  325 mg p.o. every other day. Repeat CBC today showed hemoglobin of 10.2 and hematocrit 30.9%.  Iron  study and ferritin are still pending. 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 Hemoglobin remains at 10.2 g/dL, consistent with prior values over the past year. He is asymptomatic with no evidence of acute decompensation. Managed with oral iron   supplementation, with occasional constipation likely secondary to iron  therapy. No other significant symptoms reported. - Continued oral ferrous sulfate  325 mg every other day (three times weekly). - Advised to take iron  with orange juice or vitamin C to enhance absorption. - Ordered repeat blood work, including iron  studies. - If iron  studies are markedly decreased, will arrange for intravenous iron  infusion. - If iron  studies are adequate, will maintain current oral iron  regimen. - Planned follow-up in six months.  He was advised to call immediately if he has any other concerning symptoms in the interval. The patient voices understanding of current disease status and treatment options and  is in agreement with the current care plan. All questions were answered. The patient knows to call the clinic with any problems, questions or concerns. We can certainly see the patient much sooner if necessary.  Disclaimer: This note was dictated with voice recognition software. Similar sounding words can inadvertently be transcribed and may not be corrected upon review.       "

## 2024-08-03 ENCOUNTER — Ambulatory Visit: Payer: Medicare (Managed Care) | Admitting: Physician Assistant

## 2024-12-28 ENCOUNTER — Inpatient Hospital Stay: Payer: Medicare (Managed Care)

## 2024-12-28 ENCOUNTER — Inpatient Hospital Stay: Payer: Medicare (Managed Care) | Admitting: Internal Medicine
